# Patient Record
Sex: Female | Born: 1955
Health system: Southern US, Community
[De-identification: ages and names within clinical notes are randomized; demographics above are authoritative.]

## PROBLEM LIST (undated history)

## (undated) DIAGNOSIS — L709 Acne, unspecified: Secondary | ICD-10-CM

## (undated) DIAGNOSIS — E049 Nontoxic goiter, unspecified: Secondary | ICD-10-CM

## (undated) DIAGNOSIS — M1990 Unspecified osteoarthritis, unspecified site: Secondary | ICD-10-CM

## (undated) DIAGNOSIS — J449 Chronic obstructive pulmonary disease, unspecified: Secondary | ICD-10-CM

## (undated) DIAGNOSIS — N281 Cyst of kidney, acquired: Secondary | ICD-10-CM

## (undated) DIAGNOSIS — E079 Disorder of thyroid, unspecified: Secondary | ICD-10-CM

## (undated) DIAGNOSIS — M8580 Other specified disorders of bone density and structure, unspecified site: Secondary | ICD-10-CM

## (undated) DIAGNOSIS — F419 Anxiety disorder, unspecified: Secondary | ICD-10-CM

## (undated) DIAGNOSIS — J439 Emphysema, unspecified: Secondary | ICD-10-CM

## (undated) DIAGNOSIS — F32A Depression, unspecified: Secondary | ICD-10-CM

## (undated) DIAGNOSIS — F17200 Nicotine dependence, unspecified, uncomplicated: Secondary | ICD-10-CM

## (undated) DIAGNOSIS — T7840XA Allergy, unspecified, initial encounter: Secondary | ICD-10-CM

## (undated) DIAGNOSIS — C4490 Unspecified malignant neoplasm of skin, unspecified: Secondary | ICD-10-CM

## (undated) HISTORY — DX: Cyst of kidney, acquired: N28.1

## (undated) HISTORY — DX: Acne, unspecified: L70.9

## (undated) HISTORY — DX: Allergy, unspecified, initial encounter: T78.40XA

## (undated) HISTORY — PX: EXCISION MASS UPPER EXTREMETIES: SHX6704

## (undated) HISTORY — DX: Unspecified malignant neoplasm of skin, unspecified: C44.90

## (undated) HISTORY — PX: HERNIA REPAIR: SHX51

## (undated) HISTORY — DX: Nontoxic goiter, unspecified: E04.9

## (undated) HISTORY — DX: Depression, unspecified: F32.A

## (undated) HISTORY — DX: Other specified disorders of bone density and structure, unspecified site: M85.80

## (undated) HISTORY — DX: Nicotine dependence, unspecified, uncomplicated: F17.200

## (undated) HISTORY — DX: Chronic obstructive pulmonary disease, unspecified: J44.9

## (undated) HISTORY — DX: Disorder of thyroid, unspecified: E07.9

## (undated) HISTORY — DX: Anxiety disorder, unspecified: F41.9

## (undated) HISTORY — DX: Emphysema, unspecified: J43.9

## (undated) HISTORY — DX: Unspecified osteoarthritis, unspecified site: M19.90

---

## 1976-09-07 HISTORY — PX: BUNIONECTOMY: SHX129

## 1991-09-08 HISTORY — PX: TUBAL LIGATION: SHX77

## 1992-09-07 HISTORY — PX: HEMORRHOID SURGERY: SHX153

## 2000-09-30 ENCOUNTER — Encounter: Admission: RE | Admit: 2000-09-30 | Discharge: 2000-09-30 | Payer: Self-pay | Admitting: Internal Medicine

## 2000-09-30 ENCOUNTER — Encounter: Payer: Self-pay | Admitting: Internal Medicine

## 2001-10-17 ENCOUNTER — Encounter: Admission: RE | Admit: 2001-10-17 | Discharge: 2001-10-17 | Payer: Self-pay | Admitting: Cardiology

## 2001-10-17 ENCOUNTER — Encounter: Payer: Self-pay | Admitting: Cardiology

## 2005-01-30 ENCOUNTER — Encounter: Admission: RE | Admit: 2005-01-30 | Discharge: 2005-01-30 | Payer: Self-pay | Admitting: Cardiology

## 2011-02-07 ENCOUNTER — Encounter: Payer: Self-pay | Admitting: Emergency Medicine

## 2011-02-07 ENCOUNTER — Inpatient Hospital Stay (INDEPENDENT_AMBULATORY_CARE_PROVIDER_SITE_OTHER)
Admission: RE | Admit: 2011-02-07 | Discharge: 2011-02-07 | Disposition: A | Discharge status: Home / Self Care | Payer: BC Managed Care – PPO | Source: Ambulatory Visit | Attending: Emergency Medicine | Admitting: Emergency Medicine

## 2011-02-07 DIAGNOSIS — T7840XA Allergy, unspecified, initial encounter: Secondary | ICD-10-CM | POA: Insufficient documentation

## 2011-02-09 ENCOUNTER — Telehealth (INDEPENDENT_AMBULATORY_CARE_PROVIDER_SITE_OTHER): Payer: Self-pay | Admitting: Emergency Medicine

## 2011-05-07 ENCOUNTER — Encounter: Payer: Self-pay | Admitting: Internal Medicine

## 2011-05-07 ENCOUNTER — Ambulatory Visit (INDEPENDENT_AMBULATORY_CARE_PROVIDER_SITE_OTHER): Payer: BC Managed Care – PPO | Admitting: Internal Medicine

## 2011-05-07 ENCOUNTER — Ambulatory Visit (HOSPITAL_BASED_OUTPATIENT_CLINIC_OR_DEPARTMENT_OTHER)
Admission: RE | Admit: 2011-05-07 | Discharge: 2011-05-07 | Disposition: A | Discharge status: Home / Self Care | Payer: BC Managed Care – PPO | Source: Ambulatory Visit | Attending: Internal Medicine | Admitting: Internal Medicine

## 2011-05-07 DIAGNOSIS — Z87891 Personal history of nicotine dependence: Secondary | ICD-10-CM | POA: Insufficient documentation

## 2011-05-07 DIAGNOSIS — F32A Depression, unspecified: Secondary | ICD-10-CM

## 2011-05-07 DIAGNOSIS — F418 Other specified anxiety disorders: Secondary | ICD-10-CM | POA: Insufficient documentation

## 2011-05-07 DIAGNOSIS — L709 Acne, unspecified: Secondary | ICD-10-CM | POA: Insufficient documentation

## 2011-05-07 DIAGNOSIS — E042 Nontoxic multinodular goiter: Secondary | ICD-10-CM

## 2011-05-07 DIAGNOSIS — F419 Anxiety disorder, unspecified: Secondary | ICD-10-CM | POA: Insufficient documentation

## 2011-05-07 DIAGNOSIS — Z720 Tobacco use: Secondary | ICD-10-CM | POA: Insufficient documentation

## 2011-05-07 DIAGNOSIS — M8580 Other specified disorders of bone density and structure, unspecified site: Secondary | ICD-10-CM | POA: Insufficient documentation

## 2011-05-07 DIAGNOSIS — E039 Hypothyroidism, unspecified: Secondary | ICD-10-CM

## 2011-05-07 DIAGNOSIS — F329 Major depressive disorder, single episode, unspecified: Secondary | ICD-10-CM

## 2011-05-07 DIAGNOSIS — E049 Nontoxic goiter, unspecified: Secondary | ICD-10-CM

## 2011-05-07 LAB — CBC WITH DIFFERENTIAL/PLATELET
Basophils Absolute: 0 10*3/uL (ref 0.0–0.1)
Basophils Relative: 0 % (ref 0–1)
Eosinophils Absolute: 0.1 10*3/uL (ref 0.0–0.7)
Eosinophils Relative: 1 % (ref 0–5)
HCT: 41.4 % (ref 36.0–46.0)
Hemoglobin: 14.2 g/dL (ref 12.0–15.0)
Lymphocytes Relative: 32 % (ref 12–46)
Lymphs Abs: 2.6 10*3/uL (ref 0.7–4.0)
MCH: 30.5 pg (ref 26.0–34.0)
MCHC: 34.3 g/dL (ref 30.0–36.0)
MCV: 89 fL (ref 78.0–100.0)
Monocytes Absolute: 0.6 10*3/uL (ref 0.1–1.0)
Monocytes Relative: 7 % (ref 3–12)
Neutro Abs: 4.9 10*3/uL (ref 1.7–7.7)
Neutrophils Relative %: 59 % (ref 43–77)
Platelets: 201 10*3/uL (ref 150–400)
RBC: 4.65 MIL/uL (ref 3.87–5.11)
RDW: 13.2 % (ref 11.5–15.5)
WBC: 8.3 10*3/uL (ref 4.0–10.5)

## 2011-05-07 LAB — COMPREHENSIVE METABOLIC PANEL
ALT: 9 U/L (ref 0–35)
CO2: 24 mEq/L (ref 19–32)
Calcium: 9.7 mg/dL (ref 8.4–10.5)
Chloride: 105 mEq/L (ref 96–112)
Sodium: 143 mEq/L (ref 135–145)
Total Protein: 6.6 g/dL (ref 6.0–8.3)

## 2011-05-07 LAB — TSH: TSH: 0.376 u[IU]/mL (ref 0.350–4.500)

## 2011-05-07 NOTE — Patient Instructions (Signed)
To Xray for ultrasound today  Take medicine as prescribed  Call if any nausea or diarrhea or side effects or worsening symptoms  See me inoffice in 2-3 months

## 2011-05-07 NOTE — Progress Notes (Signed)
Subjective:    Patient ID: Taylor Atkins, female    DOB: 06-18-1956, 55 y.o.   MRN: [id number]  HPI  New pt here for first visit.   Former pt of [facility] was seen by a PA there.  Has also seen Dr. Vernell Barrier for acne and and endocrinologist Dr. Earlene Plater in [facility] for multinodular goiter.  Her last ultrasound was a couple of years ago to her memory.   She is very tearful during interview  She reports two issues.  In early August she removed a tick for her occipital scalp and had a few days of lymph node swelling in her neck that resolved.  No joint symptoms, no rash, no headache.  She did not travel anywhere except the beach this summer.  I advised testing for LYme disease but pt declines now.  Also has noticed depressed mood on a daily basis for the last several weeks.  Works as a Tree surgeon at [employer].  Work is very stressful as she is down two employees.  Also stress with elderly mother who is alone and has no money.  Mother does not live with her now.  She describes after work shehas no motivation to do anything,.  She is very tired, no sex drive, no suicidal or homicidal ideation.  Has been on Lexapro in the past but it made her feel funny.  She declines counseling at this point.  Mother has history of depression.  Pt never  Been hospitalized in the past  Uses nightly Temazepam for sleep for years.  She describes early menopause at age 44  No Known Allergies Past Medical History  Diagnosis Date  . Anxiety   . Osteopenia   . Thyroid disease   . Goiter   . Tobacco use disorder   . Acne    Past Surgical History  Procedure Date  . Cesarean section 1990, 1993  . Bunionectomy 1978  . Tubal ligation 1993  . Hemorrhoid surgery 1994   History   Social History  . Marital Status: Married    Spouse Name: N/A    Number of Children: N/A  . Years of Education: N/A   Occupational History  . Not on file.   Social History Main Topics  . Smoking status: Current  Everyday Smoker -- 1.0 packs/day for 40 years    Types: Cigarettes  . Smokeless tobacco: Never Used  . Alcohol Use: No  . Drug Use: No  . Sexually Active: Yes   Other Topics Concern  . Not on file   Social History Narrative  . No narrative on file   Family History  Problem Relation Age of Onset  . Hypertension Mother   . Heart disease Father     Afib, pacemaker  . Hypertension Father   . Diabetes Brother   . Hypertension Brother   . Diabetes Brother   . Hypertension Brother    Patient Active Problem List  Diagnoses  . Anxiety  . Osteopenia  . Thyroid disease  . Goiter  . Tobacco use disorder  . Acne   No current outpatient prescriptions on file prior to visit.         Review of Systems    See HPI Objective:   Physical Exam Physical Exam  Nursing note and vitals reviewed.  Constitutional: She is oriented to person, place, and time. She appears well-developed and well-nourished.  HENT:  Head: Normocephalic and atraumatic.  Neck :  Small L thyroid nodule palpated Cardiovascular:  Normal rate and regular rhythm. Exam reveals no gallop and no friction rub.  No murmur heard.  Pulmonary/Chest: Breath sounds normal. She has no wheezes. She has no rales.  Neurological: She is alert and oriented to person, place, and time.  Skin: Skin is warm and dry.  Psychiatric: Very tearful during interview. Her behavior is normal.        Assessment & Plan:  1)  Depression  :  She declines counseling now.  Will try Zoloft 25 mg for 7 days, then 50 mg.  Recheck 2-3 months.  Pt aware if any worsening depression to call for OV 2)  History of MNG  Will get ultrasound today 3)  Tobacco Use Not interested in cessation now 4)  Osteopenia 5)  Anxiety per pt history 6)  Recent tick exposure :  Pt declines testing for Lyme now but counseled if any joint swelling, redness or rashes or headaches to return for OV She voices understanding

## 2011-05-12 ENCOUNTER — Telehealth: Payer: Self-pay | Admitting: Internal Medicine

## 2011-05-12 ENCOUNTER — Encounter: Payer: Self-pay | Admitting: Emergency Medicine

## 2011-05-12 DIAGNOSIS — E041 Nontoxic single thyroid nodule: Secondary | ICD-10-CM

## 2011-05-12 MED ORDER — SERTRALINE HCL 25 MG PO TABS: ORAL_TABLET | ORAL | Status: DC

## 2011-05-12 NOTE — Telephone Encounter (Signed)
Spoke with pt and informed of thyroid ultrasound results.  Will set up FNA with radiology.   She voices understanding

## 2011-05-12 NOTE — Progress Notes (Signed)
Taylor Atkins is scheduled for thyroid biopsy 05/19/11 @ 400pm at [facility].  Pt aware per Kem Parkinson, rn

## 2011-05-12 NOTE — Progress Notes (Signed)
Addended by: Chip Boer on: 05/12/2011 08:49 AM   Modules accepted: Orders

## 2011-05-19 ENCOUNTER — Other Ambulatory Visit (HOSPITAL_COMMUNITY)
Admission: RE | Admit: 2011-05-19 | Discharge: 2011-05-19 | Disposition: A | Discharge status: Home / Self Care | Payer: BC Managed Care – PPO | Source: Ambulatory Visit | Attending: Diagnostic Radiology | Admitting: Diagnostic Radiology

## 2011-05-19 ENCOUNTER — Ambulatory Visit
Admission: RE | Admit: 2011-05-19 | Discharge: 2011-05-19 | Disposition: A | Discharge status: Home / Self Care | Payer: BC Managed Care – PPO | Source: Ambulatory Visit | Attending: Internal Medicine | Admitting: Internal Medicine

## 2011-05-19 ENCOUNTER — Other Ambulatory Visit: Payer: Self-pay | Admitting: Internal Medicine

## 2011-05-19 DIAGNOSIS — E041 Nontoxic single thyroid nodule: Secondary | ICD-10-CM

## 2011-05-19 DIAGNOSIS — E049 Nontoxic goiter, unspecified: Secondary | ICD-10-CM | POA: Insufficient documentation

## 2011-05-21 ENCOUNTER — Telehealth: Payer: Self-pay | Admitting: Internal Medicine

## 2011-05-21 NOTE — Telephone Encounter (Signed)
Spoke with pt and informed of thyroid BS results of non-neoplastic goiter.  She is counseled to make follow up appt with me. She voices understanding

## 2011-05-29 ENCOUNTER — Encounter: Payer: Self-pay | Admitting: Emergency Medicine

## 2011-06-21 ENCOUNTER — Encounter: Payer: Self-pay | Admitting: Internal Medicine

## 2011-07-13 ENCOUNTER — Encounter: Payer: Self-pay | Admitting: Internal Medicine

## 2011-07-13 ENCOUNTER — Ambulatory Visit (INDEPENDENT_AMBULATORY_CARE_PROVIDER_SITE_OTHER): Payer: BC Managed Care – PPO | Admitting: Internal Medicine

## 2011-07-13 DIAGNOSIS — F32A Depression, unspecified: Secondary | ICD-10-CM

## 2011-07-13 DIAGNOSIS — F329 Major depressive disorder, single episode, unspecified: Secondary | ICD-10-CM

## 2011-07-13 DIAGNOSIS — F17200 Nicotine dependence, unspecified, uncomplicated: Secondary | ICD-10-CM

## 2011-07-13 DIAGNOSIS — F3289 Other specified depressive episodes: Secondary | ICD-10-CM

## 2011-07-13 DIAGNOSIS — E079 Disorder of thyroid, unspecified: Secondary | ICD-10-CM

## 2011-07-13 NOTE — Progress Notes (Signed)
Subjective:    Patient ID: Taylor Atkins, female    DOB: 02-14-1956, 55 y.o.   MRN: [id number]  HPI [place] is here to follow up on several issues.  SEe path report of thyroid bx.  Neg for malignancy .   She reports that she is 75% improved with Zoloft 50 mg.  Less irritable,  Less depressed mood.  She does not wish to see a counselor at this point  She reports she had recent cough with wheezing . Was given a Zpack with albuterol by one of her MD's as [facility] cardiology.  Wheezing is gone and cough is less  No fever.  She is taking Mucinex for congestion.  She does continue to smoke despite multiple attempts at quitting  She has some hair falling out but this is not new to her and she does not wish any testing for it.  Notices when she combs or pulls on her hair  She has some increased sweating of palms and soles of feet.    No Known Allergies Past Medical History  Diagnosis Date  . Anxiety   . Osteopenia   . Thyroid disease   . Goiter   . Tobacco use disorder   . Acne    Past Surgical History  Procedure Date  . Cesarean section 1990, 1993  . Bunionectomy 1978  . Tubal ligation 1993  . Hemorrhoid surgery 1994   History   Social History  . Marital Status: Married    Spouse Name: N/A    Number of Children: N/A  . Years of Education: N/A   Occupational History  . Not on file.   Social History Main Topics  . Smoking status: Current Everyday Smoker -- 1.0 packs/day for 40 years    Types: Cigarettes  . Smokeless tobacco: Never Used  . Alcohol Use: No  . Drug Use: No  . Sexually Active: Yes   Other Topics Concern  . Not on file   Social History Narrative  . No narrative on file   Family History  Problem Relation Age of Onset  . Hypertension Mother   . Heart disease Father     Afib, pacemaker  . Hypertension Father   . Diabetes Brother   . Hypertension Brother   . Diabetes Brother   . Hypertension Brother    Patient Active Problem List  Diagnoses  .  Anxiety  . Osteopenia  . Thyroid disease  . Goiter  . Tobacco use disorder  . Acne   Current Outpatient Prescriptions on File Prior to Visit  Medication Sig Dispense Refill  . clindamycin (CLEOCIN T) 1 % external solution Apply 1 application topically 2 (two) times daily.        . sertraline (ZOLOFT) 25 MG tablet 1 tab at bedtime x 7 days, then 2 tabs at bedtime  60 tablet  2  . temazepam (RESTORIL) 15 MG capsule Take 15 mg by mouth at bedtime as needed. 1-2 caps po QHS            Review of Systems See HPI    Objective:   Physical Exam Physical Exam  Nursing note and vitals reviewed.  Constitutional: She is oriented to person, place, and time. She appears well-developed and well-nourished.  HENT:  Head: Normocephalic and atraumatic.  Cardiovascular: Normal rate and regular rhythm. Exam reveals no gallop and no friction rub.  No murmur heard.  Pulmonary/Chest: Breath sounds normal. She has no wheezes. She has no rales.   Nice clear  lung sounds today.   Neurological: She is alert and oriented to person, place, and time.  Skin: Skin is warm and dry.  Psychiatric: She has a normal mood and affect. Her behavior is normal.         Assessment & Plan:  1)  Depression:  Will continue Zoloft 50 mg daily  Reasses in 6 months to one year 2)  Recent URI:  Given smoking history I offered to do CXR today but she declines.  She will get flu vaccine at work 3)  Tobacco use  Will discuss CXR at CPE appt 4)  Hair loss  multipfactorial  Age ,  Post menopause  She is not concerned at this point 5)  Hyperhidrosis palms soles of  Feet:  ???  SSRI related.  Schedule cpe

## 2011-07-13 NOTE — Patient Instructions (Signed)
Continue Zoloft 50 mg daily  CPE in 2013

## 2011-08-04 ENCOUNTER — Other Ambulatory Visit: Payer: Self-pay | Admitting: Internal Medicine

## 2011-08-04 DIAGNOSIS — F419 Anxiety disorder, unspecified: Secondary | ICD-10-CM

## 2011-08-10 NOTE — Progress Notes (Signed)
Summary: FACIAL AND NECK ALLERGIC REACTION(RM5)   Vital Signs:  Patient Profile:   55 Years Old Female CC:      swelling of eyes/ tingling of lips Height:     63 inches Weight:      118.8 pounds O2 Sat:      97 % O2 treatment:    Room Air Temp:     98.4 degrees F oral Pulse rate:   79 / minute Resp:     20 per minute BP sitting:   121 / 73  (left arm) Cuff size:   regular  Vitals Entered By: Linton Flemings RN (February 07, 2011 11:34 AM)                  Updated Prior Medication List: TEMAZEPAM 30 MG CAPS (TEMAZEPAM) at night  Current Allergies: No known allergies History of Present Illness History from: patient Chief Complaint: swelling of eyes/ tingling of lips History of Present Illness: Over the past few days, she has been having L then R eye swelling, L neck swelling, lip tingling, and some other itching.  No SOB, CP, dyspnea.  Cannot recall any new soaps, shampoos, foods, makeup, cleaning products, etc.  She has had an allergic reaction in the past but that one including a skin rash.  No bug bites, etc.  REVIEW OF SYSTEMS Constitutional Symptoms      Denies fever, chills, night sweats, weight loss, weight gain, and fatigue.  Eyes       Complains of glasses.      Denies change in vision, eye pain, eye discharge, contact lenses, and eye surgery. Ear/Nose/Throat/Mouth       Denies hearing loss/aids, change in hearing, ear pain, ear discharge, dizziness, frequent runny nose, frequent nose bleeds, sinus problems, sore throat, hoarseness, and tooth pain or bleeding.  Respiratory       Denies dry cough, productive cough, wheezing, shortness of breath, asthma, bronchitis, and emphysema/COPD.  Cardiovascular       Denies murmurs, chest pain, and tires easily with exhertion.    Gastrointestinal       Denies stomach pain, nausea/vomiting, diarrhea, constipation, blood in bowel movements, and indigestion. Genitourniary       Denies painful urination, kidney stones, and loss  of urinary control. Neurological       Complains of numbness and tingling.      Denies paralysis, seizures, and fainting/blackouts. Musculoskeletal       Complains of redness and swelling.      Denies muscle pain, joint pain, joint stiffness, decreased range of motion, muscle weakness, and gout.  Skin       Denies bruising, unusual mles/lumps or sores, and hair/skin or nail changes.  Psych       Complains of anxiety/stress.      Denies mood changes, temper/anger issues, speech problems, depression, and sleep problems. Other Comments: SWELLING TO EYES STARTED 3 DAYS AGO AND NOW HAVING SWELLING TO LEFT SIDE OF NECK.  ALSO STATES ON THE WAY OVER HER NOTICED TINGLING TO LIPS.  STATES SHE WAS GETTING NERVOUS ON THE WAY HERE.   Past History:  Past Medical History: Unremarkable  Past Surgical History: c-section 1990/1993 bunionectomy 1985 Tubal ligation 1994  Family History: Family History Hypertension Family History Diabetes 1st degree relative AFIB- FATHER Physical Exam General appearance: well developed, well nourished, no acute distress Eyes: PERRLA no swelling noticed Ears: normal, no lesions or deformities Nasal: mucosa pink, nonedematous, no septal deviation, turbinates normal Oral/Pharynx: tongue normal,  posterior pharynx without erythema or exudate.  OP widely patent Neck: neck supple,  trachea midline, no masses Skin: no obvious rashes or lesions MSE: oriented to time, place, and person Assessment New Problems: ALLERGIC REACTION (ICD-995.3) FAMILY HISTORY DIABETES 1ST DEGREE RELATIVE (ICD-V18.0)   Patient Education: Patient and/or caregiver instructed in the following: rest, fluids.  Plan New Medications/Changes: PREDNISONE (PAK) 10 MG TABS (PREDNISONE) 6 day pack, use as directed  #1 x 0, 02/07/2011, Hoyt Koch MD  New Orders: New Patient Level II [99202] Pulse Oximetry (single measurment) [94760] Planning Comments:   I don't necessarilyl see any  physical finding c/w allergic reaction, however her symptoms I believe warrant treatment.  Will place on a pred pack taper + OTC antihistamine two times a day.  If worsening symptoms, SOB, etc, she should go to the ER.   The patient and/or caregiver has been counseled thoroughly with regard to medications prescribed including dosage, schedule, interactions, rationale for use, and possible side effects and they verbalize understanding.  Diagnoses and expected course of recovery discussed and will return if not improved as expected or if the condition worsens. Patient and/or caregiver verbalized understanding.  Prescriptions: PREDNISONE (PAK) 10 MG TABS (PREDNISONE) 6 day pack, use as directed  #1 x 0   Entered and Authorized by:   Hoyt Koch MD   Signed by:   Hoyt Koch MD on 02/07/2011   Method used:   Print then Give to Patient   RxID:   [id number]   Orders Added: 1)  New Patient Level II [99202] 2)  Pulse Oximetry (single measurment) [78469]

## 2011-08-10 NOTE — Telephone Encounter (Signed)
  Phone Note Outgoing Call Call back at [facility] Phone [phone] [facility]     Call placed by: Emilio Math,  February 09, 2011 2:06 PM Call placed to: Patient Summary of Call: Left msg, hope she's better call with any questions or concerns

## 2011-10-19 ENCOUNTER — Other Ambulatory Visit: Payer: Self-pay | Admitting: Emergency Medicine

## 2011-10-19 DIAGNOSIS — G4700 Insomnia, unspecified: Secondary | ICD-10-CM

## 2011-10-19 MED ORDER — TEMAZEPAM 15 MG PO CAPS: 15.0000 mg | ORAL_CAPSULE | Freq: Every evening | ORAL | Status: DC | PRN

## 2011-10-19 NOTE — Telephone Encounter (Signed)
Refill request received from [facility] for pt's Temazepam.  Last filled 08/20/11, #60.

## 2011-10-19 NOTE — Telephone Encounter (Signed)
Called refill to physician voicemail at [facility]

## 2011-11-12 ENCOUNTER — Ambulatory Visit (INDEPENDENT_AMBULATORY_CARE_PROVIDER_SITE_OTHER): Payer: BC Managed Care – PPO | Admitting: Internal Medicine

## 2011-11-12 ENCOUNTER — Encounter: Payer: Self-pay | Admitting: Internal Medicine

## 2011-11-12 VITALS — BP 120/60 | HR 68 | Temp 97.6°F | Ht 63.0 in | Wt 114.0 lb

## 2011-11-12 DIAGNOSIS — L709 Acne, unspecified: Secondary | ICD-10-CM

## 2011-11-12 DIAGNOSIS — F17200 Nicotine dependence, unspecified, uncomplicated: Secondary | ICD-10-CM

## 2011-11-12 DIAGNOSIS — F32A Depression, unspecified: Secondary | ICD-10-CM

## 2011-11-12 DIAGNOSIS — F329 Major depressive disorder, single episode, unspecified: Secondary | ICD-10-CM

## 2011-11-12 DIAGNOSIS — L708 Other acne: Secondary | ICD-10-CM

## 2011-11-12 DIAGNOSIS — G4700 Insomnia, unspecified: Secondary | ICD-10-CM

## 2011-11-12 MED ORDER — TEMAZEPAM 15 MG PO CAPS: 15.0000 mg | ORAL_CAPSULE | Freq: Every evening | ORAL | Status: DC | PRN

## 2011-11-12 MED ORDER — SERTRALINE HCL 50 MG PO TABS: 50.0000 mg | ORAL_TABLET | Freq: Every day | ORAL | Status: DC

## 2011-11-12 NOTE — Patient Instructions (Signed)
Make complete physical appointment with me  Call if dermatology appointment needed

## 2011-11-12 NOTE — Progress Notes (Signed)
Subjective:    Patient ID: Taylor Atkins, female    DOB: Nov 30, 1955, 56 y.o.   MRN: [id number]  HPI  Taylor Atkins is here for follow up.  Doing well Tolerating sertraline well .  Stress at work is improved but still there and still has stresses at home.  Would like to continue Zoloft.  Had been on Lexapro in the past but could not tolerate.  FH aunt with depression.  She feels her depression is mostly situational  She is concerned about continued acne breakouts despite using Cleocin T  She would like to try a new dermatologist  No Known Allergies Past Medical History  Diagnosis Date  . Anxiety   . Osteopenia   . Thyroid disease   . Goiter   . Tobacco use disorder   . Acne    Past Surgical History  Procedure Date  . Cesarean section 1990, 1993  . Bunionectomy 1978  . Tubal ligation 1993  . Hemorrhoid surgery 1994   History   Social History  . Marital Status: Married    Spouse Name: N/A    Number of Children: N/A  . Years of Education: N/A   Occupational History  . Not on file.   Social History Main Topics  . Smoking status: Current Everyday Smoker -- 1.0 packs/day for 40 years    Types: Cigarettes  . Smokeless tobacco: Never Used  . Alcohol Use: No  . Drug Use: No  . Sexually Active: Yes   Other Topics Concern  . Not on file   Social History Narrative  . No narrative on file   Family History  Problem Relation Age of Onset  . Hypertension Mother   . Heart disease Father     Afib, pacemaker  . Hypertension Father   . Diabetes Brother   . Hypertension Brother   . Diabetes Brother   . Hypertension Brother    Patient Active Problem List  Diagnoses  . Anxiety  . Osteopenia  . Thyroid disease  . Goiter  . Tobacco use disorder  . Acne  . Depression  . ALLERGIC REACTION   Current Outpatient Prescriptions on File Prior to Visit  Medication Sig Dispense Refill  . clindamycin (CLEOCIN T) 1 % external solution Apply 1 application topically 2 (two) times  daily.        . sertraline (ZOLOFT) 25 MG tablet TAKE 1 TABLET BY MOUTH AT BEDTIME FOR 7 DAYS. THEN TAKE 2 TABLETS ATBEDTIME  60 tablet  3  . temazepam (RESTORIL) 15 MG capsule Take 1 capsule (15 mg total) by mouth at bedtime as needed. 1-2 caps po QHS  60 capsule  0      Review of Systems See HPI    Objective:   Physical Exam Physical Exam  Nursing note and vitals reviewed.  Constitutional: She is oriented to person, place, and time. She appears well-developed and well-nourished.  HENT:  Head: Normocephalic and atraumatic.  Cardiovascular: Normal rate and regular rhythm. Exam reveals no gallop and no friction rub.  No murmur heard.  Pulmonary/Chest: Breath sounds normal. She has no wheezes. She has no rales.  Neurological: She is alert and oriented to person, place, and time.  Skin: Skin is warm and dry.  Psychiatric: She has a normal mood and affect. Her behavior is normal.       Assessment & Plan:  1)  Situational derpession  Will continue Zoloft for now 2)  Acne  I gave [place] to [facility] [place].  She wishes to call to make her own appt 3) Tobacco use  Not ready for cessation now despite my advice to do so  Will schedule CPE [place]um[place]

## 2011-12-27 ENCOUNTER — Other Ambulatory Visit: Payer: Self-pay | Admitting: Internal Medicine

## 2011-12-28 ENCOUNTER — Telehealth: Payer: Self-pay | Admitting: Internal Medicine

## 2011-12-28 MED ORDER — TEMAZEPAM 15 MG PO CAPS: ORAL_CAPSULE | ORAL | Status: DC

## 2011-12-28 NOTE — Telephone Encounter (Addendum)
Called [facility] to amend the original prescription from dispense 30 to 60 with 1 refill, may take 2 at hs prn.  rx called in to [facility] pharmacy

## 2011-12-28 NOTE — Telephone Encounter (Signed)
Temazepam (Cap) RESTORIL 15 MG TAKE 1-2 CAPSULE AT BEDTIME AS NEEDED FOR SLEEP pt needs medication  to go [employer] off of [address]. She states she needs it to say 2 capsule at bedtime; with the prescription written as is now she only have a 15 day supply. Pt states there is no refill on the medication either. Please call pt [phone].

## 2011-12-28 NOTE — Telephone Encounter (Signed)
Called patient to notify we have called in prescription change, she states she had been in over weekend to request refill and wanted 60 but they told her to call.

## 2011-12-28 NOTE — Telephone Encounter (Signed)
Dr. Constance Goltz, I called this in earlier before I left your clinic.

## 2012-02-29 ENCOUNTER — Other Ambulatory Visit: Payer: Self-pay | Admitting: Internal Medicine

## 2012-02-29 NOTE — Telephone Encounter (Signed)
Per Dr Constance Goltz, RX for Restoril 15mg  # 60 called into [facility]. NR.

## 2012-04-07 ENCOUNTER — Telehealth: Payer: Self-pay | Admitting: Internal Medicine

## 2012-04-07 ENCOUNTER — Other Ambulatory Visit: Payer: Self-pay | Admitting: Internal Medicine

## 2012-04-07 NOTE — Telephone Encounter (Signed)
Pt needs refill on Temazepam (Cap) RESTORIL 15 MG TAKE 1 TO 2 CAPSULES AT BEDTIME IF NEEDED FOR SLEEP    Please call if there are any concerns [phone]... Ad   Sorry if I sent this again... Could  Not remember if I sent it before... Ad

## 2012-04-07 NOTE — Telephone Encounter (Signed)
RX called to [facility] for Restoril per Dr Constance Goltz.

## 2012-04-07 NOTE — Telephone Encounter (Signed)
Pt needs refill of Temazepam (Cap) RESTORIL 15 MG TAKE 1 TO 2 CAPSULES AT BEDTIME IF NEEDED FOR SLEEP ... She states she does not have any... Please call if there are any questions; appointment in Nov [phone]

## 2012-05-18 ENCOUNTER — Other Ambulatory Visit: Payer: Self-pay | Admitting: Internal Medicine

## 2012-05-19 ENCOUNTER — Telehealth: Payer: Self-pay | Admitting: *Deleted

## 2012-05-25 NOTE — Telephone Encounter (Signed)
Called in RX 

## 2012-06-24 ENCOUNTER — Encounter: Payer: Self-pay | Admitting: *Deleted

## 2012-07-26 ENCOUNTER — Ambulatory Visit (HOSPITAL_BASED_OUTPATIENT_CLINIC_OR_DEPARTMENT_OTHER)
Admission: RE | Admit: 2012-07-26 | Discharge: 2012-07-26 | Disposition: A | Discharge status: Home / Self Care | Payer: 59 | Source: Ambulatory Visit | Attending: Internal Medicine | Admitting: Internal Medicine

## 2012-07-26 ENCOUNTER — Encounter: Payer: Self-pay | Admitting: Internal Medicine

## 2012-07-26 ENCOUNTER — Ambulatory Visit (INDEPENDENT_AMBULATORY_CARE_PROVIDER_SITE_OTHER): Payer: 59 | Admitting: Internal Medicine

## 2012-07-26 VITALS — BP 130/70 | HR 80 | Temp 98.3°F | Resp 18 | Ht 63.0 in | Wt 119.0 lb

## 2012-07-26 DIAGNOSIS — Z124 Encounter for screening for malignant neoplasm of cervix: Secondary | ICD-10-CM

## 2012-07-26 DIAGNOSIS — E079 Disorder of thyroid, unspecified: Secondary | ICD-10-CM

## 2012-07-26 DIAGNOSIS — J438 Other emphysema: Secondary | ICD-10-CM | POA: Insufficient documentation

## 2012-07-26 DIAGNOSIS — F411 Generalized anxiety disorder: Secondary | ICD-10-CM

## 2012-07-26 DIAGNOSIS — Z0000 Encounter for general adult medical examination without abnormal findings: Secondary | ICD-10-CM

## 2012-07-26 DIAGNOSIS — R05 Cough: Secondary | ICD-10-CM

## 2012-07-26 DIAGNOSIS — R053 Chronic cough: Secondary | ICD-10-CM

## 2012-07-26 DIAGNOSIS — J449 Chronic obstructive pulmonary disease, unspecified: Secondary | ICD-10-CM

## 2012-07-26 DIAGNOSIS — E049 Nontoxic goiter, unspecified: Secondary | ICD-10-CM

## 2012-07-26 DIAGNOSIS — J988 Other specified respiratory disorders: Secondary | ICD-10-CM

## 2012-07-26 DIAGNOSIS — Z1151 Encounter for screening for human papillomavirus (HPV): Secondary | ICD-10-CM

## 2012-07-26 DIAGNOSIS — I4949 Other premature depolarization: Secondary | ICD-10-CM

## 2012-07-26 DIAGNOSIS — I493 Ventricular premature depolarization: Secondary | ICD-10-CM

## 2012-07-26 DIAGNOSIS — Z87442 Personal history of urinary calculi: Secondary | ICD-10-CM | POA: Insufficient documentation

## 2012-07-26 DIAGNOSIS — Z139 Encounter for screening, unspecified: Secondary | ICD-10-CM

## 2012-07-26 DIAGNOSIS — F419 Anxiety disorder, unspecified: Secondary | ICD-10-CM

## 2012-07-26 DIAGNOSIS — R059 Cough, unspecified: Secondary | ICD-10-CM

## 2012-07-26 DIAGNOSIS — R0989 Other specified symptoms and signs involving the circulatory and respiratory systems: Secondary | ICD-10-CM

## 2012-07-26 DIAGNOSIS — F17200 Nicotine dependence, unspecified, uncomplicated: Secondary | ICD-10-CM | POA: Insufficient documentation

## 2012-07-26 DIAGNOSIS — R319 Hematuria, unspecified: Secondary | ICD-10-CM

## 2012-07-26 LAB — CBC WITH DIFFERENTIAL/PLATELET
Basophils Absolute: 0 10*3/uL (ref 0.0–0.1)
Basophils Relative: 0 % (ref 0–1)
Eosinophils Relative: 2 % (ref 0–5)
HCT: 38.8 % (ref 36.0–46.0)
Lymphocytes Relative: 39 % (ref 12–46)
MCH: 30.2 pg (ref 26.0–34.0)
MCHC: 34.5 g/dL (ref 30.0–36.0)
MCV: 87.4 fL (ref 78.0–100.0)
Monocytes Absolute: 0.5 10*3/uL (ref 0.1–1.0)
RDW: 13.8 % (ref 11.5–15.5)

## 2012-07-26 LAB — COMPREHENSIVE METABOLIC PANEL
Albumin: 4.7 g/dL (ref 3.5–5.2)
CO2: 30 mEq/L (ref 19–32)
Glucose, Bld: 90 mg/dL (ref 70–99)
Sodium: 143 mEq/L (ref 135–145)
Total Bilirubin: 0.4 mg/dL (ref 0.3–1.2)
Total Protein: 6.7 g/dL (ref 6.0–8.3)

## 2012-07-26 LAB — LIPID PANEL
Cholesterol: 171 mg/dL (ref 0–200)
HDL: 51 mg/dL (ref >39)
Triglycerides: 58 mg/dL (ref <150)

## 2012-07-26 LAB — POCT URINALYSIS DIPSTICK
Ketones, UA: NEGATIVE
Leukocytes, UA: NEGATIVE
Nitrite, UA: NEGATIVE
Protein, UA: NEGATIVE
Urobilinogen, UA: NEGATIVE

## 2012-07-26 MED ORDER — TEMAZEPAM 15 MG PO CAPS: 15.0000 mg | ORAL_CAPSULE | Freq: Every evening | ORAL | Status: DC | PRN

## 2012-07-26 NOTE — Progress Notes (Signed)
Subjective:    Patient ID: CLOIS COLA, female    DOB: [birth year], 56 y.o.   MRN: [id number]  HPI Sun is here for comprehensive eval.    Overall doing well.  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  I do not have Xfray imaging results to confirm  See today's U/A  Pt reports she has a history of PVC"S  Evaluated by Dr. Clarene Duke in the past  She reports shehad a nuclear stress test in the past  No SOB,  No chest pain, pressure or cardiac symptoms.  She has productive cough at times which she contributes to smoking.  I note that in 2003 she had hyperinflation on a CXR consistent with COPD.  She is not interested in smoking cessation now despite my advice  MNG:  See biopsy :  Negative for malignancy UTD with vaccines, mm and colonoscopy  No Known Allergies Past Medical History  Diagnosis Date  . Anxiety   . Osteopenia   . Thyroid disease   . Goiter   . Tobacco use disorder   . Acne    Past Surgical History  Procedure Date  . Cesarean section 1990, 1993  . Bunionectomy 1978  . Tubal ligation 1993  . Hemorrhoid surgery 1994   History   Social History  . Marital Status: Married    Spouse Name: N/A    Number of Children: N/A  . Years of Education: N/A   Occupational History  . Not on file.   Social History Main Topics  . Smoking status: Current Every Day Smoker -- 1.0 packs/day for 40 years    Types: Cigarettes  . Smokeless tobacco: Never Used  . Alcohol Use: No  . Drug Use: No  . Sexually Active: Yes   Other Topics Concern  . Not on file   Social History Narrative  . No narrative on file   Family History  Problem Relation Age of Onset  . Hypertension Mother   . Heart disease Father     Afib, pacemaker  . Hypertension Father   . Diabetes Brother   . Hypertension Brother   .  Diabetes Brother   . Hypertension Brother    Patient Active Problem List  Diagnosis  . Anxiety  . Osteopenia  . Thyroid disease  . Goiter  . Tobacco use disorder  . Acne  . Depression  . ALLERGIC REACTION   Current Outpatient Prescriptions on File Prior to Visit  Medication Sig Dispense Refill  . temazepam (RESTORIL) 15 MG capsule TAKE 1 TO 2 CAPSULES AT BEDTIME IF NEEDED FOR SLEEP  60 capsule  0      Review of Systems  Respiratory: Positive for cough. Negative for shortness of breath and wheezing.   Cardiovascular: Negative for chest pain and palpitations.  All other systems reviewed and are negative.       Objective:   Physical Exam Physical Exam  Vital signs and nursing note reviewed  Constitutional: She is oriented to person, place, and time. She appears well-developed and well-nourished. She is cooperative.  HENT:  Head: Normocephalic and atraumatic.  Right Ear: Tympanic membrane normal.  Left Ear: Tympanic membrane normal.  Nose: Nose normal.  Mouth/Throat: Oropharynx is clear and moist and mucous membranes are normal. No oropharyngeal exudate or  posterior oropharyngeal erythema.  Eyes: Conjunctivae and EOM are normal. Pupils are equal, round, and reactive to light.  Neck: Neck supple. No JVD present. Carotid bruit is not present. No mass and no thyromegaly present.  Cardiovascular: Regular rhythm, normal heart sounds, intact distal pulses and normal pulses.  Exam reveals no gallop and no friction rub.   No murmur heard. Pulses:      Dorsalis pedis pulses are 2+ on the right side, and 2+ on the left side.  Pulmonary/Chest: Breath sounds normal. She has no wheezes. She has no rhonchi. She has no rales. Right breast exhibits no mass, no nipple discharge and no skin change. Left breast exhibits no mass, no nipple discharge and no skin change.  Abdominal: Soft. Bowel sounds are normal. She exhibits no distension and no mass. There is no hepatosplenomegaly. There is  no tenderness. There is no CVA tenderness.  Genitourinary: Rectum normal, vagina normal and uterus normal. Rectal exam shows no mass. Guaiac negative stool. No labial fusion. There is no lesion on the right labia. There is no lesion on the left labia. Cervix exhibits no motion tenderness. Right adnexum displays no mass, no tenderness and no fullness. Left adnexum displays no mass, no tenderness and no fullness. No erythema around the vagina.  Musculoskeletal:       No active synovitis to any joint.    Lymphadenopathy:       Right cervical: No superficial cervical adenopathy present.      Left cervical: No superficial cervical adenopathy present.       Right axillary: No pectoral and no lateral adenopathy present.       Left axillary: No pectoral and no lateral adenopathy present.      Right: No inguinal adenopathy present.       Left: No inguinal adenopathy present.  Neurological: She is alert and oriented to person, place, and time. She has normal strength and normal reflexes. No cranial nerve deficit or sensory deficit. She displays a negative Romberg sign. Coordination and gait normal.  Skin: Skin is warm and dry. No abrasion, no bruising, no ecchymosis and no rash noted. No cyanosis. Nails show no clubbing.  Psychiatric: She has a normal mood and affect. Her speech is normal and behavior is normal.          Assessment & Plan:   Health Maintenance:  See scanned HM sheet.  [place]  Cannot recall name of GI md  MNG  Will schedule thyroid ultrasound  COugh,  COPD by xray long term smoker  Will schedule chest CT  History of PVC"S  Asymptomatic  Prior work up Dr. Clarene Duke EKG shows PVcs today  Hematuria:  Pt gives history of renal stone.  Counseled to come back in 4-5 weeks for repeat u/A  She voices understanding  Anxiety/depression  Off Zoloft now  Tobacco use:  Counseled about cessation.  Pt not willing now  History of renal calculi  Per pt report         Assessment & Plan:

## 2012-07-26 NOTE — Patient Instructions (Addendum)
Make follow up appt in 4-6 weeks for follow up of blood in urine  To have CT scan today  Stop by Xray to schedule thyroid ultrasound  Labs on My chart

## 2012-07-28 ENCOUNTER — Telehealth: Payer: Self-pay | Admitting: Internal Medicine

## 2012-07-28 ENCOUNTER — Other Ambulatory Visit: Payer: Self-pay | Admitting: Internal Medicine

## 2012-07-28 NOTE — Telephone Encounter (Signed)
Spoke with pt and informed of thyroid and ct imaging results  Again advised to return to recheck U/A for blood she voices understanding  She has appt 12/16

## 2012-07-30 LAB — URINE CULTURE: Colony Count: NO GROWTH

## 2012-08-02 ENCOUNTER — Encounter: Payer: Self-pay | Admitting: *Deleted

## 2012-08-02 ENCOUNTER — Telehealth: Payer: Self-pay | Admitting: *Deleted

## 2012-08-02 NOTE — Telephone Encounter (Signed)
Labs mailed to pt home address.

## 2012-08-22 ENCOUNTER — Ambulatory Visit (INDEPENDENT_AMBULATORY_CARE_PROVIDER_SITE_OTHER): Payer: 59 | Admitting: Internal Medicine

## 2012-08-22 ENCOUNTER — Encounter: Payer: Self-pay | Admitting: Internal Medicine

## 2012-08-22 ENCOUNTER — Ambulatory Visit (HOSPITAL_BASED_OUTPATIENT_CLINIC_OR_DEPARTMENT_OTHER)
Admission: RE | Admit: 2012-08-22 | Discharge: 2012-08-22 | Disposition: A | Discharge status: Home / Self Care | Payer: 59 | Source: Ambulatory Visit | Attending: Internal Medicine | Admitting: Internal Medicine

## 2012-08-22 VITALS — BP 100/65 | HR 81 | Temp 97.3°F | Resp 18 | Wt 121.0 lb

## 2012-08-22 DIAGNOSIS — J449 Chronic obstructive pulmonary disease, unspecified: Secondary | ICD-10-CM

## 2012-08-22 DIAGNOSIS — R319 Hematuria, unspecified: Secondary | ICD-10-CM

## 2012-08-22 DIAGNOSIS — J4489 Other specified chronic obstructive pulmonary disease: Secondary | ICD-10-CM

## 2012-08-22 DIAGNOSIS — N200 Calculus of kidney: Secondary | ICD-10-CM | POA: Insufficient documentation

## 2012-08-22 LAB — POCT URINALYSIS DIPSTICK
Protein, UA: NEGATIVE
Spec Grav, UA: 1.01
Urobilinogen, UA: NEGATIVE
pH, UA: 6.5

## 2012-08-22 MED ORDER — CLINDAMYCIN PHOSPHATE 1 % EX SOLN: 1.0000 "application " | Freq: Two times a day (BID) | CUTANEOUS | Status: DC

## 2012-08-22 MED ORDER — TEMAZEPAM 15 MG PO CAPS: 15.0000 mg | ORAL_CAPSULE | Freq: Every evening | ORAL | Status: DC | PRN

## 2012-08-22 NOTE — Progress Notes (Signed)
  Subjective:    Patient ID: Taylor Atkins, female    DOB: Sep 14, 1955, 56 y.o.   MRN: [id number]  HPI  Taylor Atkins is here for follow up of several issues.  She has hematuria and believes she may have a kidney stone in th past 3 years ago when she was in [place].   She has no CVA pain  See Chest CT  Moderate pulmonary emphysema   She denies SOB or wheezing  Thyroid Ultrasound stable nodules.    No Known Allergies Past Medical History  Diagnosis Date  . Anxiety   . Osteopenia   . Thyroid disease   . Goiter   . Tobacco use disorder   . Acne    Past Surgical History  Procedure Date  . Cesarean section 1990, 1993  . Bunionectomy 1978  . Tubal ligation 1993  . Hemorrhoid surgery 1994   History   Social History  . Marital Status: Married    Spouse Name: N/A    Number of Children: N/A  . Years of Education: N/A   Occupational History  . Not on file.   Social History Main Topics  . Smoking status: Current Every Day Smoker -- 1.0 packs/day for 40 years    Types: Cigarettes  . Smokeless tobacco: Never Used  . Alcohol Use: No  . Drug Use: No  . Sexually Active: Yes   Other Topics Concern  . Not on file   Social History Narrative  . No narrative on file   Family History  Problem Relation Age of Onset  . Hypertension Mother   . Heart disease Father     Afib, pacemaker  . Hypertension Father   . Diabetes Brother   . Hypertension Brother   . Diabetes Brother   . Hypertension Brother    Patient Active Problem List  Diagnosis  . Anxiety  . Osteopenia  . Thyroid disease  . Goiter  . Tobacco use disorder  . Acne  . Depression  . ALLERGIC REACTION  . Hematuria  . History of renal stone  . Hyperinflation of lungs  . Asymptomatic PVCs   Current Outpatient Prescriptions on File Prior to Visit  Medication Sig Dispense Refill  . temazepam (RESTORIL) 15 MG capsule Take 1 capsule (15 mg total) by mouth at bedtime as needed for sleep.  60 capsule  0       Review of Systems See HPI    Objective:   Physical Exam Physical Exam  Nursing note and vitals reviewed.  Constitutional: She is oriented to person, place, and time. She appears well-developed and well-nourished.  HENT:  Head: Normocephalic and atraumatic.  Cardiovascular: Normal rate and regular rhythm. Exam reveals no gallop and no friction rub.  No murmur heard.  Pulmonary/Chest: Breath sounds normal. She has no wheezes. She has no rales.  Neurological: She is alert and oriented to person, place, and time.  Skin: Skin is warm and dry.  Psychiatric: She has a normal mood and affect. Her behavior is normal.              Assessment & Plan:   Hematuria  With history of renal stone  willl get CT abd/pelvis without to day  She is in no pain  COPD  I gave pt copy of CT.  She has no SOB or wheezing  MNG  I gave pt copy of thyroid ultrasound  Will repeat in one year

## 2012-08-22 NOTE — Patient Instructions (Addendum)
To have CT today  Call office in am

## 2012-08-23 ENCOUNTER — Telehealth: Payer: Self-pay | Admitting: *Deleted

## 2012-08-23 DIAGNOSIS — N200 Calculus of kidney: Secondary | ICD-10-CM | POA: Insufficient documentation

## 2012-08-23 NOTE — Telephone Encounter (Signed)
Spoke with pt and informed of CT results.    Will refer to Urology

## 2012-08-23 NOTE — Telephone Encounter (Signed)
Called to inform pt of appt on 09/14/12 at 0800

## 2012-08-23 NOTE — Telephone Encounter (Signed)
Pt returned Dr Lonell Face call at 1232 08/23/12, Regarding lab results

## 2012-09-21 ENCOUNTER — Other Ambulatory Visit: Payer: Self-pay | Admitting: Internal Medicine

## 2012-09-21 ENCOUNTER — Telehealth: Payer: Self-pay | Admitting: Internal Medicine

## 2012-09-21 DIAGNOSIS — N390 Urinary tract infection, site not specified: Secondary | ICD-10-CM

## 2012-09-21 MED ORDER — CIPROFLOXACIN HCL 500 MG PO TABS: 500.0000 mg | ORAL_TABLET | Freq: Two times a day (BID) | ORAL | Status: DC

## 2012-09-21 NOTE — Telephone Encounter (Signed)
Pt called and reports she had recent Cystoscopy procedure and now having UTI symptoms.  Will send 7 day course of Cipro to pharmacy

## 2012-09-22 ENCOUNTER — Telehealth: Payer: Self-pay | Admitting: *Deleted

## 2012-09-22 LAB — URINALYSIS W MICROSCOPIC + REFLEX CULTURE
Bilirubin Urine: NEGATIVE
Casts: NONE SEEN
Glucose, UA: NEGATIVE mg/dL
Hgb urine dipstick: NEGATIVE
Protein, ur: NEGATIVE mg/dL

## 2012-09-22 NOTE — Telephone Encounter (Signed)
Left message for pt to return call.

## 2012-09-22 NOTE — Telephone Encounter (Signed)
Message copied by Mathews Robinsons on Thu Sep 22, 2012  1:42 PM ------      Message from: Raechel Chute D      Created: Thu Sep 22, 2012  8:05 AM       Karen Kitchens              Call pt and let her know that her urine shows red cells but minimal white blood cells.  Have her see me in 2-3 weeks for follow up.  Culture will take 2-3 days

## 2012-11-11 ENCOUNTER — Other Ambulatory Visit: Payer: Self-pay | Admitting: Internal Medicine

## 2012-11-14 NOTE — Telephone Encounter (Signed)
Refill request

## 2012-11-14 NOTE — Telephone Encounter (Signed)
Called to verify pharmacy and restoril called in to [facility] pharmacy

## 2013-02-23 ENCOUNTER — Other Ambulatory Visit: Payer: Self-pay | Admitting: *Deleted

## 2013-02-23 MED ORDER — TEMAZEPAM 15 MG PO CAPS: ORAL_CAPSULE | ORAL | Status: DC

## 2013-02-23 NOTE — Telephone Encounter (Signed)
called in temazepam to [facility]

## 2013-04-10 ENCOUNTER — Other Ambulatory Visit: Payer: Self-pay | Admitting: Internal Medicine

## 2013-04-10 DIAGNOSIS — Z760 Encounter for issue of repeat prescription: Secondary | ICD-10-CM

## 2013-04-12 ENCOUNTER — Telehealth: Payer: Self-pay | Admitting: *Deleted

## 2013-04-12 NOTE — Telephone Encounter (Signed)
Rcvd refill request from pharm for Restoril - called pharm to refill but pt was not due for refill just yet since #60 was sent in less than 2 months ago - I called pt to see how many pills she had left and she states she has 5 pills let and been taking 2 pills at night to sleep instead of one as directed - I spoke to Dr Lynnae Prude about this and she adv to call in script for #30 with one refill but to adv pt to only take 1 pill at night and not 2 pills as she has been - I called pharm and spoke to Bailey to adv the change in the script - Called pt back to adv script has been sent to pharm but she needs to be sure to only take 1 pill at night as needed for sleep - pt understood and had no other questions - she is to go to pharm to pick up script

## 2013-04-12 NOTE — Telephone Encounter (Signed)
I received a refill request from [facility] for the Restoril. I have refilled the script in the system but will call pharm to ensure they have rcvd this.

## 2013-07-13 ENCOUNTER — Other Ambulatory Visit: Payer: Self-pay

## 2013-07-20 ENCOUNTER — Other Ambulatory Visit: Payer: Self-pay | Admitting: *Deleted

## 2013-07-20 DIAGNOSIS — Z760 Encounter for issue of repeat prescription: Secondary | ICD-10-CM

## 2013-07-20 MED ORDER — TEMAZEPAM 15 MG PO CAPS: ORAL_CAPSULE | ORAL | Status: DC

## 2013-07-20 NOTE — Telephone Encounter (Signed)
Refill request

## 2013-07-21 NOTE — Telephone Encounter (Signed)
Called in [place] to [facility].

## 2013-09-20 ENCOUNTER — Encounter: Payer: Self-pay | Admitting: Internal Medicine

## 2013-09-20 ENCOUNTER — Ambulatory Visit (INDEPENDENT_AMBULATORY_CARE_PROVIDER_SITE_OTHER): Payer: 59 | Admitting: Internal Medicine

## 2013-09-20 VITALS — BP 114/68 | HR 74 | Temp 98.0°F | Resp 18 | Wt 121.0 lb

## 2013-09-20 DIAGNOSIS — R42 Dizziness and giddiness: Secondary | ICD-10-CM

## 2013-09-20 MED ORDER — MECLIZINE HCL 25 MG PO TABS: ORAL_TABLET | ORAL | Status: DC

## 2013-09-20 NOTE — Patient Instructions (Signed)
Will set up with vestibular rehab  Take antivert as needed  See me in 7-10 days

## 2013-09-20 NOTE — Progress Notes (Signed)
Subjective:    Patient ID: Taylor Atkins, female    DOB: May 19, 1956, 58 y.o.   MRN: [id number]  HPI Taylor Atkins is here for acute visit.   Began with sudden onset of vertigo this am when getting up.  symtoms recur when she bends her head back  NO Nausea or decreased hearing.  No visual or speech change,   No numbness or muscle weakness.    Denies pre-syncope  Feels  Like  "room is spinning"    SHe has history of PVC"S but she did not feel any irregular or rapid heartbeat.    No Known Allergies Past Medical History  Diagnosis Date  . Anxiety   . Osteopenia   . Thyroid disease   . Goiter   . Tobacco use disorder   . Acne    Past Surgical History  Procedure Laterality Date  . Cesarean section  1990, 1993  . Bunionectomy  1978  . Tubal ligation  1993  . Hemorrhoid surgery  1994   History   Social History  . Marital Status: Married    Spouse Name: N/A    Number of Children: N/A  . Years of Education: N/A   Occupational History  . Not on file.   Social History Main Topics  . Smoking status: Current Every Day Smoker -- 1.00 packs/day for 40 years    Types: Cigarettes  . Smokeless tobacco: Never Used  . Alcohol Use: No  . Drug Use: No  . Sexual Activity: Yes   Other Topics Concern  . Not on file   Social History Narrative  . No narrative on file   Family History  Problem Relation Age of Onset  . Hypertension Mother   . Heart disease Father     Afib, pacemaker  . Hypertension Father   . Diabetes Brother   . Hypertension Brother   . Diabetes Brother   . Hypertension Brother    Patient Active Problem List   Diagnosis Date Noted  . Renal calculus, left 08/23/2012  . COPD (chronic obstructive pulmonary disease) 08/22/2012  . Hematuria 07/26/2012  . History of renal stone 07/26/2012  . Hyperinflation of lungs 07/26/2012  . Asymptomatic PVCs 07/26/2012  . Depression 07/13/2011  . Anxiety   . Osteopenia   . Thyroid disease   . Goiter   . Tobacco use  disorder   . Acne   . ALLERGIC REACTION 02/07/2011   Current Outpatient Prescriptions on File Prior to Visit  Medication Sig Dispense Refill  . ciprofloxacin (CIPRO) 500 MG tablet Take 1 tablet (500 mg total) by mouth 2 (two) times daily.  14 tablet  0  . clindamycin (CLEOCIN T) 1 % external solution Apply 1 application topically 2 (two) times daily.  30 mL  5  . temazepam (RESTORIL) 15 MG capsule TAKE 1 CAPSULE BY MOUTH AT BEDTIME AS NEEDED FOR SLEEP  30 capsule  2   No current facility-administered medications on file prior to visit.       Review of Systems See HPI    Objective:   Physical Exam  Physical Exam  Nursing note and vitals reviewed.  Constitutional: She is oriented to person, place, and time. She appears well-developed and well-nourished.  HENT:  Head: Normocephalic and atraumatic.  Cardiovascular: Normal rate and regular rhythm. Exam reveals no gallop and no friction rub.  No murmur heard.  Pulmonary/Chest: Breath sounds normal. She has no wheezes. She has no rales.  Neurological: She is alert and oriented  to person, place, and time. CN II-Xii intact  Cerebellar  Intact FTN Motor 5/5 all extremities Reflexes 2+ symmetric Sensory intact to microfilament  Skin: Skin is warm and dry.  Psychiatric: She has a normal mood and affect. Her behavior is normal.              Assessment & Plan:  BPV  Will give RX antivert and refer to vestibular rehab.  ADvised if any vision, speech change,  Numbness, tingling or muscle weakness to go to ER  Dizziness  See above

## 2013-09-28 ENCOUNTER — Ambulatory Visit (INDEPENDENT_AMBULATORY_CARE_PROVIDER_SITE_OTHER): Payer: 59 | Admitting: Internal Medicine

## 2013-09-28 ENCOUNTER — Encounter: Payer: Self-pay | Admitting: Internal Medicine

## 2013-09-28 VITALS — BP 105/56 | HR 68 | Resp 14 | Ht 63.0 in | Wt 121.0 lb

## 2013-09-28 DIAGNOSIS — R42 Dizziness and giddiness: Secondary | ICD-10-CM

## 2013-09-28 NOTE — Progress Notes (Signed)
   Subjective:    Patient ID: Taylor Atkins, female    DOB: [birth year], 58 y.o.   MRN: [id number]  HPI Taylor Atkins is here for follow up.  Dizziness is greatly improved    No cough no URi symptoms  No Known Allergies Past Medical History  Diagnosis Date  . Anxiety   . Osteopenia   . Thyroid disease   . Goiter   . Tobacco use disorder   . Acne    Past Surgical History  Procedure Laterality Date  . Cesarean section  1990, 1993  . Bunionectomy  1978  . Tubal ligation  1993  . Hemorrhoid surgery  1994   History   Social History  . Marital Status: Married    Spouse Name: N/A    Number of Children: N/A  . Years of Education: N/A   Occupational History  . Not on file.   Social History Main Topics  . Smoking status: Current Every Day Smoker -- 1.00 packs/day for 40 years    Types: Cigarettes  . Smokeless tobacco: Never Used  . Alcohol Use: No  . Drug Use: No  . Sexual Activity: Yes   Other Topics Concern  . Not on file   Social History Narrative  . No narrative on file   Family History  Problem Relation Age of Onset  . Hypertension Mother   . Heart disease Father     Afib, pacemaker  . Hypertension Father   . Diabetes Brother   . Hypertension Brother   . Diabetes Brother   . Hypertension Brother    Patient Active Problem List   Diagnosis Date Noted  . Vertigo 09/20/2013  . Renal calculus, left 08/23/2012  . COPD (chronic obstructive pulmonary disease) 08/22/2012  . Hematuria 07/26/2012  . History of renal stone 07/26/2012  . Hyperinflation of lungs 07/26/2012  . Asymptomatic PVCs 07/26/2012  . Depression 07/13/2011  . Anxiety   . Osteopenia   . Thyroid disease   . Goiter   . Tobacco use disorder   . Acne   . ALLERGIC REACTION 02/07/2011   Current Outpatient Prescriptions on File Prior to Visit  Medication Sig Dispense Refill  . clindamycin (CLEOCIN T) 1 % external solution Apply 1 application topically 2 (two) times daily.  30 mL  5  .  meclizine (ANTIVERT) 25 MG tablet Take one tablet every 8 hours as needed for dizziness  30 tablet  0  . temazepam (RESTORIL) 15 MG capsule TAKE 1 CAPSULE BY MOUTH AT BEDTIME AS NEEDED FOR SLEEP  30 capsule  2   No current facility-administered medications on file prior to visit.      Review of Systems See HPI    Objective:   Physical Exam  Physical Exam  Nursing note and vitals reviewed.  Constitutional: She is oriented to person, place, and time. She appears well-developed and well-nourished.  HENT:  Head: Normocephalic and atraumatic.  Cardiovascular: Normal rate and regular rhythm. Exam reveals no gallop and no friction rub.  No murmur heard.  Pulmonary/Chest: Breath sounds normal. She has no wheezes. She has no rales.  Neurological: She is alert and oriented to person, place, and time.  Skin: Skin is warm and dry.  Psychiatric: She has a normal mood and affect. Her behavior is normal.        Assessment & Plan:  Vertigo :  improving

## 2013-10-25 ENCOUNTER — Other Ambulatory Visit: Payer: Self-pay | Admitting: *Deleted

## 2013-10-25 DIAGNOSIS — Z760 Encounter for issue of repeat prescription: Secondary | ICD-10-CM

## 2013-10-25 NOTE — Telephone Encounter (Signed)
Refill request will call in pending approval 

## 2013-10-26 MED ORDER — TEMAZEPAM 15 MG PO CAPS: ORAL_CAPSULE | ORAL | Status: DC

## 2013-10-26 NOTE — Telephone Encounter (Signed)
Pt picked up this RX  On 10/23/13 for 30 tabs no refills. States that she will have her next RX called in to [facility]. The RX requested on 10/26/13 was not called in. Pt will call when refill is needed

## 2013-10-26 NOTE — Telephone Encounter (Signed)
Ok to call in

## 2013-11-15 ENCOUNTER — Ambulatory Visit: Payer: 59 | Attending: Internal Medicine | Admitting: Physical Therapy

## 2013-11-15 DIAGNOSIS — H8110 Benign paroxysmal vertigo, unspecified ear: Secondary | ICD-10-CM | POA: Insufficient documentation

## 2013-11-15 DIAGNOSIS — IMO0001 Reserved for inherently not codable concepts without codable children: Secondary | ICD-10-CM | POA: Insufficient documentation

## 2013-11-20 ENCOUNTER — Other Ambulatory Visit: Payer: Self-pay | Admitting: Internal Medicine

## 2013-11-20 NOTE — Telephone Encounter (Signed)
Refill request

## 2013-11-22 ENCOUNTER — Encounter: Payer: 59 | Admitting: Physical Therapy

## 2013-11-22 ENCOUNTER — Other Ambulatory Visit: Payer: Self-pay | Admitting: *Deleted

## 2013-11-29 ENCOUNTER — Ambulatory Visit: Payer: 59 | Admitting: Physical Therapy

## 2013-12-06 ENCOUNTER — Ambulatory Visit: Payer: 59 | Admitting: Physical Therapy

## 2013-12-28 ENCOUNTER — Ambulatory Visit (HOSPITAL_BASED_OUTPATIENT_CLINIC_OR_DEPARTMENT_OTHER)
Admission: RE | Admit: 2013-12-28 | Discharge: 2013-12-28 | Disposition: A | Discharge status: Home / Self Care | Payer: 59 | Source: Ambulatory Visit | Attending: Internal Medicine | Admitting: Internal Medicine

## 2013-12-28 ENCOUNTER — Encounter: Payer: Self-pay | Admitting: Internal Medicine

## 2013-12-28 ENCOUNTER — Ambulatory Visit (INDEPENDENT_AMBULATORY_CARE_PROVIDER_SITE_OTHER): Payer: 59 | Admitting: Internal Medicine

## 2013-12-28 VITALS — BP 112/63 | HR 81 | Temp 98.2°F | Resp 16 | Wt 122.0 lb

## 2013-12-28 DIAGNOSIS — H6120 Impacted cerumen, unspecified ear: Secondary | ICD-10-CM

## 2013-12-28 DIAGNOSIS — L989 Disorder of the skin and subcutaneous tissue, unspecified: Secondary | ICD-10-CM

## 2013-12-28 DIAGNOSIS — J439 Emphysema, unspecified: Secondary | ICD-10-CM

## 2013-12-28 DIAGNOSIS — E042 Nontoxic multinodular goiter: Secondary | ICD-10-CM | POA: Insufficient documentation

## 2013-12-28 DIAGNOSIS — J449 Chronic obstructive pulmonary disease, unspecified: Secondary | ICD-10-CM

## 2013-12-28 DIAGNOSIS — E079 Disorder of thyroid, unspecified: Secondary | ICD-10-CM

## 2013-12-28 DIAGNOSIS — H9319 Tinnitus, unspecified ear: Secondary | ICD-10-CM

## 2013-12-28 DIAGNOSIS — Z760 Encounter for issue of repeat prescription: Secondary | ICD-10-CM

## 2013-12-28 DIAGNOSIS — J438 Other emphysema: Secondary | ICD-10-CM

## 2013-12-28 MED ORDER — TEMAZEPAM 15 MG PO CAPS: ORAL_CAPSULE | ORAL | Status: DC

## 2013-12-28 NOTE — Progress Notes (Signed)
Subjective:    Patient ID: Taylor Atkins, female    DOB: [birth year], 58 y.o.   MRN: [id number]  HPI  Acsa is here for follow up.  She states vertigo is gone but she has tinnitus esp in Right ear.    She also has non healing skin lesion left shoulder  .  She does not wish to see Dr. Jettie Pagan  She has MNG with neg biopsy  She is due for repeat U/S    Temazepam helping insomnia   No Known Allergies Past Medical History  Diagnosis Date  . Anxiety   . Osteopenia   . Thyroid disease   . Goiter   . Tobacco use disorder   . Acne    Past Surgical History  Procedure Laterality Date  . Cesarean section  1990, 1993  . Bunionectomy  1978  . Tubal ligation  1993  . Hemorrhoid surgery  1994   History   Social History  . Marital Status: Married    Spouse Name: N/A    Number of Children: N/A  . Years of Education: N/A   Occupational History  . Not on file.   Social History Main Topics  . Smoking status: Current Every Day Smoker -- 1.00 packs/day for 40 years    Types: Cigarettes  . Smokeless tobacco: Never Used  . Alcohol Use: No  . Drug Use: No  . Sexual Activity: Yes   Other Topics Concern  . Not on file   Social History Narrative  . No narrative on file   Family History  Problem Relation Age of Onset  . Hypertension Mother   . Heart disease Father     Afib, pacemaker  . Hypertension Father   . Diabetes Brother   . Hypertension Brother   . Diabetes Brother   . Hypertension Brother    Patient Active Problem List   Diagnosis Date Noted  . Multinodular goiter  see ultrasound  2014 12/28/2013  . Emphysema lung  CT finding 12/28/2013  . Vertigo 09/20/2013  . Renal calculus, left 08/23/2012  . COPD (chronic obstructive pulmonary disease) 08/22/2012  . Hematuria 07/26/2012  . History of renal stone 07/26/2012  . Hyperinflation of lungs 07/26/2012  . Asymptomatic PVCs 07/26/2012  . Depression 07/13/2011  . Anxiety   . Osteopenia   . Thyroid disease    . Goiter   . Tobacco use disorder   . Acne   . ALLERGIC REACTION 02/07/2011   Current Outpatient Prescriptions on File Prior to Visit  Medication Sig Dispense Refill  . meclizine (ANTIVERT) 25 MG tablet Take one tablet every 8 hours as needed for dizziness  30 tablet  0  . temazepam (RESTORIL) 15 MG capsule TAKE 1 CAPSULE BY MOUTH AT BEDTIME AS NEEDED FOR SLEEP  30 capsule  1   No current facility-administered medications on file prior to visit.      Review of Systems See HPI    Objective:   Physical Exam Physical Exam  Nursing note and vitals reviewed.  Constitutional: She is oriented to person, place, and time. She appears well-developed and well-nourished.  HENT:  Head: Normocephalic and atraumatic.  Tm"s  Righ cerumen plug  Left serous effusion Cardiovascular: Normal rate and regular rhythm. Exam reveals no gallop and no friction rub.  No murmur heard.  Pulmonary/Chest: Breath sounds normal. She has no wheezes. She has no rales.  Neurological: She is alert and oriented to person, place, and time.  Skin: Skin is warm  and dry. She has non healing lesion  Left shoulder Psychiatric: She has a normal mood and affect. Her behavior is normal.             Assessment & Plan:  Tinnitus  / cerumen plug  Will do water irrigation today  Refer ENT tinnitus  Skin lesion  Will refer to derm  MNG  Will scheudle  Thyroid u/s  Insomnia  Temazepam OK    Shedule CPE get labs today  Pt voices understanding of all above

## 2013-12-28 NOTE — Patient Instructions (Addendum)
Will refer to Dr. Constance Holster ENT  Tinnitus  Will refer to Dermatolgoy    To labs today    Stop by xray today

## 2013-12-29 LAB — COMPREHENSIVE METABOLIC PANEL
ALK PHOS: 65 U/L (ref 39–117)
ALT: 9 U/L (ref 0–35)
AST: 16 U/L (ref 0–37)
Albumin: 4.4 g/dL (ref 3.5–5.2)
BUN: 11 mg/dL (ref 6–23)
CO2: 28 mEq/L (ref 19–32)
CREATININE: 0.83 mg/dL (ref 0.50–1.10)
Calcium: 9.3 mg/dL (ref 8.4–10.5)
Chloride: 105 mEq/L (ref 96–112)
Glucose, Bld: 93 mg/dL (ref 70–99)
Potassium: 4.2 mEq/L (ref 3.5–5.3)
Sodium: 141 mEq/L (ref 135–145)
Total Bilirubin: 0.4 mg/dL (ref 0.2–1.2)
Total Protein: 6.2 g/dL (ref 6.0–8.3)

## 2013-12-29 LAB — CBC WITH DIFFERENTIAL/PLATELET
BASOS ABS: 0 10*3/uL (ref 0.0–0.1)
BASOS PCT: 0 % (ref 0–1)
EOS PCT: 2 % (ref 0–5)
Eosinophils Absolute: 0.1 10*3/uL (ref 0.0–0.7)
HEMATOCRIT: 38.3 % (ref 36.0–46.0)
Hemoglobin: 13.8 g/dL (ref 12.0–15.0)
Lymphocytes Relative: 34 % (ref 12–46)
Lymphs Abs: 2.3 10*3/uL (ref 0.7–4.0)
MCH: 30.3 pg (ref 26.0–34.0)
MCHC: 36 g/dL (ref 30.0–36.0)
MCV: 84 fL (ref 78.0–100.0)
MONO ABS: 0.5 10*3/uL (ref 0.1–1.0)
Monocytes Relative: 7 % (ref 3–12)
Neutro Abs: 3.9 10*3/uL (ref 1.7–7.7)
Neutrophils Relative %: 57 % (ref 43–77)
PLATELETS: 202 10*3/uL (ref 150–400)
RBC: 4.56 MIL/uL (ref 3.87–5.11)
RDW: 13.6 % (ref 11.5–15.5)
WBC: 6.9 10*3/uL (ref 4.0–10.5)

## 2013-12-29 LAB — LIPID PANEL
CHOL/HDL RATIO: 3.6 ratio
Cholesterol: 162 mg/dL (ref 0–200)
HDL: 45 mg/dL (ref >39)
LDL CALC: 104 mg/dL — AB (ref 0–99)
Triglycerides: 65 mg/dL (ref <150)
VLDL: 13 mg/dL (ref 0–40)

## 2013-12-29 LAB — TSH: TSH: 0.334 u[IU]/mL — AB (ref 0.350–4.500)

## 2013-12-30 LAB — VITAMIN D 25 HYDROXY (VIT D DEFICIENCY, FRACTURES): Vit D, 25-Hydroxy: 27 ng/mL — ABNORMAL LOW (ref 30–89)

## 2014-01-05 ENCOUNTER — Encounter: Payer: Self-pay | Admitting: Internal Medicine

## 2014-01-09 ENCOUNTER — Encounter: Payer: Self-pay | Admitting: Internal Medicine

## 2014-01-09 ENCOUNTER — Telehealth: Payer: Self-pay | Admitting: Internal Medicine

## 2014-01-09 DIAGNOSIS — R7989 Other specified abnormal findings of blood chemistry: Secondary | ICD-10-CM | POA: Insufficient documentation

## 2014-01-09 NOTE — Telephone Encounter (Signed)
Left message on pts cell phone to call office regarding thyroid blood test

## 2014-01-09 NOTE — Telephone Encounter (Signed)
Spoke with pt and informed of thyroid U/S and labs  See me in 3 months  If TSh further supressed will get nuclear imaging  She is to get free T3 an dfree T 4    Pt voices understanding

## 2014-01-11 ENCOUNTER — Encounter: Payer: Self-pay | Admitting: Internal Medicine

## 2014-01-11 LAB — T3, FREE: T3, Free: 2.8 pg/mL (ref 2.3–4.2)

## 2014-01-11 LAB — T4, FREE: Free T4: 1.14 ng/dL (ref 0.80–1.80)

## 2014-02-05 DIAGNOSIS — C4490 Unspecified malignant neoplasm of skin, unspecified: Secondary | ICD-10-CM

## 2014-02-05 HISTORY — DX: Unspecified malignant neoplasm of skin, unspecified: C44.90

## 2014-02-23 ENCOUNTER — Telehealth: Payer: Self-pay | Admitting: Internal Medicine

## 2014-02-23 DIAGNOSIS — Z760 Encounter for issue of repeat prescription: Secondary | ICD-10-CM

## 2014-02-23 MED ORDER — TEMAZEPAM 15 MG PO CAPS: ORAL_CAPSULE | ORAL | Status: DC

## 2014-02-23 NOTE — Telephone Encounter (Signed)
Per call with Dr. Coralyn Mark, refilled Restoril

## 2014-04-10 ENCOUNTER — Encounter: Payer: Self-pay | Admitting: Internal Medicine

## 2014-04-10 ENCOUNTER — Ambulatory Visit (INDEPENDENT_AMBULATORY_CARE_PROVIDER_SITE_OTHER): Payer: 59 | Admitting: Internal Medicine

## 2014-04-10 ENCOUNTER — Other Ambulatory Visit: Payer: Self-pay | Admitting: Internal Medicine

## 2014-04-10 VITALS — BP 115/65 | HR 84 | Temp 98.1°F | Resp 16 | Ht 63.0 in | Wt 125.0 lb

## 2014-04-10 DIAGNOSIS — I4949 Other premature depolarization: Secondary | ICD-10-CM

## 2014-04-10 DIAGNOSIS — E042 Nontoxic multinodular goiter: Secondary | ICD-10-CM

## 2014-04-10 DIAGNOSIS — E559 Vitamin D deficiency, unspecified: Secondary | ICD-10-CM

## 2014-04-10 DIAGNOSIS — R7989 Other specified abnormal findings of blood chemistry: Secondary | ICD-10-CM

## 2014-04-10 DIAGNOSIS — I493 Ventricular premature depolarization: Secondary | ICD-10-CM

## 2014-04-10 DIAGNOSIS — J439 Emphysema, unspecified: Secondary | ICD-10-CM

## 2014-04-10 DIAGNOSIS — R319 Hematuria, unspecified: Secondary | ICD-10-CM

## 2014-04-10 DIAGNOSIS — N200 Calculus of kidney: Secondary | ICD-10-CM

## 2014-04-10 DIAGNOSIS — F17200 Nicotine dependence, unspecified, uncomplicated: Secondary | ICD-10-CM

## 2014-04-10 DIAGNOSIS — J438 Other emphysema: Secondary | ICD-10-CM

## 2014-04-10 DIAGNOSIS — E049 Nontoxic goiter, unspecified: Secondary | ICD-10-CM

## 2014-04-10 DIAGNOSIS — Z0000 Encounter for general adult medical examination without abnormal findings: Secondary | ICD-10-CM

## 2014-04-10 LAB — POCT URINALYSIS DIPSTICK
BILIRUBIN UA: NEGATIVE
Glucose, UA: NEGATIVE
KETONES UA: NEGATIVE
Nitrite, UA: NEGATIVE
Protein, UA: NEGATIVE
SPEC GRAV UA: 1.01
Urobilinogen, UA: 0.2
pH, UA: 7.5

## 2014-04-10 NOTE — Patient Instructions (Addendum)
Give pt number to [facility] to schedule 3D mammogram   Will set up referral to [facility] for colonoscopy  Give pt number to Dr. Patrice Paradise opthalmology    Will order screening chest CT

## 2014-04-10 NOTE — Progress Notes (Signed)
Subjective:    Patient ID: Taylor Atkins, female    DOB: 1956-03-10, 58 y.o.   MRN: [id number]  HPI  Taylor Atkins is here for CPE  HM:  She is due for mm,  Last colonoscopy age 30 by GI MD in [place].  Pap neg with exception of  atrophic vaginitis  2013  Chronic hematuria  Extensive evaluation  Dr. McDiarmid    No cause found  MNG  See bx result of R sided nodule 2013  Non-neoplastic goiter.   She had repeat  U/S 12/2013 which showed no significant change in 1.3 cm nodule in R lobe when compared ot 2013.   Pt reports she was evaluated by an endocrinologist in [place] she reports she had a nuclear scan and at one point was on a low dose synthroid which she stopped herself    She was told at one point she had thyroiditis.     TSH low 12/2013  With normal free levels.   Pt denies palpitations, weight loss,    Tobacco use  Not interested in cessation.   She does have emphysematous changes on CT.  No SOB no cough  No Known Allergies Past Medical History  Diagnosis Date  . Anxiety   . Osteopenia   . Thyroid disease   . Goiter   . Tobacco use disorder   . Acne   . Skin cancer 6/15   Past Surgical History  Procedure Laterality Date  . Cesarean section  1990, 1993  . Bunionectomy  1978  . Tubal ligation  1993  . Hemorrhoid surgery  1994   History   Social History  . Marital Status: Married    Spouse Name: N/A    Number of Children: N/A  . Years of Education: N/A   Occupational History  . Not on file.   Social History Main Topics  . Smoking status: Current Every Day Smoker -- 1.00 packs/day for 40 years    Types: Cigarettes  . Smokeless tobacco: Never Used  . Alcohol Use: No  . Drug Use: No  . Sexual Activity: Yes   Other Topics Concern  . Not on file   Social History Narrative  . No narrative on file   Family History  Problem Relation Age of Onset  . Hypertension Mother   . Heart disease Father     Afib, pacemaker  . Hypertension Father   . Diabetes Brother     . Hypertension Brother   . Diabetes Brother   . Hypertension Brother    Patient Active Problem List   Diagnosis Date Noted  . Abnormal TSH 01/09/2014  . Multinodular goiter  see ultrasound  2014 12/28/2013  . Emphysema lung  CT finding 12/28/2013  . Vertigo 09/20/2013  . Renal calculus, left 08/23/2012  . COPD (chronic obstructive pulmonary disease) 08/22/2012  . Hematuria 07/26/2012  . History of renal stone 07/26/2012  . Hyperinflation of lungs 07/26/2012  . Asymptomatic PVCs 07/26/2012  . Depression 07/13/2011  . Anxiety   . Osteopenia   . Thyroid disease   . Goiter   . Tobacco use disorder   . Acne   . ALLERGIC REACTION 02/07/2011   Current Outpatient Prescriptions on File Prior to Visit  Medication Sig Dispense Refill  . meclizine (ANTIVERT) 25 MG tablet Take one tablet every 8 hours as needed for dizziness  30 tablet  0  . temazepam (RESTORIL) 15 MG capsule TAKE 1 CAPSULE BY MOUTH AT BEDTIME AS NEEDED FOR  SLEEP  30 capsule  1   No current facility-administered medications on file prior to visit.      Review of Systems  Respiratory: Negative for cough, chest tightness, shortness of breath, wheezing and stridor.   Cardiovascular: Negative for chest pain, palpitations and leg swelling.  Gastrointestinal: Negative for abdominal pain.       Objective:   Physical Exam Physical Exam  Nursing note and vitals reviewed.  Constitutional: She is oriented to person, place, and time. She appears well-developed and well-nourished.  HENT:  Head: Normocephalic and atraumatic.  Right Ear: Tympanic membrane and ear canal normal. No drainage. Tympanic membrane is not injected and not erythematous.  Left Ear: Tympanic membrane and ear canal normal. No drainage. Tympanic membrane is not injected and not erythematous.  Nose: Nose normal. Right sinus exhibits no maxillary sinus tenderness and no frontal sinus tenderness. Left sinus exhibits no maxillary sinus tenderness and no  frontal sinus tenderness.  Mouth/Throat: Oropharynx is clear and moist. No oral lesions. No oropharyngeal exudate.  Eyes: Conjunctivae and EOM are normal. Pupils are equal, round, and reactive to light.  Neck: Normal range of motion. Neck supple. No JVD present. Carotid bruit is not present. No mass and no thyromegaly present.  Cardiovascular: Normal rate, regular rhythm, S1 normal, S2 normal and intact distal pulses. Exam reveals no gallop and no friction rub.  No murmur heard.  Pulses:  Carotid pulses are 2+ on the right side, and 2+ on the left side.  Dorsalis pedis pulses are 2+ on the right side, and 2+ on the left side.  No carotid bruit. No LE edema  Pulmonary/Chest: Breath sounds normal. She has no wheezes. She has no rales. She exhibits no tenderness.   Breast  No discrete mass no nipple discharge no axillary adenopathy bilaterally Abdominal: Soft. Bowel sounds are normal. She exhibits no distension and no mass. There is no hepatosplenomegaly. There is no tenderness. There is no CVA tenderness.   Rectal no mass guaiac neg Musculoskeletal: Normal range of motion.  No active synovitis to joints.  Lymphadenopathy:  She has no cervical adenopathy.  She has no axillary adenopathy.  Right: No inguinal and no supraclavicular adenopathy present.  Left: No inguinal and no supraclavicular adenopathy present.  Neurological: She is alert and oriented to person, place, and time. She has normal strength and normal reflexes. She displays no tremor. No cranial nerve deficit or sensory deficit. Coordination and gait normal.  Skin: Skin is warm and dry. No rash noted. No cyanosis. Nails show no clubbing.  Psychiatric: She has a normal mood and affect. Her speech is normal and behavior is normal. Cognition and memory are normal.          Assessment & Plan:  HM:    RX given for Prevnar 13 and order given.    Counseled new lung cancer guidelines and will order  Screening Chest CT..  Pap next year   Advised to get 3D mm  Will refer to [facility] for screening colonoscopy.  MNG/ supressed TSH:   Will check  TSH and free levels today.    Pt asymptomatic  Chronic hematuria:    Extensive work-up with urology  Continue with urology  Tobacco use  Advised cessation  Screening Chest CT ordered  Asymptomatic PVC"s    MNG  Hematuria  Extensive work up Dr. McDiarmid with Contrasted CT and cystoscopy   I spent 45 mins with this pt

## 2014-04-11 ENCOUNTER — Other Ambulatory Visit: Payer: Self-pay | Admitting: *Deleted

## 2014-04-11 DIAGNOSIS — Z760 Encounter for issue of repeat prescription: Secondary | ICD-10-CM

## 2014-04-11 NOTE — Addendum Note (Signed)
Addended by: Sherrye Payor on: 04/11/2014 09:49 AM   Modules accepted: Orders

## 2014-04-11 NOTE — Telephone Encounter (Signed)
Sorry if this is a duplicate

## 2014-04-11 NOTE — Telephone Encounter (Signed)
  ','<  More Detail >>          Medication Renewal Request    Taylor Atkins     Sent: Tue April 10, 2014 6:11 PM    To: [facility] Clinical Pool    Phone [phone]        Taylor Atkins    MRN: [id number] DOB: November 25, 1955     Pt Work: [phone] Pt Home: [phone]    Entered: [id number]             Message     Original authorizing provider: Kelton Pillar, MD        Ileana Ladd Wolven would like a refill of the following medications:    meclizine (ANTIVERT) 25 MG tablet [SCHOENHOFF,DEBBIE, MD]        Preferred pharmacy: [place], [address]        Comment:    Sorry I forgot to ask for refills on the temazepam. Please send to [facility]. I do not need the meclizine but it would not let me continue unless I checked it since the temazepam does show up.  Thank you  [name] Procedures      Open Future Orders       Priority Expected Expires Ordered    CT Chest Wo Contrast Routine  06/11/2015 04/10/2014    US Soft Tissue Head/Neck Routine  02/28/2015 12/28/2013           Normal Orders       Priority Ordered    Ambulatory referral to Gastroenterology Routine 04/10/2014    CULTURE, URINE COMPREHENSIVE Routine 04/10/2014    TSH Routine 04/10/2014    T4, free Routine 04/10/2014    Vit D 25 hydroxy (rtn osteoporosis monitoring) Routine 04/10/2014    T3, free Routine 04/10/2014    Ambulatory referral to ENT Routine 12/28/2013    Ambulatory referral to Dermatology Routine 12/28/2013    Ambulatory referral to Physical Therapy Routine 09/20/2013    CULTURE, URINE COMPREHENSIVE Routine 09/21/2012    Ambulatory referral to Urology Routine 08/23/2012    Urine culture Routine 07/26/2012

## 2014-04-12 ENCOUNTER — Other Ambulatory Visit: Payer: Self-pay | Admitting: Internal Medicine

## 2014-04-12 DIAGNOSIS — Z760 Encounter for issue of repeat prescription: Secondary | ICD-10-CM

## 2014-04-12 LAB — CULTURE, URINE COMPREHENSIVE
Colony Count: NO GROWTH
ORGANISM ID, BACTERIA: NO GROWTH

## 2014-04-12 MED ORDER — TEMAZEPAM 15 MG PO CAPS: ORAL_CAPSULE | ORAL | Status: DC

## 2014-04-12 NOTE — Telephone Encounter (Signed)
Verbal called in to [facility]

## 2014-04-16 ENCOUNTER — Other Ambulatory Visit: Payer: Self-pay | Admitting: Internal Medicine

## 2014-04-16 LAB — T3, FREE: T3, Free: 3.3 pg/mL (ref 2.3–4.2)

## 2014-04-16 LAB — TSH: TSH: 0.386 u[IU]/mL (ref 0.350–4.500)

## 2014-04-16 LAB — T4, FREE: FREE T4: 1.23 ng/dL (ref 0.80–1.80)

## 2014-04-17 LAB — VITAMIN D 25 HYDROXY (VIT D DEFICIENCY, FRACTURES): Vit D, 25-Hydroxy: 40 ng/mL (ref 30–89)

## 2014-04-19 LAB — CULTURE, URINE COMPREHENSIVE

## 2014-04-23 ENCOUNTER — Encounter: Payer: Self-pay | Admitting: *Deleted

## 2014-05-15 ENCOUNTER — Encounter: Payer: Self-pay | Admitting: Internal Medicine

## 2014-06-11 ENCOUNTER — Telehealth: Payer: Self-pay

## 2014-06-11 NOTE — Telephone Encounter (Signed)
Taylor Atkins [phone]  [name] called and she has had chest congestion, fever on and off for 2 weeks, she is wanting to come in on Thursday to be seen. She is off and can come anytime. Schedule is full so I wanting to check first.

## 2014-06-11 NOTE — Telephone Encounter (Signed)
I spoke with Taylor Atkins and set her up for Thursday morning-eh

## 2014-06-14 ENCOUNTER — Ambulatory Visit (INDEPENDENT_AMBULATORY_CARE_PROVIDER_SITE_OTHER): Payer: 59 | Admitting: Internal Medicine

## 2014-06-14 ENCOUNTER — Encounter: Payer: Self-pay | Admitting: Internal Medicine

## 2014-06-14 VITALS — BP 119/67 | HR 75 | Temp 98.5°F | Resp 15 | Ht 63.0 in | Wt 123.0 lb

## 2014-06-14 DIAGNOSIS — R059 Cough, unspecified: Secondary | ICD-10-CM

## 2014-06-14 DIAGNOSIS — J440 Chronic obstructive pulmonary disease with acute lower respiratory infection: Secondary | ICD-10-CM

## 2014-06-14 DIAGNOSIS — Z720 Tobacco use: Secondary | ICD-10-CM

## 2014-06-14 DIAGNOSIS — J209 Acute bronchitis, unspecified: Secondary | ICD-10-CM

## 2014-06-14 DIAGNOSIS — J441 Chronic obstructive pulmonary disease with (acute) exacerbation: Secondary | ICD-10-CM

## 2014-06-14 DIAGNOSIS — R05 Cough: Secondary | ICD-10-CM

## 2014-06-14 DIAGNOSIS — F17200 Nicotine dependence, unspecified, uncomplicated: Secondary | ICD-10-CM

## 2014-06-14 LAB — PEAK FLOW METER

## 2014-06-14 MED ORDER — PREDNISONE 20 MG PO TABS
ORAL_TABLET | ORAL | Status: DC
Start: 2014-06-14 — End: 2015-05-03

## 2014-06-14 MED ORDER — AZITHROMYCIN 250 MG PO TABS: ORAL_TABLET | ORAL | Status: DC

## 2014-06-14 MED ORDER — HYDROCODONE-HOMATROPINE 5-1.5 MG/5ML PO SYRP: 5.0000 mL | ORAL_SOLUTION | Freq: Three times a day (TID) | ORAL | Status: DC | PRN

## 2014-06-14 MED ORDER — METHYLPREDNISOLONE ACETATE 80 MG/ML IJ SUSP
160.0000 mg | Freq: Once | INTRAMUSCULAR | Status: AC
  Administered 2014-06-14: 160 mg via INTRAMUSCULAR

## 2014-06-14 MED ORDER — ALBUTEROL SULFATE HFA 108 (90 BASE) MCG/ACT IN AERS: INHALATION_SPRAY | RESPIRATORY_TRACT | Status: DC

## 2014-06-14 NOTE — Patient Instructions (Signed)
See me if any worsening

## 2014-06-14 NOTE — Progress Notes (Signed)
Subjective:    Patient ID: Taylor Atkins, female    DOB: [birth year], 58 y.o.   MRN: [id number]  HPI Prior note HM: RX given for Prevnar 13 and order given. Counseled new lung cancer guidelines and will order Screening Chest CT.. Pap next year Advised to get 3D mm Will refer to [facility] for screening colonoscopy.  MNG/ supressed TSH: Will check TSH and free levels today. Pt asymptomatic  Chronic hematuria: Extensive work-up with urology Continue with urology  Tobacco use Advised cessation Screening Chest CT ordered  Asymptomatic PVC"s  MNG  Hematuria Extensive work up Dr. McDiarmid with Contrasted CT and cystoscopy  I spent 45 mins with this pt      Today's visit  Taylor Atkins is here with acute visit    Several days of chest congestion , productive cough and low grade fever at home      No chest pain    Pos wheezing,  No SOB     "I feel a little better today "       Still smoking  Declines screening chest CT      Pt brings report of normal colonoscopy done in 2006 Dr. Shana Atkins     No Known Allergies Past Medical History  Diagnosis Date  . Anxiety   . Osteopenia   . Thyroid disease   . Goiter   . Tobacco use disorder   . Acne   . Skin cancer 6/15   Past Surgical History  Procedure Laterality Date  . Cesarean section  1990, 1993  . Bunionectomy  1978  . Tubal ligation  1993  . Hemorrhoid surgery  1994   History   Social History  . Marital Status: Married    Spouse Name: N/A    Number of Children: N/A  . Years of Education: N/A   Occupational History  . Not on file.   Social History Main Topics  . Smoking status: Current Every Day Smoker -- 1.00 packs/day for 40 years    Types: Cigarettes  . Smokeless tobacco: Never Used  . Alcohol Use: No  . Drug Use: No  . Sexual Activity: Yes   Other Topics Concern  . Not on file   Social History Narrative  . No narrative on file   Family History  Problem Relation Age of Onset  . Hypertension Mother     . Heart disease Father     Afib, pacemaker  . Hypertension Father   . Diabetes Brother   . Hypertension Brother   . Diabetes Brother   . Hypertension Brother    Patient Active Problem List   Diagnosis Date Noted  . Abnormal TSH 01/09/2014  . Multinodular goiter  see ultrasound  2014 12/28/2013  . Emphysema lung  CT finding 12/28/2013  . Vertigo 09/20/2013  . Renal calculus, left 08/23/2012  . COPD (chronic obstructive pulmonary disease) 08/22/2012  . Hematuria 07/26/2012  . Hyperinflation of lungs 07/26/2012  . Asymptomatic PVCs 07/26/2012  . Depression 07/13/2011  . Anxiety   . Osteopenia   . Thyroid disease   . Goiter   . Tobacco use disorder   . Acne   . ALLERGIC REACTION 02/07/2011   Current Outpatient Prescriptions on File Prior to Visit  Medication Sig Dispense Refill  . meclizine (ANTIVERT) 25 MG tablet Take one tablet every 8 hours as needed for dizziness  30 tablet  0  . temazepam (RESTORIL) 15 MG capsule TAKE 1 CAPSULE BY MOUTH AT BEDTIME AS NEEDED  FOR SLEEP  30 capsule  1  . temazepam (RESTORIL) 15 MG capsule TAKE 1 CAPSULE BY MOUTH AT BEDTIME AS NEEDED FOR SLEEP  30 capsule  1   No current facility-administered medications on file prior to visit.       Review of Systems See HPI    Objective:   Physical Exam Physical Exam  Nursing note and vitals reviewed.  Constitutional: She is oriented to person, place, and time. She appears well-developed and well-nourished.  HENT:  Head: Normocephalic and atraumatic.  Cardiovascular: Normal rate and regular rhythm. Exam reveals no gallop and no friction rub.  No murmur heard.  Pulmonary/Chest: Breath sounds normal. She has no wheezes. She has no rales.   End expiratory wheezing both bases  Neurological: She is alert and oriented to person, place, and time.  Skin: Skin is warm and dry.  Psychiatric: She has a normal mood and affect. Her behavior is normal.              Assessment & Plan:  Bronchitis   Will give Z-pak  Peak flow 125  Cough    Hycodan RX  Copd exacerbation   HHN albuterol in office  RX Proair 2 puffs q8h prn,    depomedrol 160 mg in office with oral prednsione  60mg  tpaer  High risk lung CA    I again advised screeniing Chest ct but pt declines due to finances   Tobacco use advised smoking cessation   Pt had colonoscopy done 2006  No polyps    See me as needed

## 2014-06-15 ENCOUNTER — Encounter: Payer: Self-pay | Admitting: *Deleted

## 2014-06-25 ENCOUNTER — Other Ambulatory Visit: Payer: Self-pay | Admitting: *Deleted

## 2014-06-25 DIAGNOSIS — Z760 Encounter for issue of repeat prescription: Secondary | ICD-10-CM

## 2014-06-25 NOTE — Telephone Encounter (Signed)
Refill request

## 2014-06-26 MED ORDER — TEMAZEPAM 15 MG PO CAPS: ORAL_CAPSULE | ORAL | Status: DC

## 2014-06-26 NOTE — Telephone Encounter (Signed)
RX called into [facility] [place] [facility].-eh

## 2014-07-09 ENCOUNTER — Encounter: Payer: Self-pay | Admitting: Internal Medicine

## 2014-07-27 ENCOUNTER — Ambulatory Visit (INDEPENDENT_AMBULATORY_CARE_PROVIDER_SITE_OTHER): Payer: 59 | Admitting: *Deleted

## 2014-07-27 DIAGNOSIS — Z23 Encounter for immunization: Secondary | ICD-10-CM

## 2014-08-21 ENCOUNTER — Telehealth: Payer: Self-pay | Admitting: *Deleted

## 2014-08-21 DIAGNOSIS — Z760 Encounter for issue of repeat prescription: Secondary | ICD-10-CM

## 2014-08-21 MED ORDER — TEMAZEPAM 15 MG PO CAPS: ORAL_CAPSULE | ORAL | Status: DC

## 2014-08-21 NOTE — Telephone Encounter (Signed)
Refilled Restoril per Dr. Coralyn Mark

## 2014-10-18 ENCOUNTER — Other Ambulatory Visit: Payer: Self-pay | Admitting: Internal Medicine

## 2014-10-18 NOTE — Telephone Encounter (Signed)
Refill request

## 2014-12-19 ENCOUNTER — Other Ambulatory Visit: Payer: Self-pay | Admitting: *Deleted

## 2014-12-19 MED ORDER — TEMAZEPAM 15 MG PO CAPS: ORAL_CAPSULE | ORAL | Status: DC

## 2014-12-19 NOTE — Telephone Encounter (Signed)
Refill request

## 2014-12-21 ENCOUNTER — Other Ambulatory Visit: Payer: Self-pay | Admitting: Internal Medicine

## 2014-12-21 NOTE — Telephone Encounter (Signed)
RX called in .

## 2015-05-01 ENCOUNTER — Other Ambulatory Visit: Payer: Self-pay | Admitting: *Deleted

## 2015-05-01 MED ORDER — AZITHROMYCIN 250 MG PO TABS: ORAL_TABLET | ORAL | Status: DC

## 2015-05-03 ENCOUNTER — Telehealth: Payer: Self-pay | Admitting: Behavioral Health

## 2015-05-03 ENCOUNTER — Encounter: Payer: Self-pay | Admitting: Behavioral Health

## 2015-05-03 NOTE — Telephone Encounter (Signed)
Pre-Visit Call completed with patient and chart updated.   Pre-Visit Info documented in Specialty Comments under [employer].    

## 2015-05-06 ENCOUNTER — Ambulatory Visit (INDEPENDENT_AMBULATORY_CARE_PROVIDER_SITE_OTHER): Payer: 59 | Admitting: Family

## 2015-05-06 ENCOUNTER — Ambulatory Visit (HOSPITAL_BASED_OUTPATIENT_CLINIC_OR_DEPARTMENT_OTHER)
Admission: RE | Admit: 2015-05-06 | Discharge: 2015-05-06 | Disposition: A | Discharge status: Home / Self Care | Payer: 59 | Source: Ambulatory Visit | Attending: Family | Admitting: Family

## 2015-05-06 ENCOUNTER — Encounter: Payer: Self-pay | Admitting: Family

## 2015-05-06 VITALS — BP 110/70 | HR 78 | Temp 98.1°F | Resp 16 | Ht 63.0 in | Wt 124.2 lb

## 2015-05-06 DIAGNOSIS — Z0000 Encounter for general adult medical examination without abnormal findings: Secondary | ICD-10-CM

## 2015-05-06 DIAGNOSIS — J441 Chronic obstructive pulmonary disease with (acute) exacerbation: Secondary | ICD-10-CM

## 2015-05-06 DIAGNOSIS — M8580 Other specified disorders of bone density and structure, unspecified site: Secondary | ICD-10-CM

## 2015-05-06 DIAGNOSIS — E042 Nontoxic multinodular goiter: Secondary | ICD-10-CM

## 2015-05-06 DIAGNOSIS — G4700 Insomnia, unspecified: Secondary | ICD-10-CM

## 2015-05-06 DIAGNOSIS — Z720 Tobacco use: Secondary | ICD-10-CM

## 2015-05-06 DIAGNOSIS — J440 Chronic obstructive pulmonary disease with acute lower respiratory infection: Secondary | ICD-10-CM

## 2015-05-06 DIAGNOSIS — F17200 Nicotine dependence, unspecified, uncomplicated: Secondary | ICD-10-CM | POA: Diagnosis not present

## 2015-05-06 DIAGNOSIS — E559 Vitamin D deficiency, unspecified: Secondary | ICD-10-CM | POA: Diagnosis not present

## 2015-05-06 DIAGNOSIS — R05 Cough: Secondary | ICD-10-CM | POA: Insufficient documentation

## 2015-05-06 DIAGNOSIS — R319 Hematuria, unspecified: Secondary | ICD-10-CM

## 2015-05-06 DIAGNOSIS — J449 Chronic obstructive pulmonary disease, unspecified: Secondary | ICD-10-CM | POA: Insufficient documentation

## 2015-05-06 DIAGNOSIS — Z85828 Personal history of other malignant neoplasm of skin: Secondary | ICD-10-CM | POA: Insufficient documentation

## 2015-05-06 DIAGNOSIS — R42 Dizziness and giddiness: Secondary | ICD-10-CM

## 2015-05-06 LAB — VITAMIN D 25 HYDROXY (VIT D DEFICIENCY, FRACTURES): VITD: 36.34 ng/mL (ref 30.00–100.00)

## 2015-05-06 LAB — T3, FREE: T3, Free: 3.2 pg/mL (ref 2.3–4.2)

## 2015-05-06 LAB — TSH: TSH: 0.37 u[IU]/mL (ref 0.35–4.50)

## 2015-05-06 LAB — T4, FREE: Free T4: 0.97 ng/dL (ref 0.60–1.60)

## 2015-05-06 MED ORDER — ALBUTEROL SULFATE HFA 108 (90 BASE) MCG/ACT IN AERS: INHALATION_SPRAY | RESPIRATORY_TRACT | Status: DC

## 2015-05-06 MED ORDER — TEMAZEPAM 15 MG PO CAPS: ORAL_CAPSULE | ORAL | Status: DC

## 2015-05-06 MED ORDER — PREDNISONE 10 MG PO TABS: ORAL_TABLET | ORAL | Status: DC

## 2015-05-06 NOTE — Assessment & Plan Note (Signed)
Obtain follow up goiter to ensure stability. Obtain TFT's.

## 2015-05-06 NOTE — Progress Notes (Signed)
Subjective:    Patient ID: Taylor Atkins, female    DOB: February 28, 1956, 59 y.o.   MRN: [id number]  HPI  [name] is a 59 yr old female who presents today to establish care.  She is transferring care from Dr. Coralyn Mark.    Pmhx is significant for the following  1) Thyroid disease (Goiter)- multinodular goiter. -borderline hyperthryoid. Neg biopsy 2012.  2) COPD- ongoing smoker 1 PPD x 40 pack-years (still smoking).    3) Depression- no current issues. Was situational.   4) Hematuria- noted on Urine dip 8/15.  She reports that she saw urology and had cystoscopy.  Had a contrasted CT which was negative.   5) Vertigo- rare mild symptoms recently.   6) Insomnia- temazepam is helping her insomnia.  She has hx of thyroid biopsy in the past which was negative. Uses every night.   7) Skin Cancer-  (squamous cell carcinoma). Had lesion removed left anterior shoulder, sees Dr. Renda Rolls.   8) Osteopenia- hx of vit D deficiency.   Review of Systems  Constitutional: Negative for unexpected weight change.  HENT: Negative for rhinorrhea.   Respiratory: Negative for cough.        Reports that last week she was given z pak on video visit.  + Chest congestion/wheezing.  Completed zpak on Saturday 8/27.  Cough is a "little bit better.  No recent fever.   Cardiovascular: Negative for leg swelling.  Gastrointestinal:       Notes some abdominal bloating, constipation. Uses stool softener at night  Genitourinary: Negative for dysuria and frequency.  Musculoskeletal: Negative for joint swelling.       Mild hip pain  Skin: Negative for rash.  Neurological: Negative for headaches.  Hematological:       She reports a chronically swollen LN left upper anterior cervical region  Psychiatric/Behavioral:       Denies anxiety/depression    Past Medical History  Diagnosis Date  . Anxiety   . Osteopenia   . Thyroid disease   . Goiter   . Tobacco use disorder   . Acne   . Skin cancer 6/15      Social History   Social History  . Marital Status: Married    Spouse Name: N/A  . Number of Children: N/A  . Years of Education: N/A   Occupational History  . Not on file.   Social History Main Topics  . Smoking status: Current Every Day Smoker -- 1.00 packs/day for 40 years    Types: Cigarettes  . Smokeless tobacco: Never Used  . Alcohol Use: No  . Drug Use: No  . Sexual Activity: Yes   Other Topics Concern  . Not on file   Social History Narrative    Past Surgical History  Procedure Laterality Date  . Bunionectomy  1978  . Tubal ligation  1993  . Hemorrhoid surgery  1994  . Cesarean section  1990, 1993    Family History  Problem Relation Age of Onset  . Hypertension Mother   . Heart disease Father     Afib, pacemaker  . Hypertension Father   . Diabetes Brother   . Hypertension Brother   . Diabetes Brother   . Hypertension Brother     No Known Allergies  Current Outpatient Prescriptions on File Prior to Visit  Medication Sig Dispense Refill  . albuterol (PROVENTIL HFA;VENTOLIN HFA) 108 (90 BASE) MCG/ACT inhaler Inhale 2 inhalations into lungs q8h prn wheezing 1 Inhaler 1  .  meclizine (ANTIVERT) 25 MG tablet Take one tablet every 8 hours as needed for dizziness 30 tablet 0  . temazepam (RESTORIL) 15 MG capsule TAKE 1 CAPSULE BY MOUTH AT BEDTIME AS NEEDED FOR SLEEP 30 capsule 1   No current facility-administered medications on file prior to visit.    BP 110/70 mmHg  Pulse 78  Temp(Src) 98.1 F (36.7 C) (Oral)  Resp 16  Ht 5\' 3"  (1.6 m)  Wt 124 lb 3.2 oz (56.337 kg)  BMI 22.01 kg/m2  SpO2 97%       Objective:   Physical Exam  Constitutional: She is oriented to person, place, and time.  Appears older than stated age.   HENT:  Head: Normocephalic and atraumatic.  Right Ear: Tympanic membrane and ear canal normal.  Left Ear: Tympanic membrane and ear canal normal.  Mouth/Throat: No oropharyngeal exudate, posterior oropharyngeal edema or  posterior oropharyngeal erythema.  Neck: Neck supple. Thyromegaly present.  Cardiovascular: Normal rate and regular rhythm.   No murmur heard. Pulmonary/Chest: Effort normal. No respiratory distress. She has wheezes. She has no rales.  Bilateral wheezing noted  Musculoskeletal: She exhibits no edema.  Lymphadenopathy:    She has no cervical adenopathy.  Neurological: She is alert and oriented to person, place, and time.  Skin: Skin is warm. No erythema.  Psychiatric: She has a normal mood and affect. Her behavior is normal. Judgment and thought content normal.          Assessment & Plan:

## 2015-05-06 NOTE — Assessment & Plan Note (Signed)
Pt counseled on importance of tobacco cessation.  Not motivated to quit at this time.

## 2015-05-06 NOTE — Assessment & Plan Note (Signed)
A controlled substance contract is signed and pt is give refill of temazepam, obtain UDS.

## 2015-05-06 NOTE — Patient Instructions (Signed)
Start prednisone taper. Take albuterol inhaler every 6 hours for the next few days then as needed. Complete chest x ray today on the first floor- they can help you schedule other imaging studies as well. Complete lab work prior to leaving (UDS). Schedule complete physical this fall. Welcome to [facility]!

## 2015-05-06 NOTE — Assessment & Plan Note (Signed)
+   hx of nephrolithiasis.  Had work up with urology neg for malignancy.

## 2015-05-06 NOTE — Assessment & Plan Note (Signed)
Obtain follow up dexa scan.

## 2015-05-06 NOTE — Assessment & Plan Note (Signed)
Stable with rare use of meclizine.

## 2015-05-06 NOTE — Progress Notes (Signed)
Pre visit review using our clinic review tool, if applicable. No additional management support is needed unless otherwise documented below in the visit note. 

## 2015-05-06 NOTE — Assessment & Plan Note (Signed)
CXR negative of pneumonia.  Will rx with pred taper.

## 2015-05-08 ENCOUNTER — Telehealth: Payer: Self-pay | Admitting: Family

## 2015-05-08 NOTE — Telephone Encounter (Signed)
Relation to pt: self  Call back number: [phone]   Reason for call:  Patient returning your call regarding lab results

## 2015-05-09 ENCOUNTER — Encounter: Payer: Self-pay | Admitting: Family

## 2015-05-10 NOTE — Telephone Encounter (Signed)
Spoke with pt. She confirmed that she received results via mychart. Doesn't remember calling my about test results, has no other concerns at this time.

## 2015-05-16 ENCOUNTER — Encounter: Payer: Self-pay | Admitting: Medical

## 2015-05-16 ENCOUNTER — Encounter: Payer: Self-pay | Admitting: Family

## 2015-05-16 ENCOUNTER — Ambulatory Visit (INDEPENDENT_AMBULATORY_CARE_PROVIDER_SITE_OTHER): Payer: 59 | Admitting: Medical

## 2015-05-16 VITALS — BP 106/68 | HR 81 | Temp 98.2°F | Resp 16 | Ht 63.0 in | Wt 126.0 lb

## 2015-05-16 DIAGNOSIS — R062 Wheezing: Secondary | ICD-10-CM

## 2015-05-16 DIAGNOSIS — J208 Acute bronchitis due to other specified organisms: Secondary | ICD-10-CM | POA: Diagnosis not present

## 2015-05-16 MED ORDER — DOXYCYCLINE HYCLATE 100 MG PO TABS: 100.0000 mg | ORAL_TABLET | Freq: Two times a day (BID) | ORAL | Status: DC

## 2015-05-16 MED ORDER — BENZONATATE 200 MG PO CAPS: 200.0000 mg | ORAL_CAPSULE | Freq: Three times a day (TID) | ORAL | Status: DC | PRN

## 2015-05-16 MED ORDER — FLUTICASONE PROPIONATE 50 MCG/ACT NA SUSP: 2.0000 | Freq: Every day | NASAL | Status: DC

## 2015-05-16 MED ORDER — BECLOMETHASONE DIPROPIONATE 40 MCG/ACT IN AERS: 2.0000 | INHALATION_SPRAY | Freq: Two times a day (BID) | RESPIRATORY_TRACT | Status: DC

## 2015-05-16 NOTE — Patient Instructions (Addendum)
Will rx doxycycline antibiotic for persisting bronchitis type symptoms. Benzonatate for cough. Rx qvar for the wheezing since your are tapering off prednisone. Albuterol continue as needed  Follow up in 7- 10 days or as needed.

## 2015-05-16 NOTE — Progress Notes (Signed)
Subjective:    Patient ID: Taylor Atkins, female    DOB: [birth year], 59 y.o.   MRN: [id number]  HPI  Pt in with some nasal  and chest congestion. Pt states her symptoms have been going on for 2wks. Pt states she finished z-pack 2 days before she saw [facility] NP.Melissa gave her some prednisone. Pt at tail end of the prednisone.   Pt coughing as well. Pt has some rt rib area or maybe muscle pain from coughing. Pt is wheezing occasional and using albuterol about twice a day since last visit. Last cxr was clear.   Pt has history of smoking and about half a pack a day.  Pt has some occasional sweats but no fever. No chills.     Review of Systems  Constitutional: Positive for diaphoresis. Negative for fever and chills.  HENT: Positive for congestion.   Respiratory: Positive for cough and wheezing.   Cardiovascular: Negative for chest pain and palpitations.  Musculoskeletal: Negative for back pain.  Neurological: Negative for dizziness, syncope, weakness and numbness.  Psychiatric/Behavioral: Negative for behavioral problems, confusion and agitation.    Past Medical History  Diagnosis Date  . Anxiety   . Osteopenia   . Thyroid disease   . Goiter   . Tobacco use disorder   . Acne   . Skin cancer 6/15    Social History   Social History  . Marital Status: Married    Spouse Name: N/A  . Number of Children: N/A  . Years of Education: N/A   Occupational History  . Not on file.   Social History Main Topics  . Smoking status: Current Every Day Smoker -- 1.00 packs/day for 40 years    Types: Cigarettes  . Smokeless tobacco: Never Used  . Alcohol Use: No  . Drug Use: No  . Sexual Activity: Yes   Other Topics Concern  . 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 Bunionectomy  1978  . Tubal ligation  1993  . Hemorrhoid surgery  1994  . Cesarean section  1990, 1993    Family History  Problem Relation Age of Onset  . Hypertension Mother     Afib, living  . Heart disease Father     Afib, pacemaker, PVD  . Hypertension Father   . Diabetes Brother   . Hypertension Brother   . Diabetes Brother   . Hypertension Brother     No Known Allergies  Current Outpatient Prescriptions on File Prior to Visit  Medication Sig Dispense Refill  . albuterol (PROVENTIL HFA;VENTOLIN HFA) 108 (90 BASE) MCG/ACT inhaler Inhale 2 inhalations into lungs q8h prn wheezing 1 Inhaler 2  . Docusate Calcium (STOOL SOFTENER PO) Take 1 tablet by mouth at bedtime.    . meclizine (ANTIVERT) 25 MG tablet Take one tablet every 8 hours as needed for dizziness 30 tablet 0  . predniSONE (DELTASONE) 10 MG tablet 4 tabs by mouth once daily x 3 days, then 3 tabs once daily x 3 days, then 2 tabs once daily x 3 days, then 1 tab daily x 3 days. 30 tablet 0  . temazepam (RESTORIL) 15 MG capsule TAKE 1 CAPSULE BY  MOUTH AT BEDTIME AS NEEDED FOR SLEEP 30 capsule 0   No current facility-administered medications on file prior to visit.    BP 106/68 mmHg  Pulse 81  Temp(Src) 98.2 F (36.8 C) (Oral)  Resp 16  Ht 5\' 3"  (1.6 m)  Wt 126 lb (57.153 kg)  BMI 22.33 kg/m2  SpO2 98%       Objective:   Physical Exam  General  Mental Status - Alert. General Appearance - Well groomed. Not in acute distress.  Skin Rashes- No Rashes.  HEENT Head- Normal. Ear Auditory Canal - Left- Normal. Right - Normal.Tympanic Membrane- Left- Normal. Right- Normal. Eye Sclera/Conjunctiva- Left- Normal. Right- Normal. Nose & Sinuses Nasal Mucosa- Left-  boggy + Congested. Right-    boggy + Congested. Mouth & Throat Lips: Upper Lip- Normal: no dryness, cracking, pallor, cyanosis, or vesicular eruption. Lower Lip-Normal: no dryness, cracking, pallor, cyanosis or  vesicular eruption. Buccal Mucosa- Bilateral- No Aphthous ulcers. Oropharynx- No Discharge or Erythema. Tonsils: Characteristics- Bilateral- No Erythema or Congestion. Size/Enlargement- Bilateral- No enlargement. Discharge- bilateral-None.  Neck Neck- Supple. No Masses.   Chest and Lung Exam Auscultation: Breath Sounds:- even and unlabored, but bilateral upper lobe rhonchi.  Cardiovascular Auscultation:Rythm- Regular, rate and rhythm. Murmurs & Other Heart Sounds:Ausculatation of the heart reveal- No Murmurs.  Lymphatic Head & Neck General Head & Neck Lymphatics: Bilateral: Description- No Localized lymphadenopathy.       Assessment & Plan:  Will rx doxycycline antibiotic for persisting bronchitis type symptoms. Benzonatate for cough. Rx qvar for the wheezing since your are tapering off prednisone. Albuterol continue as needed  Follow up in 7- 10 days or as needed.

## 2015-05-16 NOTE — Telephone Encounter (Signed)
Appointment scheduled with Mackie Pai, PA-C today (05/16/15) at 4:45 pm.  She was advised to hold off on zpak until seen today.  She stated understanding and agreed.

## 2015-05-16 NOTE — Telephone Encounter (Signed)
Caryl Pina, could you please contact pt and arrange follow up in the office with an available provider?  Ideally today, but tomorrow OK if no openings today.  I would advise her to hold off on starting zpak until she is seen in the office (see pt mychart message). Thanks.

## 2015-05-16 NOTE — Progress Notes (Signed)
Pre visit review using our clinic review tool, if applicable. No additional management support is needed unless otherwise documented below in the visit note. 

## 2015-05-24 ENCOUNTER — Encounter: Payer: Self-pay | Admitting: Family

## 2015-05-24 ENCOUNTER — Ambulatory Visit (HOSPITAL_BASED_OUTPATIENT_CLINIC_OR_DEPARTMENT_OTHER)
Admission: RE | Admit: 2015-05-24 | Discharge: 2015-05-24 | Disposition: A | Discharge status: Home / Self Care | Payer: 59 | Source: Ambulatory Visit | Attending: Family | Admitting: Family

## 2015-05-24 ENCOUNTER — Ambulatory Visit (INDEPENDENT_AMBULATORY_CARE_PROVIDER_SITE_OTHER): Payer: 59 | Admitting: Family

## 2015-05-24 VITALS — BP 102/70 | HR 78 | Temp 98.3°F | Resp 16 | Ht 63.0 in | Wt 122.4 lb

## 2015-05-24 DIAGNOSIS — R05 Cough: Secondary | ICD-10-CM

## 2015-05-24 DIAGNOSIS — R0781 Pleurodynia: Secondary | ICD-10-CM

## 2015-05-24 DIAGNOSIS — R059 Cough, unspecified: Secondary | ICD-10-CM

## 2015-05-24 NOTE — Progress Notes (Signed)
Subjective:    Patient ID: Taylor Atkins, female    DOB: Sep 21, 1955, 59 y.o.   MRN: [id number]  HPI  Ms. Shutt is a 59 yr old female who presents today with chief complaint of cough. She saw Percell Miller on 05/16/15.   Cough started 3+ weeks ago. Initially treated with Zpak, completedon 8/27. Saw me on 8/29, She was given pred taper on 05/06/15.  Saw Evern Core PA-C on 05/16/15 with persistent cough. Was given rx for tessalon perles and doxycycline.  Today she reports + pain right rib cage- worse with lifting purse, cough, deep breath.  She reports ongoing productive cough. She continued nasal drainage. She reports thick white nasal drainage.  Started qvar.  She reports occasional wheezing. Has had some chills and hot flashes, but denies known fever. Not sleeping well due to cough. Energy is poor. She continues to smoke.   Review of Systems See HPI  Past Medical History  Diagnosis Date  . Anxiety   . Osteopenia   . Thyroid disease   . Goiter   . Tobacco use disorder   . Acne   . Skin cancer 6/15    Social History   Social History  . Marital Status: Married    Spouse Name: N/A  . Number of Children: N/A  . Years of Education: N/A   Occupational History  . Not on file.   Social History Main Topics  . Smoking status: Current Every Day Smoker -- 1.00 packs/day for 40 years    Types: Cigarettes  . Smokeless tobacco: Never Used  . Alcohol Use: No  . Drug Use: No  . Sexual Activity: Yes   Other Topics Concern  . 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 Bunionectomy  1978  . Tubal ligation  1993  . Hemorrhoid surgery  1994  . Cesarean section  1990, 1993    Family History  Problem Relation Age of Onset  . Hypertension  Mother     Afib, living  . Heart disease Father     Afib, pacemaker, PVD  . Hypertension Father   . Diabetes Brother   . Hypertension Brother   . Diabetes Brother   . Hypertension Brother     No Known Allergies  Current Outpatient Prescriptions on File Prior to Visit  Medication Sig Dispense Refill  . albuterol (PROVENTIL HFA;VENTOLIN HFA) 108 (90 BASE) MCG/ACT inhaler Inhale 2 inhalations into lungs q8h prn wheezing 1 Inhaler 2  . beclomethasone (QVAR) 40 MCG/ACT inhaler Inhale 2 puffs into the lungs 2 (two) times daily. 1 Inhaler 12  . benzonatate (TESSALON) 200 MG capsule Take 1 capsule (200 mg total) by mouth 3 (three) times daily as needed for cough. 21 capsule 0  . Docusate Calcium (STOOL SOFTENER PO) Take 1 tablet by mouth at bedtime.    Marland Kitchen doxycycline (VIBRA-TABS) 100 MG tablet Take 1 tablet (100 mg total) by mouth 2 (two) times daily. 20 tablet 0  . fluticasone (FLONASE) 50 MCG/ACT nasal spray Place 2 sprays into both nostrils daily. 16 g 1  . meclizine (ANTIVERT) 25 MG tablet Take one tablet every 8 hours as needed for dizziness 30 tablet 0  .  predniSONE (DELTASONE) 10 MG tablet 4 tabs by mouth once daily x 3 days, then 3 tabs once daily x 3 days, then 2 tabs once daily x 3 days, then 1 tab daily x 3 days. 30 tablet 0  . temazepam (RESTORIL) 15 MG capsule TAKE 1 CAPSULE BY MOUTH AT BEDTIME AS NEEDED FOR SLEEP 30 capsule 0   No current facility-administered medications on file prior to visit.    BP 102/70 mmHg  Pulse 78  Temp(Src) 98.3 F (36.8 C) (Oral)  Resp 16  Ht 5\' 3"  (1.6 m)  Wt 122 lb 6.4 oz (55.52 kg)  BMI 21.69 kg/m2  SpO2 97%       Objective:   Physical Exam  Constitutional: She appears well-developed and well-nourished.  Eyes: No scleral icterus.  Cardiovascular: Normal rate, regular rhythm and normal heart sounds.   No murmur heard. Pulmonary/Chest: Effort normal. No respiratory distress. She has no wheezes.  Diminished breath sounds throughout    Lymphadenopathy:    She has no cervical adenopathy.  Psychiatric: She has a normal mood and affect. Her behavior is normal. Judgment and thought content normal.          Assessment & Plan:  Cough- suspect related to allergic rhinitis. Add claritin, continue flonase, continue qvar, rec trial of omeprazole 20mg  otc PPI as well- in case gerd is contributing factor.  Obtain cxr to rule out pna. If symptoms worsen or do not improve consider pulmonary consult.     Pleuritic chest pain- advised short course of aleve 220mg  twice daily. ? Pleurisy. Discussed importance of quitting smoking.

## 2015-05-24 NOTE — Progress Notes (Signed)
Pre visit review using our clinic review tool, if applicable. No additional management support is needed unless otherwise documented below in the visit note. 

## 2015-05-24 NOTE — Patient Instructions (Addendum)
Complete chest x ray on the first floor. Add claritin 10mg  once daily. Try adding prilosec 20mg  once daily for the next 2 weeks (for possible reflux).  Continue flonase 2 sprays each nostril once daily. Continue tessalon as needed for cough. For chest pain- try aleve 220mg  twice daily as needed. Keep working on quitting smoking. Call if symptoms worsen or do not improve.

## 2015-05-28 ENCOUNTER — Other Ambulatory Visit: Payer: Self-pay | Admitting: Family

## 2015-05-28 ENCOUNTER — Ambulatory Visit (HOSPITAL_BASED_OUTPATIENT_CLINIC_OR_DEPARTMENT_OTHER)
Admission: RE | Admit: 2015-05-28 | Discharge: 2015-05-28 | Disposition: A | Discharge status: Home / Self Care | Payer: 59 | Source: Ambulatory Visit | Attending: Family | Admitting: Family

## 2015-05-28 ENCOUNTER — Ambulatory Visit (HOSPITAL_BASED_OUTPATIENT_CLINIC_OR_DEPARTMENT_OTHER): Payer: 59

## 2015-05-28 DIAGNOSIS — M810 Age-related osteoporosis without current pathological fracture: Secondary | ICD-10-CM | POA: Diagnosis not present

## 2015-05-28 DIAGNOSIS — Z1231 Encounter for screening mammogram for malignant neoplasm of breast: Secondary | ICD-10-CM | POA: Insufficient documentation

## 2015-05-28 DIAGNOSIS — E042 Nontoxic multinodular goiter: Secondary | ICD-10-CM | POA: Insufficient documentation

## 2015-05-28 DIAGNOSIS — M8580 Other specified disorders of bone density and structure, unspecified site: Secondary | ICD-10-CM

## 2015-05-28 DIAGNOSIS — Z0000 Encounter for general adult medical examination without abnormal findings: Secondary | ICD-10-CM

## 2015-05-28 MED ORDER — CALCIUM CARBONATE-VITAMIN D 600-400 MG-UNIT PO CHEW: 1.0000 | CHEWABLE_TABLET | Freq: Two times a day (BID) | ORAL | Status: AC

## 2015-05-28 MED ORDER — ALENDRONATE SODIUM 70 MG PO TABS: 70.0000 mg | ORAL_TABLET | ORAL | Status: DC

## 2015-05-28 NOTE — Telephone Encounter (Signed)
Bone density test shows bone thinning.  For bone health, I recommend that she stop smoking, ensure regular weight bearing exercise such as walking, start weekly fosamax and caltrate 600mg  +D twice daily. Ultrasound show multiple thyroid nodules. The larger right thyroid nodule which was biopsied back in 2012 appears unchanged.

## 2015-05-28 NOTE — Telephone Encounter (Signed)
Notified pt and she voices understanding. Rxs sent. 

## 2015-06-17 ENCOUNTER — Ambulatory Visit (INDEPENDENT_AMBULATORY_CARE_PROVIDER_SITE_OTHER): Payer: 59 | Admitting: Family

## 2015-06-17 ENCOUNTER — Encounter: Payer: Self-pay | Admitting: Family

## 2015-06-17 ENCOUNTER — Other Ambulatory Visit (HOSPITAL_COMMUNITY)
Admission: RE | Admit: 2015-06-17 | Discharge: 2015-06-17 | Disposition: A | Discharge status: Home / Self Care | Payer: 59 | Source: Ambulatory Visit | Attending: Family | Admitting: Family

## 2015-06-17 VITALS — BP 112/60 | HR 76 | Temp 98.2°F | Resp 16 | Ht 63.0 in | Wt 125.2 lb

## 2015-06-17 DIAGNOSIS — Z01419 Encounter for gynecological examination (general) (routine) without abnormal findings: Secondary | ICD-10-CM | POA: Diagnosis not present

## 2015-06-17 DIAGNOSIS — Z0000 Encounter for general adult medical examination without abnormal findings: Secondary | ICD-10-CM | POA: Diagnosis not present

## 2015-06-17 DIAGNOSIS — Z1151 Encounter for screening for human papillomavirus (HPV): Secondary | ICD-10-CM | POA: Diagnosis not present

## 2015-06-17 LAB — BASIC METABOLIC PANEL
BUN: 9 mg/dL (ref 6–23)
CALCIUM: 9.5 mg/dL (ref 8.4–10.5)
CHLORIDE: 105 meq/L (ref 96–112)
CO2: 30 mEq/L (ref 19–32)
CREATININE: 0.81 mg/dL (ref 0.40–1.20)
GFR: 76.25 mL/min (ref >60.00)
Glucose, Bld: 93 mg/dL (ref 70–99)
Potassium: 4.3 mEq/L (ref 3.5–5.1)
Sodium: 141 mEq/L (ref 135–145)

## 2015-06-17 LAB — LIPID PANEL
CHOL/HDL RATIO: 3
CHOLESTEROL: 171 mg/dL (ref 0–200)
HDL: 52.4 mg/dL (ref >39.00)
LDL Cholesterol: 108 mg/dL — ABNORMAL HIGH (ref 0–99)
NonHDL: 118.44
TRIGLYCERIDES: 52 mg/dL (ref 0.0–149.0)
VLDL: 10.4 mg/dL (ref 0.0–40.0)

## 2015-06-17 LAB — CBC WITH DIFFERENTIAL/PLATELET
BASOS ABS: 0 10*3/uL (ref 0.0–0.1)
BASOS PCT: 0.5 % (ref 0.0–3.0)
EOS ABS: 0.1 10*3/uL (ref 0.0–0.7)
Eosinophils Relative: 1.9 % (ref 0.0–5.0)
HCT: 42.4 % (ref 36.0–46.0)
HEMOGLOBIN: 14.2 g/dL (ref 12.0–15.0)
LYMPHS PCT: 34.6 % (ref 12.0–46.0)
Lymphs Abs: 2.1 10*3/uL (ref 0.7–4.0)
MCHC: 33.5 g/dL (ref 30.0–36.0)
MCV: 89.5 fl (ref 78.0–100.0)
MONO ABS: 0.5 10*3/uL (ref 0.1–1.0)
Monocytes Relative: 7.4 % (ref 3.0–12.0)
NEUTROS ABS: 3.5 10*3/uL (ref 1.4–7.7)
Neutrophils Relative %: 55.6 % (ref 43.0–77.0)
PLATELETS: 232 10*3/uL (ref 150.0–400.0)
RBC: 4.73 Mil/uL (ref 3.87–5.11)
RDW: 13.7 % (ref 11.5–15.5)
WBC: 6.2 10*3/uL (ref 4.0–10.5)

## 2015-06-17 LAB — URINALYSIS, ROUTINE W REFLEX MICROSCOPIC
BILIRUBIN URINE: NEGATIVE
KETONES UR: NEGATIVE
LEUKOCYTES UA: NEGATIVE
NITRITE: NEGATIVE
Specific Gravity, Urine: <=1.005 — AB (ref 1.000–1.030)
Total Protein, Urine: NEGATIVE
UROBILINOGEN UA: 0.2 (ref 0.0–1.0)
Urine Glucose: NEGATIVE
pH: 6.5 (ref 5.0–8.0)

## 2015-06-17 LAB — HEPATIC FUNCTION PANEL
ALK PHOS: 73 U/L (ref 39–117)
ALT: 8 U/L (ref 0–35)
AST: 15 U/L (ref 0–37)
Albumin: 4.3 g/dL (ref 3.5–5.2)
BILIRUBIN DIRECT: 0 mg/dL (ref 0.0–0.3)
BILIRUBIN TOTAL: 0.3 mg/dL (ref 0.2–1.2)
Total Protein: 6.7 g/dL (ref 6.0–8.3)

## 2015-06-17 MED ORDER — TEMAZEPAM 15 MG PO CAPS: ORAL_CAPSULE | ORAL | Status: DC

## 2015-06-17 NOTE — Progress Notes (Signed)
Subjective:    Patient ID: Taylor Atkins, female    DOB: 24-Oct-1955, 59 y.o.   MRN: [id number]  HPI  Patient presents today for complete physical.  Immunizations:  Tetanus up to date, due for flu (will get with employer), due for shingles.   Diet: diet is not healthy- does not eat dinner [place]: [place] Colonoscopy: colo 2006- due Dexa: 9/16 Pap Smear: 2013- due Orange- up to date  Review of Systems  Constitutional: Negative for unexpected weight change.  HENT: Negative for hearing loss and rhinorrhea.   Eyes: Negative for visual disturbance.  Respiratory:       Mild cough- improved.   Cardiovascular: Negative for leg swelling.  Gastrointestinal: Negative for diarrhea.       Uses stool softner  Genitourinary: Negative for dysuria and frequency.  Musculoskeletal: Negative for myalgias and arthralgias.  Skin: Negative for rash.  Neurological: Negative for headaches.  Hematological: Negative for adenopathy.  Psychiatric/Behavioral:       Denies depression/anxiety   Past Medical History  Diagnosis Date  . Anxiety   . Osteopenia   . Thyroid disease   . Goiter   . Tobacco use disorder   . Acne   . Skin cancer 6/15    Social History   Social History  . Marital Status: Married    Spouse Name: N/A  . Number of Children: N/A  . Years of Education: N/A   Occupational History  . Not on file.   Social History Main Topics  . Smoking status: Current Every Day Smoker -- 1.00 packs/day for 40 years    Types: Cigarettes  . Smokeless tobacco: Never Used  . Alcohol Use: No  . Drug Use: No  . Sexual Activity: Yes   Other Topics Concern  . 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 Bunionectomy   1978  . Tubal ligation  1993  . Hemorrhoid surgery  1994  . Cesarean section  1990, 1993    Family History  Problem Relation Age of Onset  . Hypertension Mother     Afib, living  . Heart disease Father     Afib, pacemaker, PVD  . Hypertension Father   . Diabetes Brother   . Hypertension Brother   . Diabetes Brother   . Hypertension Brother     No Known Allergies  Current Outpatient Prescriptions on File Prior to Visit  Medication Sig Dispense Refill  . albuterol (PROVENTIL HFA;VENTOLIN HFA) 108 (90 BASE) MCG/ACT inhaler Inhale 2 inhalations into lungs q8h prn wheezing 1 Inhaler 2  . alendronate (FOSAMAX) 70 MG tablet Take 1 tablet (70 mg total) by mouth every 7 (seven) days. Take with a full glass of water on an empty stomach. 4 tablet 11  . beclomethasone (QVAR) 40 MCG/ACT inhaler Inhale 2 puffs into the lungs 2 (two) times daily. 1 Inhaler 12  . benzonatate (TESSALON) 200 MG capsule Take 1 capsule (200 mg total) by mouth 3 (three) times daily as needed for cough. 21 capsule 0  . Calcium Carbonate-Vitamin D 600-400 MG-UNIT per chew tablet Chew 1 tablet by mouth 2 (two) times daily.    . Docusate Calcium (STOOL  SOFTENER PO) Take 1 tablet by mouth at bedtime.    . fluticasone (FLONASE) 50 MCG/ACT nasal spray Place 2 sprays into both nostrils daily. 16 g 1  . meclizine (ANTIVERT) 25 MG tablet Take one tablet every 8 hours as needed for dizziness 30 tablet 0  . temazepam (RESTORIL) 15 MG capsule TAKE 1 CAPSULE BY MOUTH AT BEDTIME AS NEEDED FOR SLEEP 30 capsule 0   No current facility-administered medications on file prior to visit.    BP 112/60 mmHg  Pulse 76  Temp(Src) 98.2 F (36.8 C) (Oral)  Resp 16  Ht 5\' 3"  (1.6 m)  Wt 125 lb 3.2 oz (56.79 kg)  BMI 22.18 kg/m2  SpO2 98%        Objective:   Physical Exam  Physical Exam  Constitutional: She is oriented to person, place, and time. She appears well-developed and well-nourished. No distress.  HENT:  Head:  Normocephalic and atraumatic.  Right Ear: Tympanic membrane and ear canal normal.  Left Ear: Tympanic membrane and ear canal normal.  Mouth/Throat: Oropharynx is clear and moist.  Eyes: Pupils are equal, round, and reactive to light. No scleral icterus.  Neck: Normal range of motion. No thyromegaly present.  Cardiovascular: Normal rate and regular rhythm.   No murmur heard. Pulmonary/Chest: Effort normal and breath sounds normal. No respiratory distress. He has no wheezes. She has no rales. She exhibits no tenderness.  Abdominal: Soft. Bowel sounds are normal. He exhibits no distension and no mass. There is no tenderness. There is no rebound and no guarding.  Musculoskeletal: She exhibits no edema.  Lymphadenopathy:    She has no cervical adenopathy.  Neurological: She is alert and oriented to person, place, and time. She has normal patellar reflexes. She exhibits normal muscle tone. Coordination normal.  Skin: Skin is warm and dry.  Psychiatric: She has a normal mood and affect. Her behavior is normal. Judgment and thought content normal.  Breasts: Examined lying Right: Without masses, retractions, discharge or axillary adenopathy.  Left: Without masses, retractions, discharge or axillary adenopathy.  Inguinal/mons: Normal without inguinal adenopathy  External genitalia: Normal  BUS/Urethra/Skene's glands: Normal  Bladder: Normal  Vagina: Normal  Cervix: Normal  Uterus: normal in size, shape and contour. Midline and mobile  Adnexa/parametria:  Rt: Without masses or tenderness.  Lt: Without masses or tenderness.  Anus and perineum: Normal           Assessment & Plan:  Preventative: discussed healthy diet, smoking cessation (she is not motivated to quit).   She will check with insurance re:  Coverage for zostavax. Refer for colonoscopy. Pap performed today.   EKG tracing is personally reviewed.  EKG notes NSR.  No acute changes.       Assessment & Plan:

## 2015-06-17 NOTE — Patient Instructions (Addendum)
Please complete lab work prior to leaving. Try to add more fresh fruits/veggies to your diet. Continue regular walking. Check with your insurance re: coverage for shingles vaccine (zostavax) then book a nurse visit at your convenience.

## 2015-06-17 NOTE — Addendum Note (Signed)
Addended by: Harl Bowie on: 06/17/2015 08:47 AM   Modules accepted: Orders

## 2015-06-17 NOTE — Addendum Note (Signed)
Addended by: Kelle Darting A on: 06/17/2015 11:34 AM   Modules accepted: Orders

## 2015-06-18 LAB — CYTOLOGY - PAP

## 2015-06-19 ENCOUNTER — Telehealth: Payer: Self-pay | Admitting: Family

## 2015-06-19 DIAGNOSIS — R319 Hematuria, unspecified: Secondary | ICD-10-CM

## 2015-06-19 NOTE — Telephone Encounter (Signed)
Could you please let pt know that her labs look good, except urine again shows blood.  I know she had urologic eval.  I would like to request copy of her urology records please.

## 2015-06-20 NOTE — Telephone Encounter (Signed)
Left a message for call back.  

## 2015-06-21 NOTE — Telephone Encounter (Signed)
[facility], [phone] and requested records. Pt last seen by them on 09/15/12 and that note is scanned in to pt's record. Please advise?

## 2015-06-22 NOTE — Telephone Encounter (Signed)
Note reviewed.

## 2015-06-26 ENCOUNTER — Encounter: Payer: 59 | Admitting: Family

## 2015-07-21 ENCOUNTER — Encounter: Payer: Self-pay | Admitting: Family

## 2015-07-22 MED ORDER — TEMAZEPAM 15 MG PO CAPS: ORAL_CAPSULE | ORAL | Status: DC

## 2015-07-22 NOTE — Telephone Encounter (Signed)
Last temazepam Rx 06/17/15, #30. Pt signed [place] on 05/06/15 but it appears we have never collected a UDS. Will ask pt to provide UDS when she picks up Rx. Rx printed and forwarded to PCP for signature.

## 2015-07-22 NOTE — Telephone Encounter (Signed)
Rx placed at front desk for pick up and pt has been made aware to provide UDS when she picks up Rx.

## 2015-08-07 ENCOUNTER — Ambulatory Visit (AMBULATORY_SURGERY_CENTER): Payer: Self-pay

## 2015-08-07 VITALS — Ht 63.0 in | Wt 122.2 lb

## 2015-08-07 DIAGNOSIS — Z1211 Encounter for screening for malignant neoplasm of colon: Secondary | ICD-10-CM

## 2015-08-07 NOTE — Progress Notes (Signed)
No allergies to eggs or soy No diet/weight loss meds No past problems with anesthesia No home oxygen  Has email and internet; refused emmi

## 2015-08-19 ENCOUNTER — Encounter: Payer: Self-pay | Admitting: Family

## 2015-08-19 MED ORDER — TEMAZEPAM 15 MG PO CAPS: ORAL_CAPSULE | ORAL | Status: DC

## 2015-08-19 NOTE — Telephone Encounter (Signed)
Last temazepam Rx printed 07/22/15. Pt up to date with [facility] and UDS. Rx printed an forwarded to PCP for signature.

## 2015-08-21 ENCOUNTER — Encounter: Payer: 59 | Admitting: Gastroenterology

## 2015-08-22 ENCOUNTER — Ambulatory Visit (INDEPENDENT_AMBULATORY_CARE_PROVIDER_SITE_OTHER): Payer: 59

## 2015-08-22 DIAGNOSIS — Z23 Encounter for immunization: Secondary | ICD-10-CM

## 2015-08-22 MED ORDER — ZOSTER VACCINE LIVE 19400 UNT/0.65ML ~~LOC~~ SOLR: 0.6500 mL | Freq: Once | SUBCUTANEOUS | Status: DC

## 2015-09-19 ENCOUNTER — Encounter: Payer: Self-pay | Admitting: Family

## 2015-09-19 MED ORDER — TEMAZEPAM 15 MG PO CAPS: ORAL_CAPSULE | ORAL | Status: DC

## 2015-09-19 MED FILL — TEMAZEPAM 15 MG CAPSULE: 15 | 30 days supply | Qty: 30 | Fill #0

## 2015-09-19 NOTE — Telephone Encounter (Signed)
Melissa, please advise temazepam refill.   Last OV: 06/17/2015 Last filled: 08/19/2015 #30, 0 RF UDS: 07/21/2015

## 2015-09-27 ENCOUNTER — Ambulatory Visit (AMBULATORY_SURGERY_CENTER): Payer: 59 | Admitting: Gastroenterology

## 2015-09-27 ENCOUNTER — Encounter: Payer: Self-pay | Admitting: Gastroenterology

## 2015-09-27 VITALS — BP 114/73 | HR 64 | Temp 97.6°F | Resp 15 | Ht 63.0 in | Wt 122.0 lb

## 2015-09-27 DIAGNOSIS — Z1211 Encounter for screening for malignant neoplasm of colon: Secondary | ICD-10-CM

## 2015-09-27 DIAGNOSIS — D123 Benign neoplasm of transverse colon: Secondary | ICD-10-CM

## 2015-09-27 DIAGNOSIS — D125 Benign neoplasm of sigmoid colon: Secondary | ICD-10-CM | POA: Diagnosis not present

## 2015-09-27 DIAGNOSIS — D124 Benign neoplasm of descending colon: Secondary | ICD-10-CM

## 2015-09-27 DIAGNOSIS — K635 Polyp of colon: Secondary | ICD-10-CM | POA: Diagnosis not present

## 2015-09-27 DIAGNOSIS — J449 Chronic obstructive pulmonary disease, unspecified: Secondary | ICD-10-CM | POA: Diagnosis not present

## 2015-09-27 HISTORY — PX: COLONOSCOPY: SHX174

## 2015-09-27 MED ORDER — SODIUM CHLORIDE 0.9 % IV SOLN: 500.0000 mL | INTRAVENOUS | Status: DC

## 2015-09-27 NOTE — Patient Instructions (Addendum)
YOU HAD AN ENDOSCOPIC PROCEDURE TODAY AT [facility]:   Refer to the procedure report that was given to you for any specific questions about what was found during the examination.  If the procedure report does not answer your questions, please call your gastroenterologist to clarify.  If you requested that your care partner not be given the details of your procedure findings, then the procedure report has been included in a sealed envelope for you to review at your convenience later.  YOU SHOULD EXPECT: Some feelings of bloating in the abdomen. Passage of more gas than usual.  Walking can help get rid of the air that was put into your GI tract during the procedure and reduce the bloating. If you had a lower endoscopy (such as a colonoscopy or flexible sigmoidoscopy) you may notice spotting of blood in your stool or on the toilet paper. If you underwent a bowel prep for your procedure, you may not have a normal bowel movement for a few days.  Please Note:  You might notice some irritation and congestion in your nose or some drainage.  This is from the oxygen used during your procedure.  There is no need for concern and it should clear up in a day or so.  SYMPTOMS TO REPORT IMMEDIATELY:   Following lower endoscopy (colonoscopy or flexible sigmoidoscopy):  Excessive amounts of blood in the stool  Significant tenderness or worsening of abdominal pains  Swelling of the abdomen that is new, acute  Fever of 100F or higher    For urgent or emergent issues, a gastroenterologist can be reached at any hour by calling [phone].   DIET: Your first meal following the procedure should be a small meal and then it is ok to progress to your normal diet. Heavy or fried foods are harder to digest and may make you feel nauseous or bloated.  Likewise, meals heavy in dairy and vegetables can increase bloating.  Drink plenty of fluids but you should avoid alcoholic beverages for 24  hours.  ACTIVITY:  You should plan to take it easy for the rest of today and you should NOT DRIVE or use heavy machinery until tomorrow (because of the sedation medicines used during the test).    FOLLOW UP: Our staff will call the number listed on your records the next business day following your procedure to check on you and address any questions or concerns that you may have regarding the information given to you following your procedure. If we do not reach you, we will leave a message.  However, if you are feeling well and you are not experiencing any problems, there is no need to return our call.  We will assume that you have returned to your regular daily activities without incident.  If any biopsies were taken you will be contacted by phone or by letter within the next 1-3 weeks.  Please call us at [phone] if you have not heard about the biopsies in 3 weeks.    SIGNATURES/CONFIDENTIALITY: You and/or your care partner have signed paperwork which will be entered into your electronic medical record.  These signatures attest to the fact that that the information above on your After Visit Summary has been reviewed and is understood.  Full responsibility of the confidentiality of this discharge information lies with you and/or your care-partner.   Information on polyps given to you today  No aspirin or anti inflammatory products for 2 weeks

## 2015-09-27 NOTE — Op Note (Signed)
[place]  [address] [place], [id number]   COLONOSCOPY PROCEDURE REPORT  PATIENT: Taylor, Atkins  MR#: [id number] BIRTHDATE: 09-18-1955 , 59  yrs. old GENDER: female ENDOSCOPIST: Harl Bowie, MD REFERRED BY:O'Sullivan, Melissa NP PROCEDURE DATE:  09/27/2015 PROCEDURE:   Colonoscopy, screening, Colonoscopy with cold biopsy polypectomy, and Colonoscopy with snare polypectomy First Screening Colonoscopy - Avg.  risk and is 50 yrs.  old or older - No.  Prior Negative Screening - Now for repeat screening. 10 or more years since last screening  History of Adenoma - Now for follow-up colonoscopy & has been > or = to 3 yrs.  N/A  Polyps removed today? Yes ASA CLASS:   Class II INDICATIONS:Screening for colonic neoplasia and Colorectal Neoplasm Risk Assessment for this procedure is average risk. MEDICATIONS: Propofol 250 mg IV  DESCRIPTION OF PROCEDURE:   After the risks benefits and alternatives of the procedure were thoroughly explained, informed consent was obtained.  The digital rectal exam revealed no abnormalities of the rectum.   The LB [id number]  endoscope was introduced through the anus and advanced to the cecum, which was identified by both the appendix and ileocecal valve. No adverse events experienced.   The quality of the prep was good.  The instrument was then slowly withdrawn as the colon was fully examined. Estimated blood loss is zero unless otherwise noted in this procedure report.   COLON FINDINGS: Two flat polyps ranging between 5-24mm in size were found in the sigmoid colon and at the hepatic flexure.  A polypectomy was performed with a cold snare and and hot snare respectively. 2 sessile polyps in descending colon 3-4 mm in size removed by cold forceps  The resection was complete, the polyp tissue was completely retrieved and sent to histology.  Retroflexed views revealed internal hemorrhoids. The time to cecum =  3.9 Withdrawal time = 13.2   The scope was withdrawn and the procedure completed. COMPLICATIONS: There were no immediate complications.  ENDOSCOPIC IMPRESSION: Two flat polyps ranging between 5-62mm in size were found in the sigmoid colon and at the hepatic flexure; polypectomy was performed with a cold snare  RECOMMENDATIONS: 1.  If the polyp(s) removed today are proven to be adenomatous (pre-cancerous) polyps, you will need a repeat colonoscopy in 5 years.  Otherwise you should continue to follow colorectal cancer screening guidelines for "routine risk" patients with colonoscopy in 10 years.  You will receive a letter within 1-2 weeks with the results of your biopsy as well as final recommendations.  Please call my office if you have not received a letter after 3 weeks. 2.  Avoid all NSAIDS for the next 2 weeks. 3.  Await pathology results  eSigned:  Harl Bowie, MD 09/27/2015 8:40 AM

## 2015-09-27 NOTE — Progress Notes (Signed)
To recovery, report to [place], RN, VSS 

## 2015-09-27 NOTE — Progress Notes (Signed)
Called to room to assist during endoscopic procedure.  Patient ID and intended procedure confirmed with present staff. Received instructions for my participation in the procedure from the performing physician.  

## 2015-09-30 ENCOUNTER — Telehealth: Payer: Self-pay | Admitting: Emergency Medicine

## 2015-09-30 NOTE — Telephone Encounter (Signed)
  Follow up Call-  Call back number 09/27/2015  Post procedure Call Back phone  # [phone]  Permission to leave phone message Yes     Patient questions:  Do you have a fever, pain , or abdominal swelling? No. Pain Score  0 *  Have you tolerated food without any problems? Yes.    Have you been able to return to your normal activities? Yes.    Do you have any questions about your discharge instructions: Diet   No. Medications  No. Follow up visit  No.  Do you have questions or concerns about your Care? No.  Actions: * If pain score is 4 or above: No action needed, pain <4.

## 2015-10-06 IMAGING — US US SOFT TISSUE HEAD/NECK
1 series · 14 of 25 positions shown · non-contrast
Comparison: 12/28/2013

CLINICAL DATA: Multinodular goiter.

EXAM:
THYROID ULTRASOUND
TECHNIQUE: Ultrasound examination of the thyroid gland and adjacent soft
tissues was performed.

[Series 1: us soft tissue head/neck · 0.05mm/px · 14 of 61 slices shown]
[im 1/61]
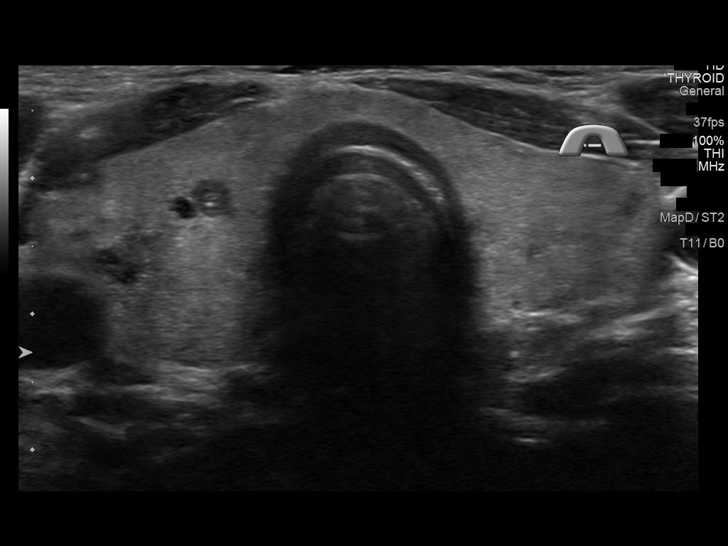
[im 6/61]
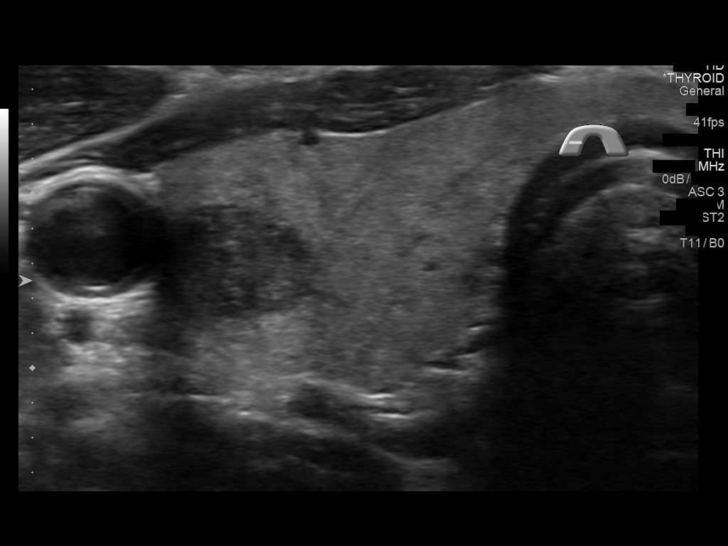
[im 11/61]
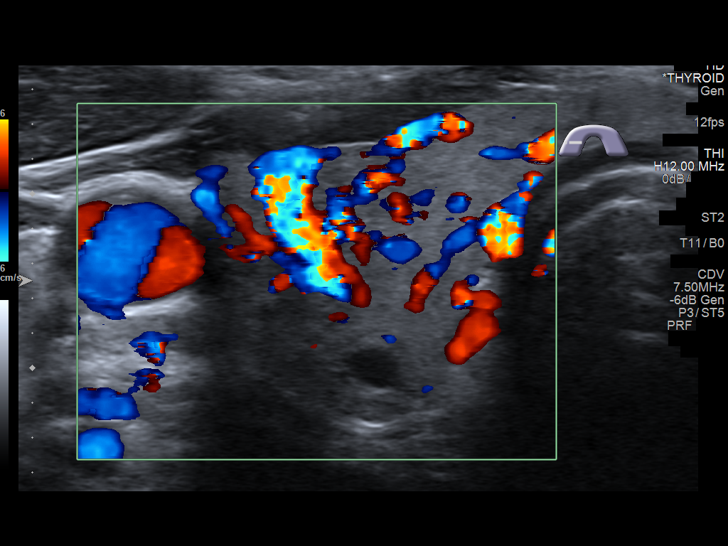
[im 16/61]
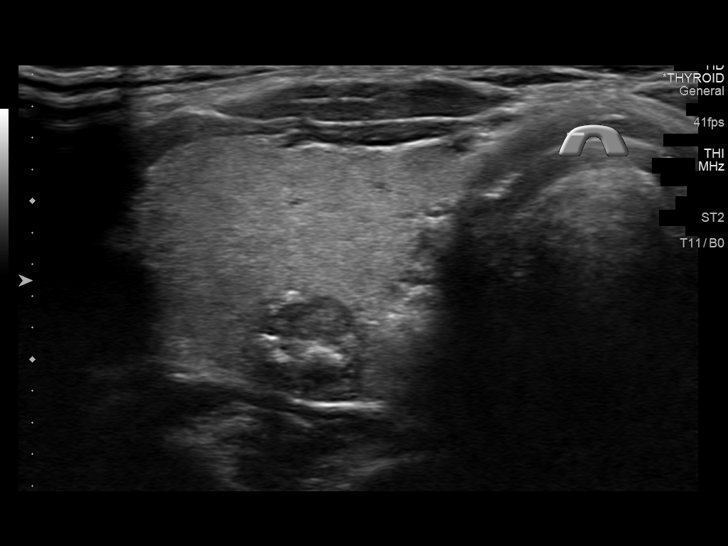
[im 21/61]
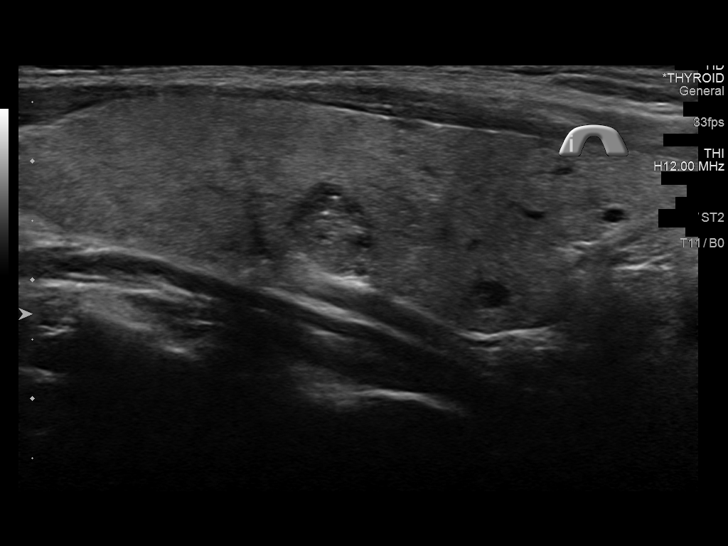
[im 23/61]
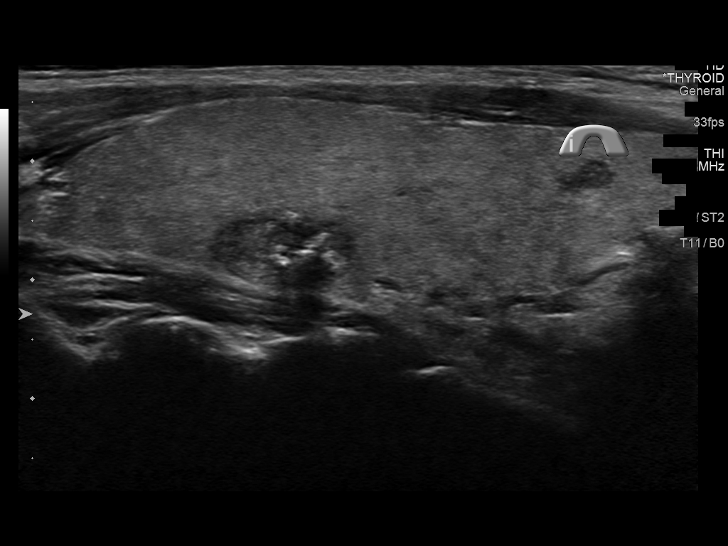
[im 28/61]
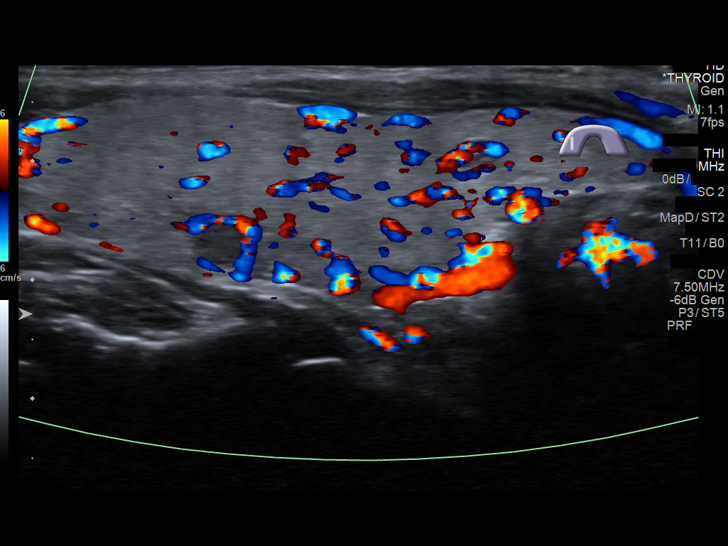
[im 33/61]
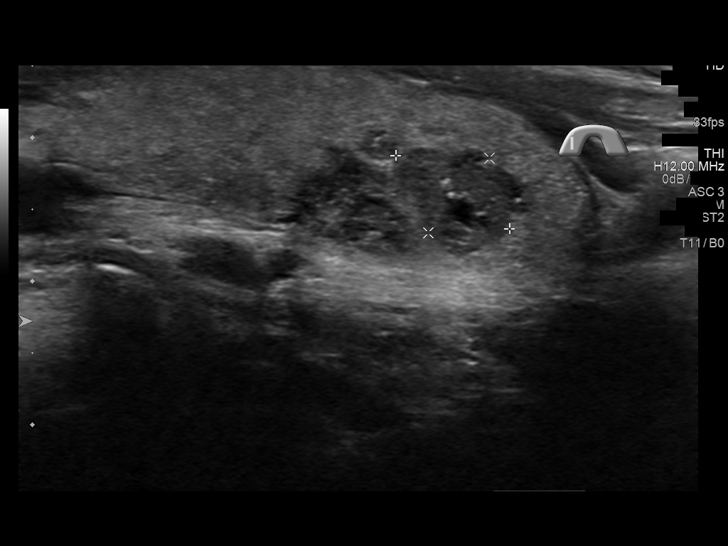
[im 38/61]
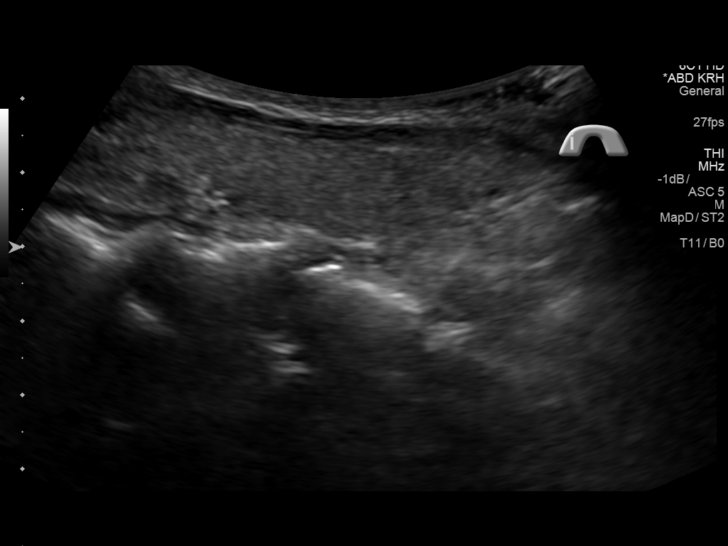
[im 41/61]
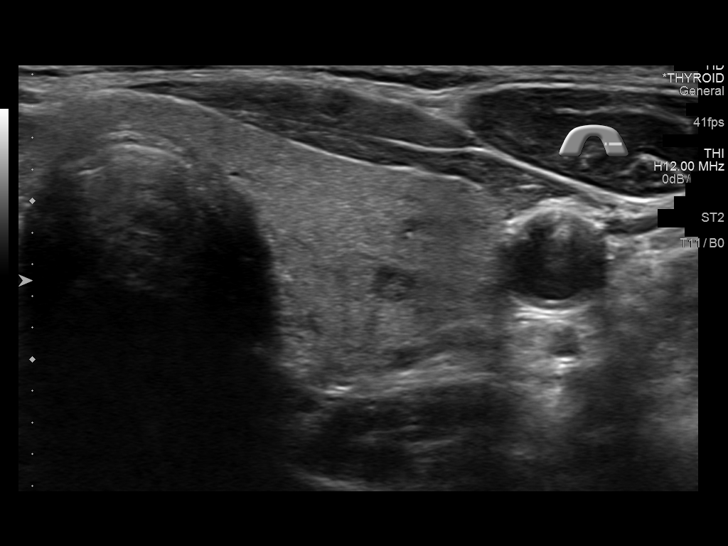
[im 46/61]
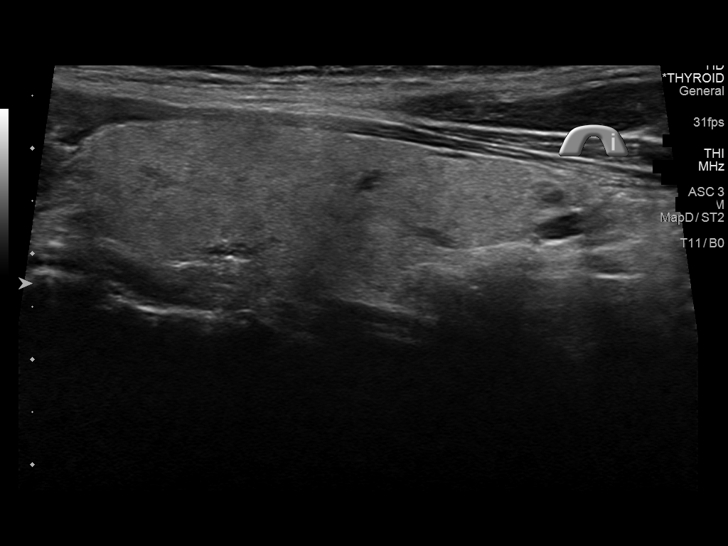
[im 51/61]
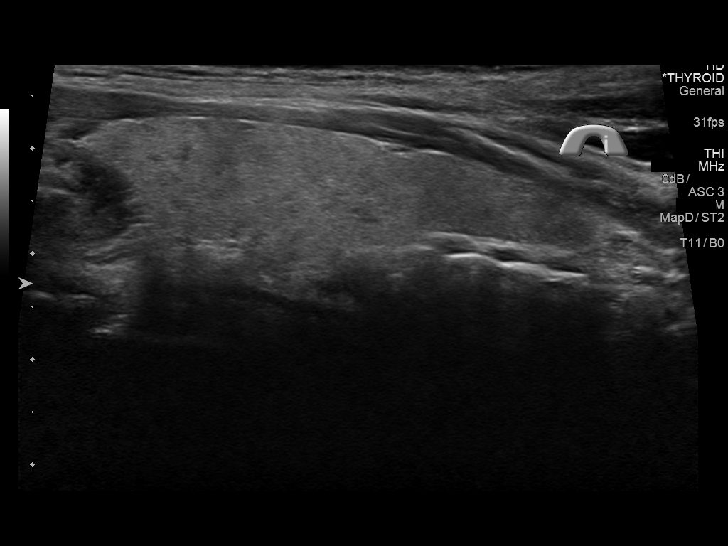
[im 56/61]
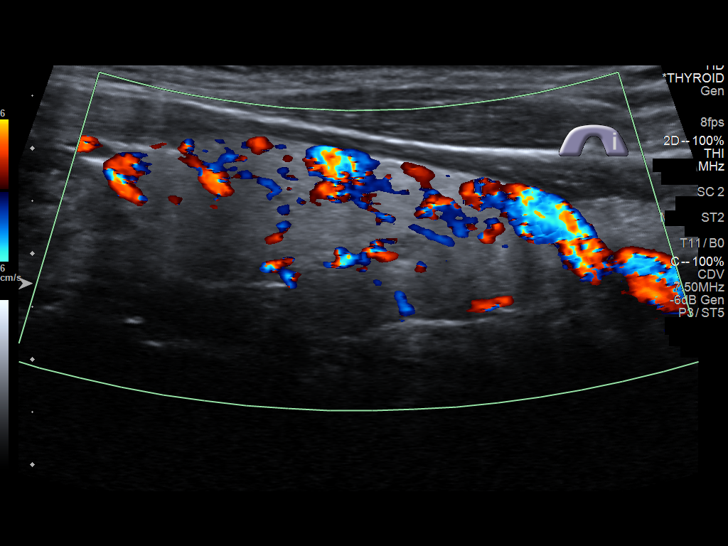
[im 61/61]
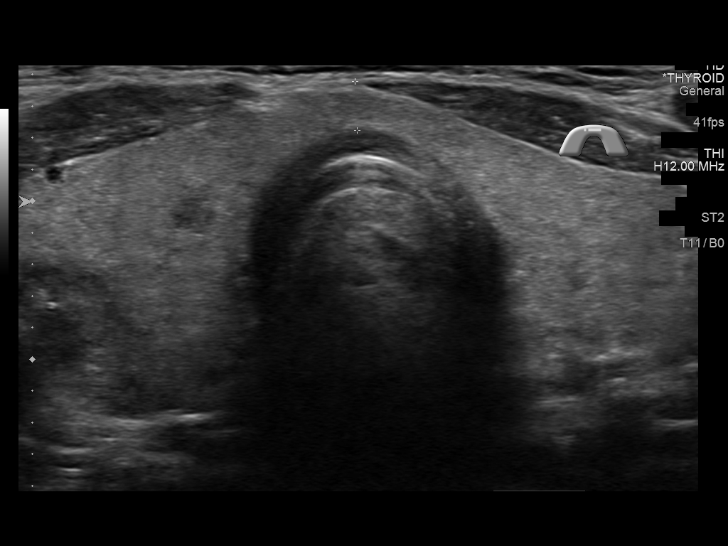

[14 of 25 positions shown; findings below may reference images not displayed]

FINDINGS: Right thyroid lobe

Measurements: 5.7 x 1.6 x 2.1 cm. Multiple nodules in the right
thyroid lobe. Nodule in the mid/lower right thyroid lobe measures
0.9 x 0.7 x 0.9 cm. Nodule in the mid lateral thyroid lobe is
slightly heterogeneous and measures up to 0.9 cm. There is a
heterogeneous nodule in the posterior right thyroid lobe with
probable dystrophic calcifications. This nodule measures 1.2 x 0.7 x
0.8 cm and previously measured 1.3 x 0.6 x 0.7 cm.

Left thyroid lobe

Measurements: 5.7 x 1.8 x 1.6 cm. One or two small nodules in the
left thyroid lobe.

Isthmus

Thickness: 0.3 cm.  No nodules visualized.

Lymphadenopathy

None visualized.
IMPRESSION: Thyroid nodules, predominantly in the right thyroid lobe. The
dominant right thyroid nodule is stable.

## 2015-10-07 ENCOUNTER — Telehealth: Payer: Self-pay | Admitting: Family

## 2015-10-07 NOTE — Telephone Encounter (Signed)
Left message for patient to call about Flu Shot

## 2015-10-09 ENCOUNTER — Encounter: Payer: Self-pay | Admitting: Gastroenterology

## 2015-10-11 MED FILL — CLINDAMYCIN PH 1% SOLUTION: 1 | 30 days supply | Qty: 60 | Fill #0

## 2015-10-18 ENCOUNTER — Encounter: Payer: Self-pay | Admitting: Family

## 2015-10-21 MED ORDER — TEMAZEPAM 15 MG PO CAPS: ORAL_CAPSULE | ORAL | Status: DC

## 2015-10-21 MED FILL — TEMAZEPAM 15 MG CAPSULE: 15 | 30 days supply | Qty: 30 | Fill #0

## 2015-10-21 NOTE — Telephone Encounter (Signed)
Last temazepam Rx 09/19/15.  Next UDS due 01/2016.  Rx printed and forwarded to PCP for signature.

## 2015-11-19 ENCOUNTER — Encounter: Payer: Self-pay | Admitting: Family

## 2015-11-20 MED ORDER — TEMAZEPAM 15 MG PO CAPS: ORAL_CAPSULE | ORAL | Status: DC

## 2015-11-20 NOTE — Telephone Encounter (Signed)
Rx printed as requested. Placed in provider's red folder for signature.

## 2015-11-20 NOTE — Telephone Encounter (Signed)
OK to send #30 tabs please.

## 2015-11-20 NOTE — Telephone Encounter (Signed)
Temazepam   Last seen 06/17/15 and filled 10/21/15 #30 UDS 07/21/15 moderate risk  Please advise     KP

## 2015-11-20 NOTE — Telephone Encounter (Signed)
Immunization record updated per [employer].

## 2015-11-21 MED ORDER — TEMAZEPAM 15 MG PO CAPS: ORAL_CAPSULE | ORAL | Status: DC

## 2015-11-21 MED FILL — TEMAZEPAM 15 MG CAPSULE: 15 | 30 days supply | Qty: 30 | Fill #0

## 2015-11-21 NOTE — Telephone Encounter (Signed)
Medication phoned to pharmacy as requested.  

## 2015-11-21 NOTE — Addendum Note (Signed)
Addended by: Dorrene German on: 11/21/2015 08:27 AM   Modules accepted: Orders

## 2015-12-23 ENCOUNTER — Encounter: Payer: Self-pay | Admitting: Family

## 2015-12-24 MED ORDER — TEMAZEPAM 15 MG PO CAPS: ORAL_CAPSULE | ORAL | Status: DC

## 2015-12-24 NOTE — Telephone Encounter (Signed)
Rx faxed to pharmacy  

## 2015-12-24 NOTE — Telephone Encounter (Signed)
Med:  Temazepam Last OV:  06/17/15 Next OV:  None scheduled Last UDS: 07/21/15 Next UDS:  Due 01/18/16 Last RF:  11/21/15, #30  Rx printed and forwarded to PCP for signature.  Please advise when pt should follow up in the office?

## 2015-12-25 MED FILL — TEMAZEPAM 15 MG CAPSULE: 15 | 30 days supply | Qty: 30 | Fill #0

## 2015-12-25 NOTE — Telephone Encounter (Signed)
Changed pharmacy in the system. Pt will prefer a 90 day supply when future refills will be given.

## 2016-01-19 ENCOUNTER — Telehealth: Payer: Self-pay | Admitting: Family

## 2016-01-20 MED ORDER — TEMAZEPAM 15 MG PO CAPS: ORAL_CAPSULE | ORAL | Status: DC

## 2016-01-20 NOTE — Telephone Encounter (Signed)
Due for follow up please

## 2016-01-20 NOTE — Telephone Encounter (Signed)
Pt last seen 06/17/15 for CPE and has no future appts scheduled. When would you like to see pt back in the office?   Last UDS: 07/2015 and due now for UDS  Rx printed and forwarded to PCP for signature.

## 2016-01-20 NOTE — Telephone Encounter (Signed)
Rx faxed to pharmacy, please advise?

## 2016-01-22 MED FILL — TEMAZEPAM 15 MG CAPSULE: 15 | 30 days supply | Qty: 30 | Fill #0

## 2016-02-12 ENCOUNTER — Encounter: Payer: Self-pay | Admitting: Family

## 2016-02-12 ENCOUNTER — Ambulatory Visit (INDEPENDENT_AMBULATORY_CARE_PROVIDER_SITE_OTHER): Payer: 59 | Admitting: Family

## 2016-02-12 VITALS — BP 98/64 | HR 78 | Temp 98.2°F | Resp 18 | Ht 63.0 in | Wt 113.0 lb

## 2016-02-12 DIAGNOSIS — J440 Chronic obstructive pulmonary disease with acute lower respiratory infection: Secondary | ICD-10-CM | POA: Diagnosis not present

## 2016-02-12 DIAGNOSIS — L989 Disorder of the skin and subcutaneous tissue, unspecified: Secondary | ICD-10-CM

## 2016-02-12 DIAGNOSIS — F419 Anxiety disorder, unspecified: Secondary | ICD-10-CM

## 2016-02-12 DIAGNOSIS — F17200 Nicotine dependence, unspecified, uncomplicated: Secondary | ICD-10-CM

## 2016-02-12 DIAGNOSIS — G4700 Insomnia, unspecified: Secondary | ICD-10-CM | POA: Diagnosis not present

## 2016-02-12 MED ORDER — FLUTICASONE-SALMETEROL 100-50 MCG/DOSE IN AEPB: 1.0000 | INHALATION_SPRAY | Freq: Two times a day (BID) | RESPIRATORY_TRACT | Status: DC

## 2016-02-12 MED ORDER — VARENICLINE TARTRATE 0.5 MG X 11 & 1 MG X 42 PO MISC: ORAL | Status: DC

## 2016-02-12 MED FILL — ADVAIR 100/50 DISKUS: 100-50 | 30 days supply | Qty: 60 | Fill #0

## 2016-02-12 NOTE — Progress Notes (Signed)
Subjective:    Patient ID: Taylor Atkins, female    DOB: 21-Apr-1956, 60 y.o.   MRN: [id number]  HPI  Taylor Atkins is a 60 yr old female who presents today for follow up.  1) anxiety- denies current anxiety symptoms.   2) Insomnia- using restoril QHS.reports that she sleeps well as long as she gets off the temazepam.    3) COPD- no longer using qvar.  Using albuterol once a day.   4) Tobacco abuse- she smokes < 1ppd (10-15 cig to day).  Quit while pregnant only.  Review of Systems    see HPI  Past Medical History  Diagnosis Date  . Anxiety   . Osteopenia   . Thyroid disease   . Goiter   . Tobacco use disorder   . Acne   . Skin cancer 6/15     Social History   Social History  . Marital Status: Married    Spouse Name: N/A  . Number of Children: N/A  . Years of Education: N/A   Occupational History  . Not on file.   Social History Main Topics  . Smoking status: Current Every Day Smoker -- 1.00 packs/day for 40 years    Types: Cigarettes  . Smokeless tobacco: Never Used  . Alcohol Use: 0.0 oz/week    0 Standard drinks or equivalent per week     Comment: seldom  . Drug Use: No  . Sexual Activity: Yes   Other Topics Concern  . 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 Bunionectomy  1978  . Tubal ligation  1993  . Hemorrhoid surgery  1994  . Cesarean section  1990, 1993    Family History  Problem Relation Age of Onset  . Hypertension Mother     Afib, living  . Heart disease Father     Afib, pacemaker, PVD  . Hypertension Father   . Diabetes Brother   . Hypertension Brother   . Diabetes Brother   . Hypertension Brother   . Colon cancer Neg Hx     Allergies  Allergen Reactions  . Fosamax [Alendronate  Sodium] Other (See Comments)    Extreme nausea    Current Outpatient Prescriptions on File Prior to Visit  Medication Sig Dispense Refill  . albuterol (PROVENTIL HFA;VENTOLIN HFA) 108 (90 BASE) MCG/ACT inhaler Inhale 2 inhalations into lungs q8h prn wheezing 1 Inhaler 2  . Calcium Carbonate-Vitamin D 600-400 MG-UNIT per chew tablet Chew 1 tablet by mouth 2 (two) times daily.    Mariane Baumgarten Calcium (STOOL SOFTENER PO) Take 1 tablet by mouth at bedtime. Reported on 09/27/2015    . meclizine (ANTIVERT) 25 MG tablet Take one tablet every 8 hours as needed for dizziness 30 tablet 0  . temazepam (RESTORIL) 15 MG capsule TAKE 1 CAPSULE BY MOUTH AT BEDTIME AS NEEDED FOR SLEEP 30 capsule 0   No current facility-administered medications on file prior to visit.    BP 98/64 mmHg  Pulse 78  Temp(Src) 98.2 F (36.8 C) (Oral)  Resp 18  Ht 5\' 3"  (1.6 m)  Wt 113 lb (51.256 kg)  BMI 20.02 kg/m2    Objective:   Physical  Exam  Constitutional: She appears well-developed and well-nourished.  HENT:  Head: Normocephalic and atraumatic.  Cardiovascular: Normal rate, regular rhythm and normal heart sounds.   No murmur heard. Pulmonary/Chest: Effort normal and breath sounds normal. No respiratory distress. She has no wheezes.  Musculoskeletal: She exhibits no edema.  Psychiatric: She has a normal mood and affect. Her behavior is normal. Judgment and thought content normal.  skin: raised skin lesion left shin         Assessment & Plan:  Skin lesion- pt will schedule biopsy appointment.

## 2016-02-12 NOTE — Assessment & Plan Note (Signed)
Pt was counseled on smoking cessation today for >5 minutes.  She was counseled on various methods to help quit smoking including the patch, zyban and chantix.  She wishes to try chantix. Counseled on the side effects of Chantix including nausea, dizziness, bad dreams and rarely suicide ideation.  She knows to call if she has any mood changes after beginning chantix. She is aware and wishes to proceed.

## 2016-02-12 NOTE — Assessment & Plan Note (Signed)
Stable.  Monitor.  

## 2016-02-12 NOTE — Patient Instructions (Addendum)
Start advair for your breathing.  You may continue to use albuterol as needed.   Begin chantix. You can smoke x 1 week after starting. Call if you have any concerns after beginning medication.  Let me know how you are doing in about 3 weeks and I will send your additional refills.   Please schedule a mole removal appointment at the front desk.

## 2016-02-12 NOTE — Progress Notes (Signed)
Pre visit review using our clinic review tool, if applicable. No additional management support is needed unless otherwise documented below in the visit note. 

## 2016-02-12 NOTE — Assessment & Plan Note (Signed)
Stable on restoril. 

## 2016-02-12 NOTE — Assessment & Plan Note (Signed)
Daily symptoms, trial of advair.

## 2016-02-14 ENCOUNTER — Telehealth: Payer: Self-pay | Admitting: *Deleted

## 2016-02-14 NOTE — Telephone Encounter (Signed)
PA approved for coverage until 02/13/17. File ID: [id number]. Approval letter sent for scanning. JG//CMA

## 2016-02-14 NOTE — Telephone Encounter (Signed)
PA initiated on [email or web address], awaiting determination. JG//CMA 

## 2016-02-17 ENCOUNTER — Encounter: Payer: Self-pay | Admitting: Family

## 2016-02-17 MED ORDER — TEMAZEPAM 15 MG PO CAPS: ORAL_CAPSULE | ORAL | Status: DC

## 2016-02-17 MED FILL — TEMAZEPAM 15 MG CAPSULE: 15 | 30 days supply | Qty: 30 | Fill #0

## 2016-02-17 MED FILL — CHANTIX STARTING MONTH BOX: 0.5 MG X 11 | 28 days supply | Qty: 53 | Fill #0

## 2016-02-17 NOTE — Telephone Encounter (Signed)
Last temazepam Rx: 01/20/16, #30 Last OV; 02/12/16 Next OV: 05/15/16 UDS: 07/31/15, next due 01/18/16  Will obtain UDS at next OV 05/2016. Rx printed and forwarded to covering Provider for signature.

## 2016-02-17 NOTE — Telephone Encounter (Signed)
Rx faxed to pharmacy  

## 2016-02-18 MED FILL — DOXYCYCLINE HYC 20 MG TAB: 20 | 30 days supply | Qty: 30 | Fill #0

## 2016-02-24 ENCOUNTER — Ambulatory Visit: Payer: 59 | Admitting: Family

## 2016-03-11 ENCOUNTER — Encounter: Payer: Self-pay | Admitting: Family

## 2016-03-11 MED ORDER — VARENICLINE TARTRATE 1 MG PO TABS: 1.0000 mg | ORAL_TABLET | Freq: Two times a day (BID) | ORAL | Status: DC

## 2016-03-11 MED FILL — CHANTIX 1 MG TABLET: 1 | 28 days supply | Qty: 56 | Fill #0

## 2016-03-18 ENCOUNTER — Encounter: Payer: Self-pay | Admitting: Family

## 2016-03-18 NOTE — Telephone Encounter (Signed)
Requesting Temazepam 15mg -Take 1 capsule by mouth at bedtime as needed for sleep. Last refill:02/17/16;#30,0 Last OV:02/12/16 Please advise.//AB/CMA

## 2016-03-19 MED ORDER — TEMAZEPAM 15 MG PO CAPS: ORAL_CAPSULE | ORAL | Status: DC

## 2016-03-19 NOTE — Telephone Encounter (Signed)
See rx. 

## 2016-03-23 ENCOUNTER — Encounter: Payer: Self-pay | Admitting: Family

## 2016-03-23 ENCOUNTER — Other Ambulatory Visit: Payer: Self-pay | Admitting: Family

## 2016-03-23 MED FILL — TEMAZEPAM 15 MG CAPSULE: 15 | 30 days supply | Qty: 30 | Fill #0

## 2016-03-23 NOTE — Telephone Encounter (Signed)
Temazepam fax failed on Friday. Refaxed to [facility]. Message sent to pt.

## 2016-03-23 NOTE — Telephone Encounter (Signed)
Response was sent to pt in 03/23/16 Rx request email.

## 2016-04-13 ENCOUNTER — Other Ambulatory Visit: Payer: Self-pay | Admitting: Family

## 2016-04-14 MED ORDER — TEMAZEPAM 15 MG PO CAPS: ORAL_CAPSULE | ORAL | 0 refills | Status: DC

## 2016-04-14 NOTE — Telephone Encounter (Signed)
Last temazepam Rx: 03/19/16 UDS:  07/2015 Next OV: 05/15/16  Rx printed and forwarded to PCP for signature.

## 2016-04-14 NOTE — Telephone Encounter (Signed)
Rx faxed to pharmacy  

## 2016-04-15 MED FILL — CHANTIX 1 MG TABLET: 1 | 28 days supply | Qty: 56 | Fill #1

## 2016-04-23 MED FILL — TEMAZEPAM 15 MG CAPSULE: 15 | 30 days supply | Qty: 30 | Fill #0

## 2016-05-15 ENCOUNTER — Encounter: Payer: Self-pay | Admitting: Family

## 2016-05-15 ENCOUNTER — Ambulatory Visit (INDEPENDENT_AMBULATORY_CARE_PROVIDER_SITE_OTHER): Payer: 59 | Admitting: Family

## 2016-05-15 DIAGNOSIS — F419 Anxiety disorder, unspecified: Secondary | ICD-10-CM | POA: Diagnosis not present

## 2016-05-15 DIAGNOSIS — F17200 Nicotine dependence, unspecified, uncomplicated: Secondary | ICD-10-CM

## 2016-05-15 MED ORDER — ESCITALOPRAM OXALATE 10 MG PO TABS: ORAL_TABLET | ORAL | 0 refills | Status: DC

## 2016-05-15 MED FILL — ESCITALOPRAM 10 MG TABLET: 10 | 30 days supply | Qty: 30 | Fill #0

## 2016-05-15 NOTE — Progress Notes (Signed)
Pre visit review using our clinic review tool, if applicable. No additional management support is needed unless otherwise documented below in the visit note. 

## 2016-05-15 NOTE — Patient Instructions (Signed)
Stop chantix. Start lexapro 10mg , 1/2 tab once daily for 1 week, then increase to a full tab once daily on week two.

## 2016-05-15 NOTE — Progress Notes (Signed)
Subjective:    Patient ID: Taylor Atkins, female    DOB: June 29, 1956, 60 y.o.   MRN: [id number]  HPI  [name] is a 60 yr old female who presents today for follow up.  Last visit she was started on chantix due to ongoing tobacco abuse.  Reports that she has quit smoking.  Notes that her nerves are worse.  She is using nicotine free vapes.  She reports that she has been on lexapro in the past which helped.  Declines flu shot.    Review of Systems    see HPI  Past Medical History:  Diagnosis Date  . Acne   . Anxiety   . Goiter   . Osteopenia   . Skin cancer 6/15  . Thyroid disease   . Tobacco use disorder      Social History   Social History  . Marital status: Married    Spouse name: N/A  . Number of children: N/A  . Years of education: N/A   Occupational History  . Not on file.   Social History Main Topics  . Smoking status: Current Every Day Smoker    Packs/day: 1.00    Years: 40.00    Types: Cigarettes  . Smokeless tobacco: Never Used  . Alcohol use 0.0 oz/week     Comment: seldom  . Drug use: No  . Sexual activity: Yes   Other Topics Concern  . 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 [place]  . [place]  . [place]  . TUBAL LIGATION  1993    Family History  Problem Relation Age of Onset  . Hypertension Mother     Afib, living  . Heart disease Father     Afib, pacemaker, PVD  . Hypertension Father   . Diabetes Brother   . Hypertension Brother   . Diabetes Brother   . Hypertension Brother   . Colon cancer Neg Hx     Allergies  Allergen Reactions  . Fosamax [Alendronate Sodium] Other (See Comments)    Extreme nausea    Current Outpatient  Prescriptions on File Prior to Visit  Medication Sig Dispense Refill  . albuterol (PROVENTIL HFA;VENTOLIN HFA) 108 (90 BASE) MCG/ACT inhaler Inhale 2 inhalations into lungs q8h prn wheezing 1 Inhaler 2  . Calcium Carbonate-Vitamin D 600-400 MG-UNIT per chew tablet Chew 1 tablet by mouth 2 (two) times daily.    Mariane Baumgarten Calcium (STOOL SOFTENER PO) Take 1 tablet by mouth at bedtime. Reported on 09/27/2015    . Fluticasone-Salmeterol (ADVAIR) 100-50 MCG/DOSE AEPB Inhale 1 puff into the lungs 2 (two) times daily. 1 each 3  . meclizine (ANTIVERT) 25 MG tablet Take one tablet every 8 hours as needed for dizziness 30 tablet 0  . temazepam (RESTORIL) 15 MG capsule TAKE 1 CAPSULE BY MOUTH AT BEDTIME AS NEEDED FOR SLEEP 30 capsule 0  . varenicline (CHANTIX CONTINUING MONTH PAK) 1 MG tablet Take 1 tablet (1 mg total) by mouth 2 (two) times daily. 60 tablet 1   No current facility-administered medications on file prior to visit.     BP 129/69 (BP Location:  Right Arm, Patient Position: Sitting, Cuff Size: Normal)   Pulse 65   Temp 97.9 F (36.6 C) (Oral)   Resp 16   Ht 5\' 3"  (1.6 m)   Wt 119 lb 3.2 oz (54.1 kg)   SpO2 100%   BMI 21.12 kg/m    Objective:   Physical Exam  Constitutional: She is oriented to person, place, and time. She appears well-developed and well-nourished.  Neurological: She is alert and oriented to person, place, and time.  Psychiatric: She has a normal mood and affect. Her behavior is normal. Judgment and thought content normal.          Assessment & Plan:  15 min spent with pt today. >50% of this time was spent counseling patient on her anxiety and treatment.

## 2016-05-17 NOTE — Assessment & Plan Note (Signed)
Commended patient on quitting. She is just about to finish her 12th week of chantix then will discontinue chantix.

## 2016-05-17 NOTE — Assessment & Plan Note (Signed)
Deteriorated. Trial of lexapro.  Advised pt to begin 1/2 tab once daily for 1 week, then increase to a full tab once daily on week two.

## 2016-05-25 ENCOUNTER — Other Ambulatory Visit: Payer: Self-pay | Admitting: Family

## 2016-05-25 MED ORDER — TEMAZEPAM 15 MG PO CAPS: ORAL_CAPSULE | ORAL | 0 refills | Status: DC

## 2016-05-25 NOTE — Telephone Encounter (Signed)
Last temazepam rf: 04/14/16 Next OV: 06/10/16 UDS: 05/15/16  Rx printed and forwarded to PCP for signature.

## 2016-05-26 MED FILL — TEMAZEPAM 15 MG CAPSULE: 15 | 30 days supply | Qty: 30 | Fill #0

## 2016-05-26 NOTE — Telephone Encounter (Signed)
Rx faxed to pharmacy  

## 2016-06-10 ENCOUNTER — Ambulatory Visit (INDEPENDENT_AMBULATORY_CARE_PROVIDER_SITE_OTHER): Payer: 59 | Admitting: Family

## 2016-06-10 ENCOUNTER — Encounter: Payer: Self-pay | Admitting: Family

## 2016-06-10 DIAGNOSIS — F419 Anxiety disorder, unspecified: Secondary | ICD-10-CM | POA: Diagnosis not present

## 2016-06-10 DIAGNOSIS — R413 Other amnesia: Secondary | ICD-10-CM

## 2016-06-10 DIAGNOSIS — Z87891 Personal history of nicotine dependence: Secondary | ICD-10-CM | POA: Diagnosis not present

## 2016-06-10 MED ORDER — ESCITALOPRAM OXALATE 20 MG PO TABS: 20.0000 mg | ORAL_TABLET | Freq: Every day | ORAL | 1 refills | Status: DC

## 2016-06-10 MED ORDER — TEMAZEPAM 15 MG PO CAPS: ORAL_CAPSULE | ORAL | 0 refills | Status: DC

## 2016-06-10 MED FILL — ESCITALOPRAM 20 MG TABLET: 20 | 30 days supply | Qty: 30 | Fill #0

## 2016-06-10 NOTE — Progress Notes (Signed)
Pre visit review using our clinic review tool, if applicable. No additional management support is needed unless otherwise documented below in the visit note. 

## 2016-06-10 NOTE — Assessment & Plan Note (Signed)
Will plan formal MMSE next visit.

## 2016-06-10 NOTE — Assessment & Plan Note (Signed)
Remains smoke free. Commended patient on quitting smoking.

## 2016-06-10 NOTE — Progress Notes (Signed)
Subjective:    Patient ID: Taylor Atkins, female    DOB: [birth year], 60 y.o.   MRN: [id number]  HPI  Taylor Atkins is a 60 yr old female who presents today for follow up of her anxiety.  Last visit she noted that her anxiety had worsened since she quit smoking.  She was started on lexapro.  Irritable in stressful situations. Not sleeping well.  She reports that she has trouble staying asleep the last 2 weeks.  She is continuing her temazepam QHS.    Tobacco abuse- she is still smoke free. Reports cravings worsened slightly the wee that she came off of chantix.    She does report some issues with memory.    Review of Systems    see HPI  Past Medical History:  Diagnosis Date  . Acne   . Anxiety   . Goiter   . Osteopenia   . Skin cancer 6/15  . Thyroid disease   . Tobacco use disorder      Social History   Social History  . Marital status: Married    Spouse name: N/A  . Number of children: N/A  . Years of education: N/A   Occupational History  . Not on file.   Social History Main Topics  . Smoking status: Former Smoker    Packs/day: 1.00    Years: 40.00    Types: Cigarettes    Quit date: 01/13/2016  . Smokeless tobacco: Never Used  . Alcohol use 0.0 oz/week     Comment: seldom  . Drug use: No  . Sexual activity: Yes   Other Topics Concern  . 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 [place]  . [place]  . [place]  . TUBAL LIGATION  1993    Family History  Problem Relation Age of Onset  . Hypertension Mother     Afib, living  . Heart disease Father     Afib, pacemaker, PVD  . Hypertension Father   . Diabetes Brother   . Hypertension Brother   . Diabetes  Brother   . Hypertension Brother   . Colon cancer Neg Hx     Allergies  Allergen Reactions  . Fosamax [Alendronate Sodium] Other (See Comments)    Extreme nausea    Current Outpatient Prescriptions on File Prior to Visit  Medication Sig Dispense Refill  . albuterol (PROVENTIL HFA;VENTOLIN HFA) 108 (90 BASE) MCG/ACT inhaler Inhale 2 inhalations into lungs q8h prn wheezing 1 Inhaler 2  . Calcium Carbonate-Vitamin D 600-400 MG-UNIT per chew tablet Chew 1 tablet by mouth 2 (two) times daily.    Mariane Baumgarten Calcium (STOOL SOFTENER PO) Take 1 tablet by mouth at bedtime. Reported on 09/27/2015    . escitalopram (LEXAPRO) 10 MG tablet 1/2 tablet by mouth daily for 1 week, then increase to a full tab once daily on week two. 30 tablet 0  . Fluticasone-Salmeterol (ADVAIR) 100-50 MCG/DOSE AEPB Inhale 1 puff into the lungs 2 (two) times daily. 1 each 3  . meclizine (ANTIVERT) 25 MG tablet Take one tablet every 8 hours as needed for dizziness 30 tablet 0  .  temazepam (RESTORIL) 15 MG capsule TAKE 1 CAPSULE BY MOUTH AT BEDTIME AS NEEDED FOR SLEEP 30 capsule 0   No current facility-administered medications on file prior to visit.     BP (!) 109/59 (BP Location: Right Arm, Cuff Size: Normal)   Pulse 66   Temp 98 F (36.7 C) (Oral)   Resp 16   Ht 5\' 3"  (1.6 m)   Wt 121 lb 3.2 oz (55 kg)   SpO2 98% Comment: room air  BMI 21.47 kg/m    Objective:   Physical Exam  Constitutional: She is oriented to person, place, and time. She appears well-developed and well-nourished.  Neurological: She is alert and oriented to person, place, and time.  Psychiatric: She has a normal mood and affect. Her behavior is normal. Judgment and thought content normal.          Assessment & Plan:

## 2016-06-10 NOTE — Assessment & Plan Note (Signed)
Uncontrolled. Will increase lexapro from 10mg  to 20mg . I am hopeful that she will sleep better on the higher dose as well.

## 2016-06-19 ENCOUNTER — Encounter: Payer: Self-pay | Admitting: Family

## 2016-06-23 MED FILL — TEMAZEPAM 15 MG CAPSULE: 15 | 30 days supply | Qty: 30 | Fill #0

## 2016-07-13 MED FILL — ESCITALOPRAM 20 MG TABLET: 20 | 30 days supply | Qty: 30 | Fill #1

## 2016-07-22 ENCOUNTER — Encounter: Payer: Self-pay | Admitting: Family

## 2016-07-22 ENCOUNTER — Ambulatory Visit (INDEPENDENT_AMBULATORY_CARE_PROVIDER_SITE_OTHER): Payer: 59 | Admitting: Family

## 2016-07-22 DIAGNOSIS — Z87891 Personal history of nicotine dependence: Secondary | ICD-10-CM

## 2016-07-22 DIAGNOSIS — J440 Chronic obstructive pulmonary disease with acute lower respiratory infection: Secondary | ICD-10-CM

## 2016-07-22 DIAGNOSIS — F419 Anxiety disorder, unspecified: Secondary | ICD-10-CM | POA: Diagnosis not present

## 2016-07-22 DIAGNOSIS — G4700 Insomnia, unspecified: Secondary | ICD-10-CM | POA: Diagnosis not present

## 2016-07-22 MED ORDER — ESCITALOPRAM OXALATE 20 MG PO TABS: 20.0000 mg | ORAL_TABLET | Freq: Every day | ORAL | 1 refills | Status: DC

## 2016-07-22 MED ORDER — TEMAZEPAM 15 MG PO CAPS: ORAL_CAPSULE | ORAL | 0 refills | Status: DC

## 2016-07-22 MED FILL — TEMAZEPAM 15 MG CAPSULE: 15 | 30 days supply | Qty: 30 | Fill #0

## 2016-07-22 NOTE — Progress Notes (Signed)
Pre visit review using our clinic review tool, if applicable. No additional management support is needed unless otherwise documented below in the visit note. 

## 2016-07-22 NOTE — Progress Notes (Signed)
Subjective:    Patient ID: Taylor Atkins, female    DOB: 01-Jan-1956, 60 y.o.   MRN: [id number]  HPI  [name] is a 60 yr old female who presents today for follow up.  Anxiety- last visit we increased her lexapro from 10mg  to 20mg . Reports feeling happier, calmer. Denies panic attacks.    Insomnia- She reports that some nights she has trouble staying asleep.    smoking cessation- remains smoke free since May 2017.    COPD-Has not needed any ofher inhalers since she quit smoking.  Review of Systems    see HPI  Past Medical History:  Diagnosis Date  . Acne   . Anxiety   . Goiter   . Osteopenia   . Skin cancer 6/15  . Thyroid disease   . Tobacco use disorder      Social History   Social History  . Marital status: Married    Spouse name: N/A  . Number of children: N/A  . Years of education: N/A   Occupational History  . Not on file.   Social History Main Topics  . Smoking status: Former Smoker    Packs/day: 1.00    Years: 40.00    Types: Cigarettes    Quit date: 01/13/2016  . Smokeless tobacco: Never Used  . Alcohol use 0.0 oz/week     Comment: seldom  . Drug use: No  . Sexual activity: Yes   Other Topics Concern  . 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 [place]  . [place]  . [place]  . TUBAL LIGATION  1993    Family History  Problem Relation Age of Onset  . Hypertension Mother     Afib, living  . Heart disease Father     Afib, pacemaker, PVD  . Hypertension Father   . Diabetes Brother   . Hypertension Brother   . Diabetes Brother   . Hypertension Brother   . Colon cancer Neg Hx     Allergies  Allergen Reactions  . Fosamax [Alendronate  Sodium] Other (See Comments)    Extreme nausea    Current Outpatient Prescriptions on File Prior to Visit  Medication Sig Dispense Refill  . albuterol (PROVENTIL HFA;VENTOLIN HFA) 108 (90 BASE) MCG/ACT inhaler Inhale 2 inhalations into lungs q8h prn wheezing 1 Inhaler 2  . Calcium Carbonate-Vitamin D 600-400 MG-UNIT per chew tablet Chew 1 tablet by mouth 2 (two) times daily.    Marland Kitchen escitalopram (LEXAPRO) 20 MG tablet Take 1 tablet (20 mg total) by mouth at bedtime. 30 tablet 1  . Fluticasone-Salmeterol (ADVAIR) 100-50 MCG/DOSE AEPB Inhale 1 puff into the lungs 2 (two) times daily. 1 each 3  . meclizine (ANTIVERT) 25 MG tablet Take one tablet every 8 hours as needed for dizziness 30 tablet 0  . temazepam (RESTORIL) 15 MG capsule TAKE 1 CAPSULE BY MOUTH AT BEDTIME AS NEEDED FOR SLEEP 30 capsule 0   No current facility-administered medications on file prior to visit.     BP (!) 118/56 (BP Location: Right Arm, Patient Position: Sitting, Cuff Size: Normal)   Pulse 62   Temp 98.1  F (36.7 C) (Oral)   Resp 16   Ht 5\' 3"  (1.6 m)   Wt 125 lb 3.2 oz (56.8 kg)   SpO2 99% Comment: RA  BMI 22.18 kg/m    Objective:   Physical Exam  Constitutional: She is oriented to person, place, and time. She appears well-developed and well-nourished.  HENT:  Head: Normocephalic and atraumatic.  Cardiovascular: Normal rate, regular rhythm and normal heart sounds.   No murmur heard. Pulmonary/Chest: Effort normal and breath sounds normal. No respiratory distress. She has no wheezes.  Neurological: She is alert and oriented to person, place, and time.  Psychiatric: She has a normal mood and affect. Her behavior is normal. Judgment and thought content normal.          Assessment & Plan:

## 2016-07-22 NOTE — Assessment & Plan Note (Signed)
Commended pt on remaining smoke free.

## 2016-07-22 NOTE — Assessment & Plan Note (Signed)
Improved on lexapro 20mg   Continue current dose.

## 2016-07-22 NOTE — Patient Instructions (Signed)
You can try melatonin in place of temazepam as needed for sleep. Continue current dose of lexapro.

## 2016-07-22 NOTE — Assessment & Plan Note (Signed)
Wants to come off of temazepam if she can.  She wishes to try melatonin instead and will let me know how she does on this.

## 2016-07-22 NOTE — Assessment & Plan Note (Signed)
Stable without need for inhalers.  Monitor.

## 2016-08-23 ENCOUNTER — Other Ambulatory Visit: Payer: Self-pay | Admitting: Family

## 2016-08-24 MED ORDER — TEMAZEPAM 15 MG PO CAPS: ORAL_CAPSULE | ORAL | 0 refills | Status: DC

## 2016-08-24 MED FILL — TEMAZEPAM 15 MG CAPSULE: 15 | 30 days supply | Qty: 30 | Fill #0

## 2016-08-24 NOTE — Telephone Encounter (Signed)
Patient is calling to follow up on this refill as she took her last dose last night and she would like to get the refill today. Please advise

## 2016-08-24 NOTE — Telephone Encounter (Signed)
Last RF: 07/22/16, #30 UDS: 05/15/16 Last OV: 07/22/16 Next OV: due for cpe at her convenience  Rx printed and forwarded to PCP for signature.

## 2016-08-24 NOTE — Telephone Encounter (Signed)
Rx faxed to pharmacy and message sent to pt.

## 2016-09-21 ENCOUNTER — Other Ambulatory Visit: Payer: Self-pay | Admitting: Family

## 2016-09-21 NOTE — Telephone Encounter (Signed)
Last OV: 07/2016 Next OV:  Advised CPE at earliest convenience but has not scheduled yet. Last refill:  08/24/17, #30 UDS: 05/2016 and due now.  Pt will need to pick up Rx and provide UDS at that time. Rx printed and forwarded to PCP for signature.

## 2016-09-22 NOTE — Telephone Encounter (Signed)
Rx placed at front desk with note to obtain UDS at pick up and mychart message sent to pt.

## 2016-09-28 MED FILL — TEMAZEPAM 15 MG CAPSULE: 15 | 30 days supply | Qty: 30 | Fill #0

## 2016-10-24 ENCOUNTER — Other Ambulatory Visit: Payer: Self-pay | Admitting: Family

## 2016-10-27 MED ORDER — TEMAZEPAM 15 MG PO CAPS: 15.0000 mg | ORAL_CAPSULE | Freq: Every evening | ORAL | 0 refills | Status: DC | PRN

## 2016-10-27 MED FILL — TEMAZEPAM 15 MG CAPSULE: 15 | 30 days supply | Qty: 30 | Fill #0

## 2016-10-27 NOTE — Telephone Encounter (Signed)
Last temazepam RX: 09/21/16, #30 Last OV: 07/22/16 Next OV: due for cpe, not scheduled UDS: moderate, 09/28/16 and due 12/27/16.  Rx printed and forwarded to PCP for signature.

## 2016-10-27 NOTE — Telephone Encounter (Signed)
Rx faxed to pharmacy  

## 2016-11-02 ENCOUNTER — Other Ambulatory Visit: Payer: Self-pay | Admitting: Family

## 2016-11-25 ENCOUNTER — Other Ambulatory Visit: Payer: Self-pay | Admitting: Family

## 2016-11-27 MED ORDER — TEMAZEPAM 15 MG PO CAPS: 15.0000 mg | ORAL_CAPSULE | Freq: Every evening | ORAL | 0 refills | Status: DC | PRN

## 2016-11-27 MED FILL — TEMAZEPAM 15 MG CAPSULE: 15 | 30 days supply | Qty: 30 | Fill #0

## 2016-11-27 NOTE — Telephone Encounter (Signed)
Pt walked in to the office stating she sent mychart request on 11/25/16 and is out of medication. Would like to get rx now.  Last temazepam RX: 10/27/16, #30 Last OV: 07/22/16 Next OV: none scheduled UDS:  Moderate 09/28/16 and due 12/27/16  Rx printed and forwarded to PCP for signature.

## 2016-12-21 ENCOUNTER — Other Ambulatory Visit: Payer: Self-pay | Admitting: Family

## 2016-12-22 MED ORDER — TEMAZEPAM 15 MG PO CAPS: 15.0000 mg | ORAL_CAPSULE | Freq: Every evening | ORAL | 0 refills | Status: DC | PRN

## 2016-12-22 NOTE — Telephone Encounter (Signed)
Last RX: 11/27/16, #30 Last OV: 07/2016 Next OV: due anytime for cpe UDS: 09/28/16, moderate  Rx printed with note NOT to fill before 11/25/16 and forwarded to PCP for signature.

## 2016-12-22 NOTE — Telephone Encounter (Signed)
Rx faxed to pharmacy  

## 2016-12-26 ENCOUNTER — Other Ambulatory Visit: Payer: Self-pay | Admitting: Family

## 2016-12-26 IMAGING — DX DG CHEST 2V
2 series · 2 of 2 positions shown · non-contrast
Comparison: Chest CT scan dated July 26, 2012

CLINICAL DATA: One-week history of cough congestion and wheezing,
current smoker, history of COPD, multinodular goiter.

EXAM:
CHEST  2 VIEW

[chest pa]
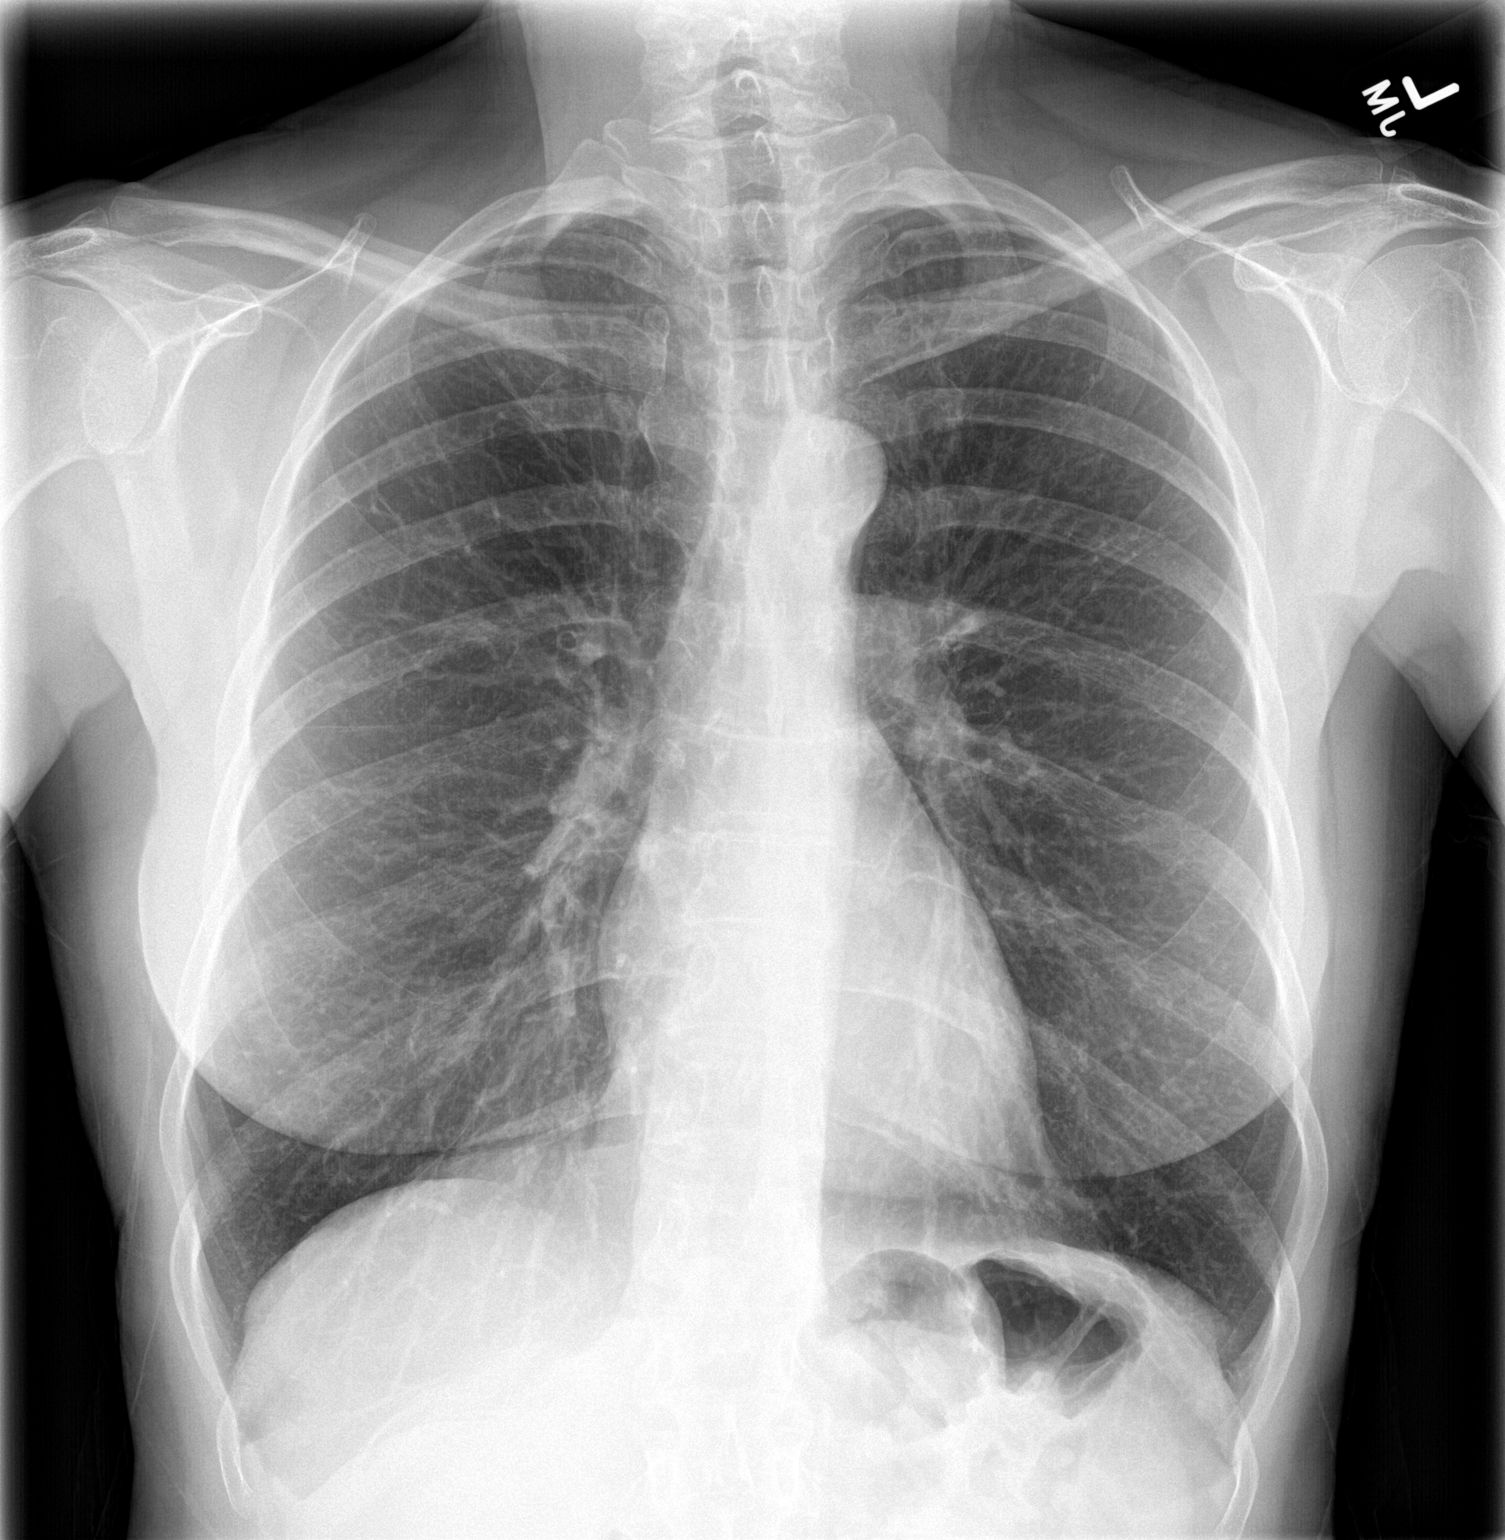

[chest lat]
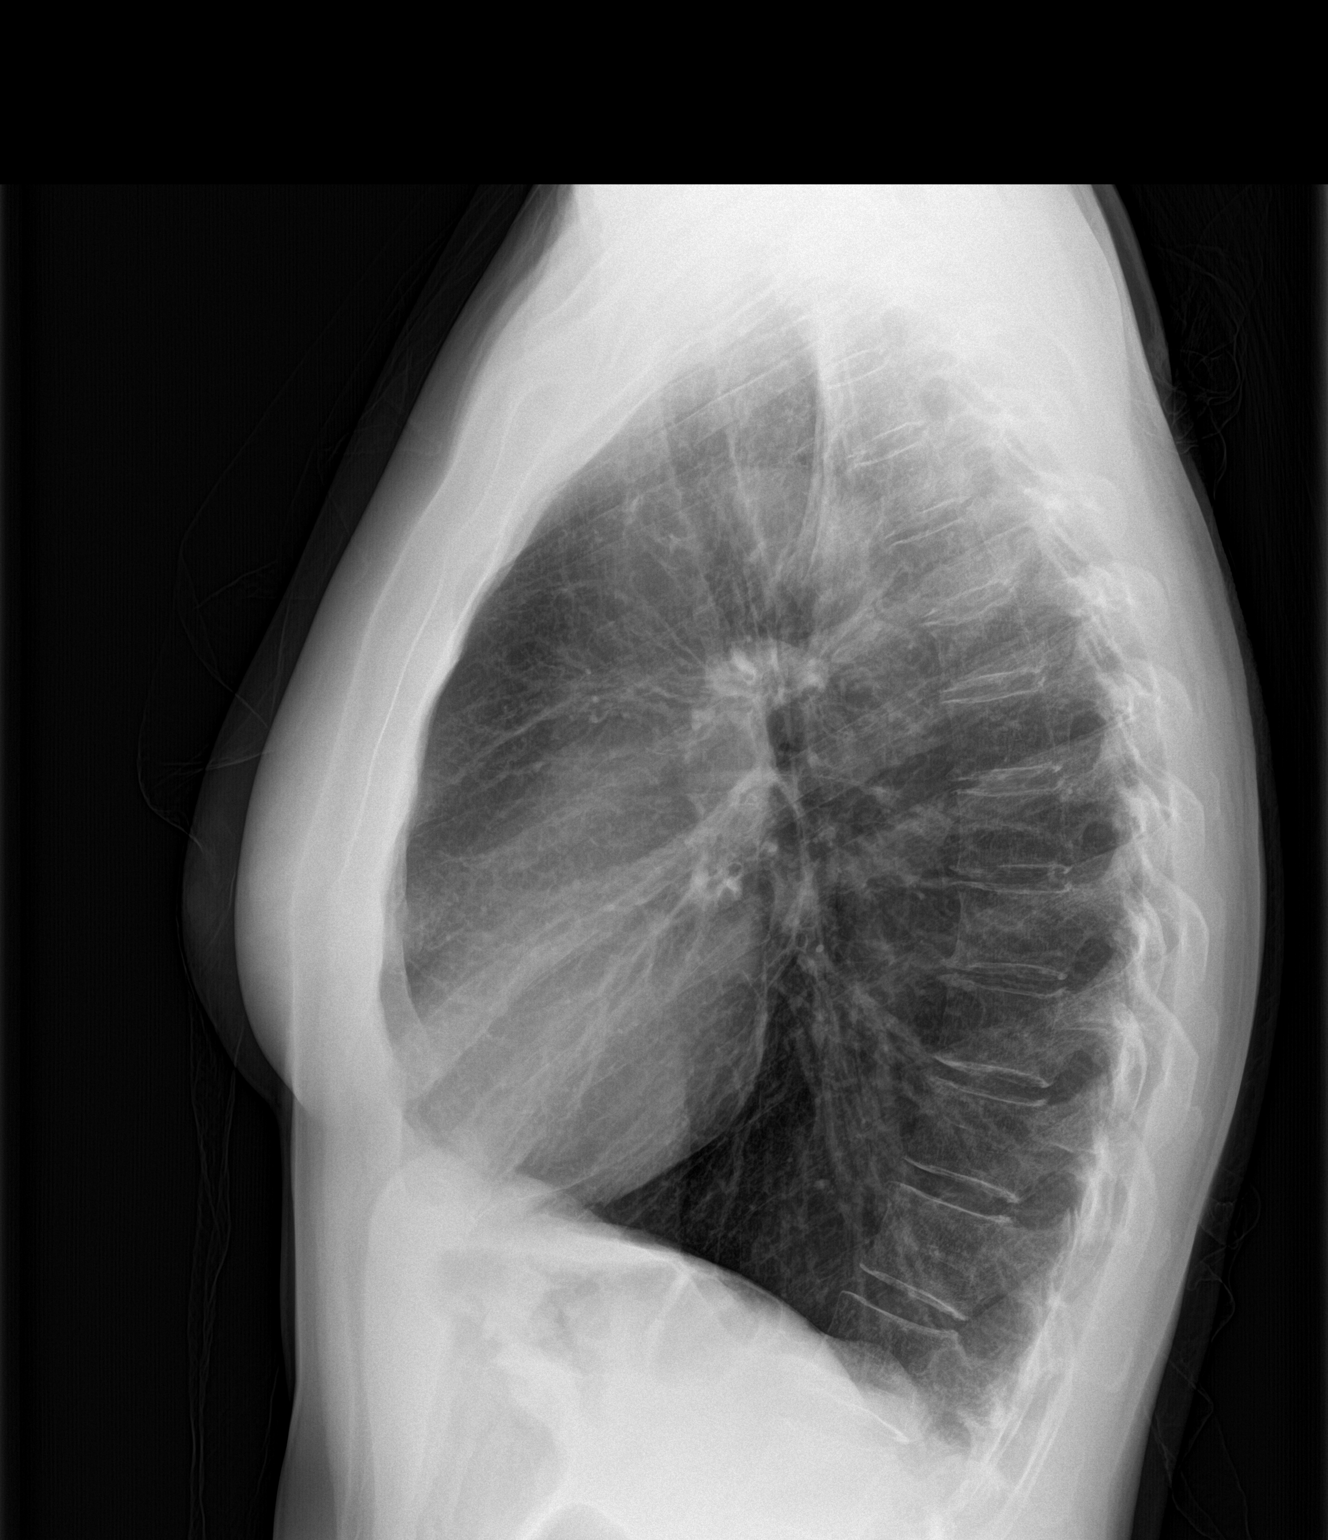

[2 of 2 positions shown; findings below may reference images not displayed]

FINDINGS: The lungs remain mildly hyperinflated with hemidiaphragm flattening.
There is no focal infiltrate. There is no pleural effusion. No
pulmonary parenchymal nodules are observed. The heart is normal in
size. The pulmonary vascularity is not engorged. The bony thorax
exhibits no acute abnormality.
IMPRESSION: COPD.  There is no active cardiopulmonary disease.

## 2016-12-28 MED FILL — TEMAZEPAM 15 MG CAPSULE: 15 | 30 days supply | Qty: 30 | Fill #0

## 2016-12-28 NOTE — Telephone Encounter (Signed)
eScribe request from [facility] for refill on Escitalopram 20 mg tab Last filled - 07/22/16, #90x1 Last AEX - 07/22/16 Next AEX - [Instructions: Return for annual physical at your convenience], no future appointment scheduled Please Advise on refills/SLS 04/23

## 2017-01-28 ENCOUNTER — Encounter: Payer: Self-pay | Admitting: Family

## 2017-01-28 ENCOUNTER — Other Ambulatory Visit: Payer: Self-pay | Admitting: Family

## 2017-01-29 MED ORDER — TEMAZEPAM 15 MG PO CAPS: 15.0000 mg | ORAL_CAPSULE | Freq: Every evening | ORAL | 0 refills | Status: DC | PRN

## 2017-01-29 NOTE — Telephone Encounter (Signed)
See 01/28/17 refill request.

## 2017-01-29 NOTE — Telephone Encounter (Signed)
Message sent to pt and Rx placed at front desk for pick up.

## 2017-01-29 NOTE — Telephone Encounter (Signed)
Last temazepam RX: 12/22/2016 Last OV: 07/2016 Next OV: none scheduled UDS:  09/28/16 and was due 12/27/16. Pt will need to pick up rx and provide uds at that time. Will send message to pt to also schedule cpe soon.  Rx printed and forwarded to PCP for signature.

## 2017-02-04 ENCOUNTER — Encounter: Payer: Self-pay | Admitting: Family

## 2017-02-04 MED FILL — TEMAZEPAM 15 MG CAPSULE: 15 | 30 days supply | Qty: 30 | Fill #0

## 2017-02-16 ENCOUNTER — Ambulatory Visit (INDEPENDENT_AMBULATORY_CARE_PROVIDER_SITE_OTHER): Payer: 59 | Admitting: Family

## 2017-02-16 ENCOUNTER — Encounter: Payer: Self-pay | Admitting: Family

## 2017-02-16 ENCOUNTER — Other Ambulatory Visit: Payer: Self-pay | Admitting: Family

## 2017-02-16 ENCOUNTER — Ambulatory Visit (HOSPITAL_BASED_OUTPATIENT_CLINIC_OR_DEPARTMENT_OTHER)
Admission: RE | Admit: 2017-02-16 | Discharge: 2017-02-16 | Disposition: A | Discharge status: Home / Self Care | Payer: 59 | Source: Ambulatory Visit | Attending: Family | Admitting: Family

## 2017-02-16 VITALS — BP 109/59 | HR 60 | Temp 98.5°F | Resp 16 | Ht 63.0 in | Wt 131.4 lb

## 2017-02-16 DIAGNOSIS — G4700 Insomnia, unspecified: Secondary | ICD-10-CM | POA: Diagnosis not present

## 2017-02-16 DIAGNOSIS — Z0000 Encounter for general adult medical examination without abnormal findings: Secondary | ICD-10-CM | POA: Diagnosis not present

## 2017-02-16 DIAGNOSIS — E2839 Other primary ovarian failure: Secondary | ICD-10-CM

## 2017-02-16 DIAGNOSIS — R928 Other abnormal and inconclusive findings on diagnostic imaging of breast: Secondary | ICD-10-CM | POA: Diagnosis not present

## 2017-02-16 DIAGNOSIS — Z1231 Encounter for screening mammogram for malignant neoplasm of breast: Secondary | ICD-10-CM | POA: Insufficient documentation

## 2017-02-16 DIAGNOSIS — Z1159 Encounter for screening for other viral diseases: Secondary | ICD-10-CM

## 2017-02-16 LAB — LIPID PANEL
CHOL/HDL RATIO: 3
Cholesterol: 162 mg/dL (ref 0–200)
HDL: 56.5 mg/dL (ref >39.00)
LDL Cholesterol: 97 mg/dL (ref 0–99)
NONHDL: 105.46
Triglycerides: 41 mg/dL (ref 0.0–149.0)
VLDL: 8.2 mg/dL (ref 0.0–40.0)

## 2017-02-16 LAB — CBC WITH DIFFERENTIAL/PLATELET
BASOS ABS: 0 10*3/uL (ref 0.0–0.1)
BASOS PCT: 0.7 % (ref 0.0–3.0)
Eosinophils Absolute: 0.2 10*3/uL (ref 0.0–0.7)
Eosinophils Relative: 3.6 % (ref 0.0–5.0)
HEMATOCRIT: 41.5 % (ref 36.0–46.0)
HEMOGLOBIN: 14 g/dL (ref 12.0–15.0)
LYMPHS PCT: 37.3 % (ref 12.0–46.0)
Lymphs Abs: 2.1 10*3/uL (ref 0.7–4.0)
MCHC: 33.8 g/dL (ref 30.0–36.0)
MCV: 86.4 fl (ref 78.0–100.0)
MONOS PCT: 6.8 % (ref 3.0–12.0)
Monocytes Absolute: 0.4 10*3/uL (ref 0.1–1.0)
Neutro Abs: 2.9 10*3/uL (ref 1.4–7.7)
Neutrophils Relative %: 51.6 % (ref 43.0–77.0)
Platelets: 236 10*3/uL (ref 150.0–400.0)
RBC: 4.8 Mil/uL (ref 3.87–5.11)
RDW: 13.2 % (ref 11.5–15.5)
WBC: 5.6 10*3/uL (ref 4.0–10.5)

## 2017-02-16 LAB — HEPATIC FUNCTION PANEL
ALK PHOS: 69 U/L (ref 39–117)
ALT: 11 U/L (ref 0–35)
AST: 15 U/L (ref 0–37)
Albumin: 4.3 g/dL (ref 3.5–5.2)
BILIRUBIN DIRECT: 0.1 mg/dL (ref 0.0–0.3)
TOTAL PROTEIN: 6.5 g/dL (ref 6.0–8.3)
Total Bilirubin: 0.4 mg/dL (ref 0.2–1.2)

## 2017-02-16 LAB — URINALYSIS, ROUTINE W REFLEX MICROSCOPIC
BILIRUBIN URINE: NEGATIVE
KETONES UR: NEGATIVE
LEUKOCYTES UA: NEGATIVE
Nitrite: NEGATIVE
SPECIFIC GRAVITY, URINE: 1.01 (ref 1.000–1.030)
Total Protein, Urine: NEGATIVE
UROBILINOGEN UA: 0.2 (ref 0.0–1.0)
Urine Glucose: NEGATIVE
pH: 7 (ref 5.0–8.0)

## 2017-02-16 LAB — BASIC METABOLIC PANEL
BUN: 14 mg/dL (ref 6–23)
CHLORIDE: 105 meq/L (ref 96–112)
CO2: 33 mEq/L — ABNORMAL HIGH (ref 19–32)
Calcium: 9.5 mg/dL (ref 8.4–10.5)
Creatinine, Ser: 0.91 mg/dL (ref 0.40–1.20)
GFR: 66.31 mL/min (ref >60.00)
Glucose, Bld: 95 mg/dL (ref 70–99)
Potassium: 4.1 mEq/L (ref 3.5–5.1)
SODIUM: 140 meq/L (ref 135–145)

## 2017-02-16 LAB — TSH: TSH: 0.26 u[IU]/mL — AB (ref 0.35–4.50)

## 2017-02-16 LAB — HEPATITIS C ANTIBODY: HCV AB: NEGATIVE

## 2017-02-16 MED ORDER — TRAZODONE HCL 50 MG PO TABS: 25.0000 mg | ORAL_TABLET | Freq: Every evening | ORAL | 0 refills | Status: DC | PRN

## 2017-02-16 NOTE — Progress Notes (Addendum)
Subjective:    Patient ID: Taylor Atkins, female    DOB: 13-Jul-1956, 61 y.o.   MRN: [id number]  HPI  Taylor Atkins is a 61 yr old female who presents today for cpx.  Patient presents today for complete physical.  Immunizations: had prevnar 11/15 Diet: diet is fair Exercise:  Reports no reguar exercise.   Wt Readings from Last 3 Encounters:  02/16/17 131 lb 6.4 oz (59.6 kg)  07/22/16 125 lb 3.2 oz (56.8 kg)  06/10/16 121 lb 3.2 oz (55 kg)    Colonoscopy:1/17 Dexa:9/16 Pap Smear: 10//16 Mammogram: 9/16 Dental: up to date, planing gum surgery with periodontist Vision:  Reports up to date  Insomnia- continues restoril. Wants to come off of controlled substance.     Review of Systems  Constitutional: Positive for unexpected weight change.  HENT: Negative for hearing loss and rhinorrhea.   Eyes: Negative for visual disturbance.  Respiratory: Negative for cough.   Cardiovascular: Negative for leg swelling.  Gastrointestinal: Negative for constipation and diarrhea.  Genitourinary: Negative for dysuria and frequency.  Musculoskeletal:       Occasional right arm pain, aching  Neurological: Negative for headaches.  Hematological: Negative for adenopathy.  Psychiatric/Behavioral:       Denies depression/anxiety      Past Medical History:  Diagnosis Date  . Acne   . Anxiety   . Goiter   . Osteopenia   . Skin cancer 6/15  . Thyroid disease   . Tobacco use disorder      Social History   Social History  . Marital status: Married    Spouse name: N/A  . Number of children: N/A  . Years of education: N/A   Occupational History  . Not on file.   Social History Main Topics  . Smoking status: Former Smoker    Packs/day: 1.00    Years: 40.00    Types: Cigarettes    Quit date: 01/13/2016  . Smokeless tobacco: Never Used  . Alcohol use 0.0 oz/week     Comment: seldom  . Drug use: No  . Sexual activity: Yes   Other Topics Concern  . 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 [place]  . [place]  . [place]  . TUBAL LIGATION  1993    Family History  Problem Relation Age of Onset  . Hypertension Mother        Afib, living  . Heart disease Father        Afib, pacemaker, PVD  . Hypertension Father   . Diabetes Brother   . Hypertension Brother   . Diabetes Brother   . Hypertension Brother   . Colon cancer Neg Hx     Allergies  Allergen Reactions  . Fosamax [Alendronate Sodium] Other (See Comments)    Extreme nausea    Current Outpatient Prescriptions on File Prior to Visit  Medication Sig Dispense Refill  . Calcium Carbonate-Vitamin D 600-400 MG-UNIT per chew tablet Chew 1 tablet by mouth 2 (two) times daily.    Marland Kitchen escitalopram (LEXAPRO) 20 MG tablet TAKE 1 TABLET BY MOUTH AT  BEDTIME 90 tablet 0  .  meclizine (ANTIVERT) 25 MG tablet Take one tablet every 8 hours as needed for dizziness 30 tablet 0  . temazepam (RESTORIL) 15 MG capsule Take 1 capsule (15 mg total) by mouth at bedtime as needed for sleep. 30 capsule 0   No current facility-administered medications on file prior to visit.     BP (!) 109/59 (BP Location: Right Arm, Cuff Size: Normal)   Pulse 60   Temp 98.5 F (36.9 C) (Oral)   Resp 16   Ht 5\' 3"  (1.6 m)   Wt 131 lb 6.4 oz (59.6 kg)   SpO2 99%   BMI 23.28 kg/m    Objective:   Physical Exam Physical Exam  Constitutional: She is oriented to person, place, and time. She appears well-developed and well-nourished. No distress.  HENT:  Head: Normocephalic and atraumatic.  Right Ear: Tympanic membrane and ear canal normal.  Left Ear: Tympanic membrane and ear canal normal.  Mouth/Throat: Oropharynx is clear and moist.    Eyes: Pupils are equal, round, and reactive to light. No scleral icterus.  Neck: Normal range of motion. No thyromegaly present.  Cardiovascular: Normal rate and regular rhythm.   No murmur heard. Pulmonary/Chest: Effort normal and breath sounds normal. No respiratory distress. He has no wheezes. She has no rales. She exhibits no tenderness.  Abdominal: Soft. Bowel sounds are normal. She exhibits no distension and no mass. There is no tenderness. There is no rebound and no guarding.  Musculoskeletal: She exhibits no edema.  Lymphadenopathy:    She has no cervical adenopathy.  Neurological: She is alert and oriented to person, place, and time. She has normal patellar reflexes. She exhibits normal muscle tone. Coordination normal.  Skin: Skin is warm and dry.  Psychiatric: She has a normal mood and affect. Her behavior is normal. Judgment and thought content normal.  Breasts: Examined lying Right: Without masses, retractions, discharge or axillary adenopathy.  Left: Without masses, retractions, discharge or axillary adenopathy.  Pelvic: deferred        Assessment & Plan:         Assessment & Plan:  Preventative care- discussed healthy diet, exercise, obtain routine labs, mammogram. Candidate for shingrix. She is advised to call her insurance and verify coverage, then call our office for nurse visit.   Insomnia-  D/c restoril, begin trial of trazodone.    EKG tracing is personally reviewed.  EKG notes NSR.  No acute changes.

## 2017-02-16 NOTE — Patient Instructions (Addendum)
  Please schedule mammogram on the first floor. Continue to work on healthy diet and regular exercise.  Complete lab work prior to leaving. Stop restoril. Start trazodone.

## 2017-02-17 ENCOUNTER — Other Ambulatory Visit: Payer: Self-pay | Admitting: Family

## 2017-02-17 DIAGNOSIS — R928 Other abnormal and inconclusive findings on diagnostic imaging of breast: Secondary | ICD-10-CM

## 2017-02-18 ENCOUNTER — Telehealth: Payer: Self-pay | Admitting: *Deleted

## 2017-02-18 ENCOUNTER — Telehealth: Payer: Self-pay | Admitting: Family

## 2017-02-18 DIAGNOSIS — R3129 Other microscopic hematuria: Secondary | ICD-10-CM

## 2017-02-18 DIAGNOSIS — E0590 Thyrotoxicosis, unspecified without thyrotoxic crisis or storm: Secondary | ICD-10-CM

## 2017-02-18 NOTE — Telephone Encounter (Signed)
Received Physician Orders from [facility], forwarded to provider/SLS 06/14

## 2017-02-18 NOTE — Telephone Encounter (Addendum)
Please let pt know that I reviewed her lab work. Hep C testing is negative. Thyroid looks mildly overactive. Please ask lab to add on free T3 and free T4 (dx hyperthyroid). Also, lets have her complete a thyroid ultrasound.  Lab did not run UA for RBC, can they please add this as well?  Other lab work looks good.

## 2017-02-19 LAB — T3, FREE: T3 FREE: 3.2 pg/mL (ref 2.3–4.2)

## 2017-02-19 LAB — T4, FREE: Free T4: 1.1 ng/dL (ref 0.8–1.8)

## 2017-02-19 NOTE — Telephone Encounter (Signed)
OK, lets repeat UA at her convenience.

## 2017-02-19 NOTE — Telephone Encounter (Signed)
[facility] is able to add the free T3 and free T4. Pt would have to come back to repeat the UA - [facility] couldn't figure out why the tech didn't run a micro on the RBC.

## 2017-02-19 NOTE — Telephone Encounter (Addendum)
Notified pt and scheduled lab appt for repeat U/A for Tuesday at 1:15pm. Lab order entered. Pt was transferred to radiology to schedule u/s.

## 2017-02-19 NOTE — Telephone Encounter (Signed)
Message sent to lab to see if tests can be added. Will notify pt of results once I hear back from Lab.

## 2017-02-22 ENCOUNTER — Encounter: Payer: Self-pay | Admitting: Family

## 2017-02-22 ENCOUNTER — Telehealth: Payer: Self-pay | Admitting: Family

## 2017-02-22 DIAGNOSIS — E0590 Thyrotoxicosis, unspecified without thyrotoxic crisis or storm: Secondary | ICD-10-CM | POA: Insufficient documentation

## 2017-02-22 NOTE — Telephone Encounter (Signed)
-----   Message from Katha Hamming sent at 02/22/2017  2:53 PM EDT ----- Regarding: ultrasound Pamala Hurry called and cancelled her ultrasound soft tissue head and neck for tomorrow, 02/23/17.  Stated she is going to have lab work and may be after that reschedule her ultrasound in the future.  Thanks, Hoyle Sauer

## 2017-02-22 NOTE — Telephone Encounter (Signed)
-----   Message from Katha Hamming sent at 02/22/2017  2:55 PM EDT ----- Regarding: ultrasound Pamala Hurry cancelled her ultrasound soft tissue head and neck for tomorrow, 02/23/17.   Stated she is having some lab work in the future and then maybe after that she will reschedule the ultrasound.  Thanks, Hoyle Sauer

## 2017-02-23 ENCOUNTER — Other Ambulatory Visit: Payer: 59

## 2017-02-23 ENCOUNTER — Ambulatory Visit
Admission: RE | Admit: 2017-02-23 | Discharge: 2017-02-23 | Disposition: A | Discharge status: Home / Self Care | Payer: 59 | Source: Ambulatory Visit | Attending: Family | Admitting: Family

## 2017-02-23 ENCOUNTER — Ambulatory Visit (HOSPITAL_BASED_OUTPATIENT_CLINIC_OR_DEPARTMENT_OTHER): Payer: 59

## 2017-02-23 DIAGNOSIS — R928 Other abnormal and inconclusive findings on diagnostic imaging of breast: Secondary | ICD-10-CM

## 2017-02-23 LAB — URINALYSIS, ROUTINE W REFLEX MICROSCOPIC
Bilirubin Urine: NEGATIVE
GLUCOSE, UA: NEGATIVE
KETONES UR: NEGATIVE
Leukocytes, UA: NEGATIVE
Nitrite: NEGATIVE
Protein, ur: NEGATIVE
SPECIFIC GRAVITY, URINE: 1.013 (ref 1.001–1.035)
pH: 5.5 (ref 5.0–8.0)

## 2017-02-23 LAB — URINALYSIS, MICROSCOPIC ONLY
BACTERIA UA: NONE SEEN [HPF]
CASTS: NONE SEEN [LPF]
CRYSTALS: NONE SEEN [HPF]
Squamous Epithelial / LPF: NONE SEEN [HPF] (ref <=5)
WBC, UA: NONE SEEN WBC/HPF (ref <=5)
YEAST: NONE SEEN [HPF]

## 2017-03-13 ENCOUNTER — Other Ambulatory Visit: Payer: Self-pay | Admitting: Family

## 2017-04-11 ENCOUNTER — Other Ambulatory Visit: Payer: Self-pay | Admitting: Family

## 2017-04-12 NOTE — Telephone Encounter (Signed)
Last ov 02/16/17 Last refill 02/16/17, #90 0, Please advise. LB

## 2017-04-16 NOTE — Telephone Encounter (Signed)
Refill done.  

## 2017-05-19 ENCOUNTER — Telehealth: Payer: Self-pay

## 2017-05-19 ENCOUNTER — Ambulatory Visit (INDEPENDENT_AMBULATORY_CARE_PROVIDER_SITE_OTHER): Payer: 59 | Admitting: Family

## 2017-05-19 ENCOUNTER — Encounter: Payer: Self-pay | Admitting: Family

## 2017-05-19 VITALS — BP 102/58 | HR 72 | Temp 98.1°F | Resp 16 | Ht 63.0 in | Wt 125.6 lb

## 2017-05-19 DIAGNOSIS — F419 Anxiety disorder, unspecified: Secondary | ICD-10-CM

## 2017-05-19 DIAGNOSIS — L989 Disorder of the skin and subcutaneous tissue, unspecified: Secondary | ICD-10-CM

## 2017-05-19 DIAGNOSIS — Z720 Tobacco use: Secondary | ICD-10-CM

## 2017-05-19 DIAGNOSIS — M549 Dorsalgia, unspecified: Secondary | ICD-10-CM | POA: Diagnosis not present

## 2017-05-19 MED ORDER — VARENICLINE TARTRATE 0.5 MG X 11 & 1 MG X 42 PO MISC: ORAL | 0 refills | Status: DC

## 2017-05-19 MED ORDER — MELOXICAM 7.5 MG PO TABS: 7.5000 mg | ORAL_TABLET | Freq: Every day | ORAL | 0 refills | Status: DC | PRN

## 2017-05-19 MED FILL — MELOXICAM 7.5 MG TABLET: 7.5 | 30 days supply | Qty: 30 | Fill #0

## 2017-05-19 NOTE — Telephone Encounter (Signed)
PA initiated via Covermymeds; KEY: [id number]. Awaiting determination.

## 2017-05-19 NOTE — Patient Instructions (Addendum)
Please begin meloxicam for your back pain.   Let me know if new/worsening symptoms or if not improved in a few weeks and we will set you up with physical therapy.

## 2017-05-19 NOTE — Progress Notes (Signed)
Subjective:    Patient ID: Taylor Atkins, female    DOB: 1956-06-18, 61 y.o.   MRN: [id number]  HPI  Ms. Below is a 61 yr old female who presents today for follow up.  1) anxiety- maintained on lexapro.  Reports that her mother is living with her.    2) back pain- intermittent back pain x 6 weeks. Reports some low back pain and and some thoracic back pain on the left.standing and using her arms (folding/cooking etc) make it worse. Sitting resolved.  Denies bowel/bladder incontinence.  3) Skin lesion- notes lesion on the top of the left foot x 2 months.  4) Tobacco abuse- recently started smoking again due to stress. Requesting rx for chantix to begin again.    Review of Systems    see HPI  Past Medical History:  Diagnosis Date  . Acne   . Anxiety   . Goiter   . Osteopenia   . Skin cancer 6/15  . Thyroid disease   . Tobacco use disorder      Social History   Social History  . Marital status: Married    Spouse name: N/A  . Number of children: N/A  . Years of education: N/A   Occupational History  . Not on file.   Social History Main Topics  . Smoking status: Former Smoker    Packs/day: 1.00    Years: 40.00    Types: Cigarettes    Quit date: 01/13/2016  . Smokeless tobacco: Never Used  . Alcohol use 0.0 oz/week     Comment: seldom  . Drug use: No  . Sexual activity: Yes   Other Topics Concern  . 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 [place]  . [place]  . [place]  . TUBAL LIGATION  1993    Family History  Problem Relation Age of Onset  . Hypertension Mother        Afib, living  . Heart disease Father        Afib, pacemaker, PVD  . Hypertension Father   . Diabetes Brother   . Hypertension  Brother   . Diabetes Brother   . Hypertension Brother   . Colon cancer Neg Hx     Allergies  Allergen Reactions  . Fosamax [Alendronate Sodium] Other (See Comments)    Extreme nausea    Current Outpatient Prescriptions on File Prior to Visit  Medication Sig Dispense Refill  . Calcium Carbonate-Vitamin D 600-400 MG-UNIT per chew tablet Chew 1 tablet by mouth 2 (two) times daily.    Marland Kitchen escitalopram (LEXAPRO) 20 MG tablet TAKE 1 TABLET BY MOUTH AT  BEDTIME 90 tablet 1  . meclizine (ANTIVERT) 25 MG tablet Take one tablet every 8 hours as needed for dizziness 30 tablet 0  . traZODone (DESYREL) 50 MG tablet TAKE 1/2 TO 1 TABLET BY  MOUTH AT BEDTIME AS NEEDED  FOR SLEEP 90 tablet 0   No current facility-administered medications on file prior to visit.     BP (!) 102/58 (BP Location: Right Arm, Cuff Size: Normal)   Pulse 72   Temp 98.1 F (36.7 C) (Oral)   Resp 16  Ht 5\' 3"  (1.6 m)   Wt 125 lb 9.6 oz (57 kg)   SpO2 97%   BMI 22.25 kg/m    Objective:   Physical Exam  Constitutional: She appears well-developed and well-nourished.  Cardiovascular: Normal rate, regular rhythm and normal heart sounds.   No murmur heard. Pulmonary/Chest: Effort normal and breath sounds normal. No respiratory distress. She has no wheezes.  Musculoskeletal:       Cervical back: She exhibits no tenderness.       Thoracic back: She exhibits no tenderness.  Psychiatric: She has a normal mood and affect. Her behavior is normal. Judgment and thought content normal.  skin:  Raised pink lesion left dorsal foot.        Assessment & Plan:  Back pain- will rx with trial of meloxicam.  Discussed PT referral. She wishes to try meloxicam first. Discussed red flags that should prompt ER visit for back pain such as bowel or bladder incontinence or new onset leg weakness.  Skin lesion- advised pt to schedule follow up with her dermatologist.   Anxiety- stable on lexapro. Continue same.  Tobacco abuse- restart  chantix.  Advised pt To let me know how she is doing in about 3 weeks and I will send a continuation month pack.

## 2017-05-24 MED FILL — CHANTIX STARTING MONTH BOX: 0.5 MG X 11 | 28 days supply | Qty: 53 | Fill #0

## 2017-05-24 NOTE — Telephone Encounter (Signed)
Request Reference Number: [id number]. CHANTIX PAK 0.5& 1MG  is approved through 05/19/2018. For further questions, call [phone].

## 2017-06-07 ENCOUNTER — Other Ambulatory Visit: Payer: Self-pay | Admitting: Family

## 2017-06-09 MED ORDER — MELOXICAM 7.5 MG PO TABS: 7.5000 mg | ORAL_TABLET | Freq: Every day | ORAL | 1 refills | Status: DC | PRN

## 2017-06-24 ENCOUNTER — Other Ambulatory Visit: Payer: Self-pay | Admitting: Family

## 2017-06-27 MED ORDER — VARENICLINE TARTRATE 1 MG PO TABS
1.0000 mg | ORAL_TABLET | Freq: Two times a day (BID) | ORAL | 2 refills | Status: DC
Start: 2017-06-27 — End: 2017-09-20

## 2017-06-28 MED FILL — CHANTIX 1 MG TABLET: 1 | 28 days supply | Qty: 56 | Fill #0

## 2017-06-28 NOTE — Telephone Encounter (Signed)
Contacted pharmacy- advised them to place 1 not 2 refills on chantix.

## 2017-07-01 ENCOUNTER — Other Ambulatory Visit: Payer: Self-pay | Admitting: Family

## 2017-08-03 MED FILL — CHANTIX 1 MG TABLET: 1 | 28 days supply | Qty: 56 | Fill #1

## 2017-08-30 ENCOUNTER — Other Ambulatory Visit: Payer: Self-pay | Admitting: Family

## 2017-09-17 ENCOUNTER — Telehealth: Payer: Self-pay | Admitting: *Deleted

## 2017-09-17 NOTE — Telephone Encounter (Signed)
Ok to send continuation month pack.

## 2017-09-17 NOTE — Telephone Encounter (Signed)
Taylor Atkins-- received fax from [facility] requesting refill of Chantix continuing month pack. Fax states pt only has 6 tablets left.  Please advise?

## 2017-09-20 ENCOUNTER — Telehealth: Payer: Self-pay

## 2017-09-20 MED ORDER — VARENICLINE TARTRATE 1 MG PO TABS: 1.0000 mg | ORAL_TABLET | Freq: Two times a day (BID) | ORAL | 1 refills | Status: DC

## 2017-09-20 NOTE — Telephone Encounter (Signed)
PA initiated via Covermymeds; KEY: [id number]. Awaiting determination.

## 2017-09-20 NOTE — Telephone Encounter (Signed)
This medication or product was previously approved on [id number] From 2017-05-19-To 2018-05-19. You will be able to fill a prescription for this medication at your pharmacy. If your pharmacy has questions regarding the processing of your prescription, please have them call the [facility] pharmacy help desk at ([phone].

## 2017-09-21 MED FILL — CHANTIX 1 MG TABLET: 1 | 28 days supply | Qty: 56 | Fill #0

## 2017-10-22 ENCOUNTER — Encounter: Payer: Self-pay | Admitting: Family

## 2017-10-22 ENCOUNTER — Ambulatory Visit: Payer: 59 | Admitting: Family

## 2017-10-22 VITALS — BP 109/60 | HR 69 | Temp 98.3°F | Resp 16 | Ht 63.0 in | Wt 117.2 lb

## 2017-10-22 DIAGNOSIS — L0231 Cutaneous abscess of buttock: Secondary | ICD-10-CM

## 2017-10-22 MED ORDER — DOXYCYCLINE HYCLATE 100 MG PO TABS: 100.0000 mg | ORAL_TABLET | Freq: Two times a day (BID) | ORAL | 0 refills | Status: DC

## 2017-10-22 MED FILL — DOXYCYCLINE HYCLATE 100 MG: 100 | 7 days supply | Qty: 14 | Fill #0

## 2017-10-22 MED FILL — CHANTIX 1 MG TABLET: 1 | 28 days supply | Qty: 56 | Fill #1

## 2017-10-22 NOTE — Progress Notes (Signed)
Subjective:    Patient ID: Taylor Atkins, female    DOB: 02-May-1956, 62 y.o.   MRN: [id number]  HPI  Is a 62 year old female who presents today with chief complaint of boil located on her left buttock.  She first noted 3 days ago.  She reports area has been red swollen and painful.  Reports that the area began draining yesterday.  She also reports some tender lymph nodes in the left groin.  Review of Systems See HPI  Past Medical History:  Diagnosis Date  . Acne   . Anxiety   . Goiter   . Osteopenia   . Skin cancer 6/15  . Thyroid disease   . Tobacco use disorder      Social History   Socioeconomic History  . Marital status: Married    Spouse name: Not on file  . Number of children: Not on file  . Years of education: Not on file  . Highest education level: Not on file  Social Needs  . Financial resource strain: Not on file  . Food insecurity - worry: Not on file  . Food insecurity - inability: Not on file  . Transportation needs - medical: Not on file  . Transportation needs - non-medical: Not on file  Occupational History  . Not on file  Tobacco Use  . Smoking status: Current Every Day Smoker    Years: 40.00    Types: Cigarettes    Last attempt to quit: 01/13/2016    Years since quitting: 1.7  . Smokeless tobacco: Never Used  . Tobacco comment: 5 CIGARETTES DAILY  Substance and Sexual Activity  . Alcohol use: Yes    Alcohol/week: 0.0 oz    Comment: seldom  . Drug use: No  . Sexual activity: Yes  Other Topics Concern  . 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 [place]  . [place]  . [place]  . TUBAL LIGATION  1993    Family History  Problem Relation Age of Onset  . Hypertension Mother        Afib, living    . Heart disease Father        Afib, pacemaker, PVD  . Hypertension Father   . Diabetes Brother   . Hypertension Brother   . Diabetes Brother   . Hypertension Brother   . Colon cancer Neg Hx     Allergies  Allergen Reactions  . Fosamax [Alendronate Sodium] Other (See Comments)    Extreme nausea    Current Outpatient Medications on File Prior to Visit  Medication Sig Dispense Refill  . Calcium Carbonate-Vitamin D 600-400 MG-UNIT per chew tablet Chew 1 tablet by mouth 2 (two) times daily.    Marland Kitchen escitalopram (LEXAPRO) 20 MG tablet Take 1 tablet (20 mg total) by mouth at bedtime. 90 tablet 0  . meclizine (ANTIVERT) 25 MG tablet Take one tablet every 8 hours as needed for dizziness 30 tablet 0  . meloxicam (MOBIC) 7.5 MG tablet Take 1 tablet (7.5 mg total) by mouth daily as needed for pain. 90 tablet 1  . traZODone (DESYREL) 50 MG tablet TAKE 1/2 TO 1 TABLET BY  MOUTH AT BEDTIME AS NEEDED  FOR SLEEP 90 tablet 1  . varenicline (CHANTIX CONTINUING MONTH PAK) 1 MG tablet Take 1 tablet (1 mg total) by mouth 2 (two) times daily. 60 tablet 1   No current facility-administered medications on file prior to visit.     BP 109/60 (BP Location: Right Arm, Cuff Size: Normal)   Pulse 69   Temp 98.3 F (36.8 C) (Oral)   Resp 16   Ht 5\' 3"  (1.6 m)   Wt 117 lb 3.2 oz (53.2 kg)   SpO2 97%   BMI 20.76 kg/m       Objective:   Physical Exam  Constitutional: She is oriented to person, place, and time. She appears well-developed and well-nourished. No distress.  Neurological: She is alert and oriented to person, place, and time.  Skin:  Approximately 1 inch diameter area of induration noted at the base of the left buttock.  There is a small amount of fluctuance centrally.  There appears to be an opening with scant drainage noted on her dressing.  There is minimal surrounding erythema.  Psychiatric: She has a normal mood and affect. Her behavior is normal. Thought content normal.           Assessment & Plan:  Skin abscess-per patient report the area was much more red and surrounding the abscess prior to beginning to drain yesterday.  At this point I think we may be able to get her healed with twice daily sitz baths as well as oral antibiotics.  I am not convinced that we absolutely need to drain it today.  I have advised the patient to follow-up in 1 week so that we can reassess.  She is advised to follow-up sooner if increased pain redness or fever occur.  Patient verbalizes understanding.  Will treat with doxycycline.

## 2017-10-22 NOTE — Patient Instructions (Signed)
Please begin doxycycline twice daily. (antibiotic) Call if new/worsening pain/swelling, redness of fever. Soak twice daily for 15 minutes in a warm tub.

## 2017-10-29 ENCOUNTER — Telehealth: Payer: Self-pay | Admitting: Family

## 2017-10-29 ENCOUNTER — Encounter: Payer: Self-pay | Admitting: Family

## 2017-10-29 ENCOUNTER — Ambulatory Visit: Payer: 59 | Admitting: Family

## 2017-10-29 VITALS — BP 102/56 | HR 75 | Temp 98.9°F | Resp 16 | Ht 63.0 in | Wt 116.4 lb

## 2017-10-29 DIAGNOSIS — E0590 Thyrotoxicosis, unspecified without thyrotoxic crisis or storm: Secondary | ICD-10-CM

## 2017-10-29 DIAGNOSIS — L0291 Cutaneous abscess, unspecified: Secondary | ICD-10-CM | POA: Diagnosis not present

## 2017-10-29 LAB — TSH: TSH: 0.15 u[IU]/mL — ABNORMAL LOW (ref 0.35–4.50)

## 2017-10-29 LAB — T3, FREE: T3, Free: 3.1 pg/mL (ref 2.3–4.2)

## 2017-10-29 LAB — T4, FREE: Free T4: 0.79 ng/dL (ref 0.60–1.60)

## 2017-10-29 NOTE — Progress Notes (Signed)
Subjective:    Patient ID: Taylor Atkins, female    DOB: April 11, 1956, 62 y.o.   MRN: [id number]  HPI  Patient is a 62 year old female who presents today for follow-up of the abscess on her left buttock.  Last visit we added doxycycline and recommended twice daily tub soaks. She reports that she just took her last tablet and that the area is improved.   Hyperthyroid-  Lab Results  Component Value Date   TSH 0.26 (L) 02/16/2017   Denies diarrhea, palpitations, weight loss Review of Systems See HPI  Past Medical History:  Diagnosis Date  . Acne   . Anxiety   . Goiter   . Osteopenia   . Skin cancer 6/15  . Thyroid disease   . Tobacco use disorder      Social History   Socioeconomic History  . Marital status: Married    Spouse name: Not on file  . Number of children: Not on file  . Years of education: Not on file  . Highest education level: Not on file  Social Needs  . Financial resource strain: Not on file  . Food insecurity - worry: Not on file  . Food insecurity - inability: Not on file  . Transportation needs - medical: Not on file  . Transportation needs - non-medical: Not on file  Occupational History  . Not on file  Tobacco Use  . Smoking status: Current Every Day Smoker    Years: 40.00    Types: Cigarettes    Last attempt to quit: 01/13/2016    Years since quitting: 1.7  . Smokeless tobacco: Never Used  . Tobacco comment: 5 CIGARETTES DAILY  Substance and Sexual Activity  . Alcohol use: Yes    Alcohol/week: 0.0 oz    Comment: seldom  . Drug use: No  . Sexual activity: Yes  Other Topics Concern  . 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 [place]  . [place]  . [place]  . TUBAL LIGATION  1993    Family  History  Problem Relation Age of Onset  . Hypertension Mother        Afib, living  . Heart disease Father        Afib, pacemaker, PVD  . Hypertension Father   . Diabetes Brother   . Hypertension Brother   . Diabetes Brother   . Hypertension Brother   . Colon cancer Neg Hx     Allergies  Allergen Reactions  . Fosamax [Alendronate Sodium] Other (See Comments)    Extreme nausea    Current Outpatient Medications on File Prior to Visit  Medication Sig Dispense Refill  . Calcium Carbonate-Vitamin D 600-400 MG-UNIT per chew tablet Chew 1 tablet by mouth 2 (two) times daily.    Marland Kitchen doxycycline (VIBRA-TABS) 100 MG tablet Take 1 tablet (100 mg total) by mouth 2 (two) times daily. 14 tablet 0  . escitalopram (LEXAPRO) 20 MG tablet Take 1 tablet (20 mg total) by mouth at bedtime. 90 tablet 0  . meclizine (ANTIVERT) 25 MG tablet Take one tablet every 8 hours as needed for dizziness 30 tablet 0  . meloxicam (MOBIC) 7.5 MG tablet Take 1 tablet (  7.5 mg total) by mouth daily as needed for pain. 90 tablet 1  . traZODone (DESYREL) 50 MG tablet TAKE 1/2 TO 1 TABLET BY  MOUTH AT BEDTIME AS NEEDED  FOR SLEEP 90 tablet 1  . varenicline (CHANTIX CONTINUING MONTH PAK) 1 MG tablet Take 1 tablet (1 mg total) by mouth 2 (two) times daily. 60 tablet 1   No current facility-administered medications on file prior to visit.     BP (!) 102/56 (BP Location: Right Arm, Patient Position: Sitting, Cuff Size: Small)   Pulse 75   Temp 98.9 F (37.2 C) (Oral)   Resp 16   Ht 5\' 3"  (1.6 m)   Wt 116 lb 6.4 oz (52.8 kg)   SpO2 99%   BMI 20.62 kg/m       Objective:   Physical Exam  Constitutional: She is oriented to person, place, and time. She appears well-developed and well-nourished. No distress.  Neurological: She is alert and oriented to person, place, and time.  Skin: Skin is warm and dry.  Resolution of abscess noted on left buttock. Some dry skin remains  Psychiatric: She has a normal mood and affect.  Her behavior is normal. Thought content normal.          Assessment & Plan:  Abscess- resolved. She asked about prevention of further abscesses. Discussed use of dial antibacterial soap.   Hyperthyroid- clinically stable, obtain follow up TFT's.

## 2017-10-29 NOTE — Patient Instructions (Signed)
Please call if recurrent symptoms.

## 2017-10-29 NOTE — Telephone Encounter (Signed)
Lab work still showing overactive thyroid. I have placed referral to endo. Please advise pt.

## 2017-10-29 NOTE — Telephone Encounter (Signed)
Notified pt and she is agreeable to proceed with referral. Pt requests referral be changed to Taylor Atkins. In [place]. Referral placed.

## 2017-11-08 ENCOUNTER — Other Ambulatory Visit: Payer: Self-pay | Admitting: Family

## 2017-11-13 ENCOUNTER — Other Ambulatory Visit: Payer: Self-pay | Admitting: Family

## 2017-11-29 ENCOUNTER — Encounter: Payer: Self-pay | Admitting: Family

## 2017-11-29 ENCOUNTER — Ambulatory Visit (HOSPITAL_BASED_OUTPATIENT_CLINIC_OR_DEPARTMENT_OTHER)
Admission: RE | Admit: 2017-11-29 | Discharge: 2017-11-29 | Disposition: A | Discharge status: Home / Self Care | Payer: 59 | Source: Ambulatory Visit | Attending: Family | Admitting: Family

## 2017-11-29 ENCOUNTER — Telehealth: Payer: Self-pay | Admitting: Family

## 2017-11-29 ENCOUNTER — Ambulatory Visit: Payer: 59 | Admitting: Family

## 2017-11-29 VITALS — BP 123/59 | HR 82 | Temp 99.0°F | Resp 16 | Ht 63.0 in | Wt 116.0 lb

## 2017-11-29 DIAGNOSIS — Z720 Tobacco use: Secondary | ICD-10-CM | POA: Diagnosis not present

## 2017-11-29 DIAGNOSIS — M810 Age-related osteoporosis without current pathological fracture: Secondary | ICD-10-CM | POA: Diagnosis not present

## 2017-11-29 DIAGNOSIS — J449 Chronic obstructive pulmonary disease, unspecified: Secondary | ICD-10-CM | POA: Insufficient documentation

## 2017-11-29 DIAGNOSIS — M818 Other osteoporosis without current pathological fracture: Secondary | ICD-10-CM | POA: Insufficient documentation

## 2017-11-29 DIAGNOSIS — R0781 Pleurodynia: Secondary | ICD-10-CM

## 2017-11-29 NOTE — Telephone Encounter (Signed)
Can we please initiate prolia for Taylor Atkins, dx osteoporosis.

## 2017-11-29 NOTE — Progress Notes (Signed)
Subjective:    Patient ID: Taylor Atkins, female    DOB: 25-Nov-1955, 62 y.o.   MRN: [id number]  HPI  Patient is a 62 yr old female who presents today with chief complaint of left sided rib pain/swelling. Reports that she woke up with this pain on Saturday. Reports pain is worse with deep breathing, lifting the left arm.    Osteoporosis- saw podiatry recently and was diagnosed with a stress fracture in her right foot. Has extreme nausea due to fosamax therefore is not on a bisphosphonate.  Review of Systems See HPI  Past Medical History:  Diagnosis Date  . Acne   . Anxiety   . Goiter   . Osteopenia   . Skin cancer 6/15  . Thyroid disease   . Tobacco use disorder      Social History   Socioeconomic History  . Marital status: Married    Spouse name: Not on file  . Number of children: Not on file  . Years of education: Not on file  . Highest education level: Not on file  Occupational History  . Not on file  Social Needs  . Financial resource strain: Not on file  . Food insecurity:    Worry: Not on file    Inability: Not on file  . Transportation needs:    Medical: Not on file    Non-medical: Not on file  Tobacco Use  . Smoking status: Current Every Day Smoker    Years: 40.00    Types: Cigarettes    Last attempt to quit: 01/13/2016    Years since quitting: 1.8  . Smokeless tobacco: Never Used  . Tobacco comment: 5 CIGARETTES DAILY  Substance and Sexual Activity  . Alcohol use: Yes    Alcohol/week: 0.0 oz    Comment: seldom  . Drug use: No  . Sexual activity: Yes  Lifestyle  . Physical activity:    Days per week: Not on file    Minutes per session: Not on file  . Stress: Not on file  Relationships  . Social connections:    Talks on phone: Not on file    Gets together: Not on file    Attends religious service: Not on file    Active member of club or organization: Not on file    Attends meetings of clubs or organizations: Not on file    Relationship  status: Not on file  . Intimate partner violence:    Fear of current or ex partner: Not on file    Emotionally abused: Not on file    Physically abused: Not on file    Forced sexual activity: Not on file  Other Topics Concern  . 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 [place]  . [place]  . [place]  . TUBAL LIGATION  1993    Family History  Problem Relation Age of Onset  . Hypertension Mother        Afib, living  . Heart disease Father        Afib, pacemaker, PVD  . Hypertension Father   . Diabetes Brother   . Hypertension Brother   . Diabetes Brother   .  Hypertension Brother   . Colon cancer Neg Hx     Allergies  Allergen Reactions  . Fosamax [Alendronate Sodium] Other (See Comments)    Extreme nausea    Current Outpatient Medications on File Prior to Visit  Medication Sig Dispense Refill  . Calcium Carbonate-Vitamin D 600-400 MG-UNIT per chew tablet Chew 1 tablet by mouth 2 (two) times daily.    Marland Kitchen escitalopram (LEXAPRO) 20 MG tablet TAKE 1 TABLET BY MOUTH AT  BEDTIME 90 tablet 1  . meclizine (ANTIVERT) 25 MG tablet Take one tablet every 8 hours as needed for dizziness 30 tablet 0  . meloxicam (MOBIC) 7.5 MG tablet TAKE 1 TABLET BY MOUTH  DAILY AS NEEDED FOR PAIN 90 tablet 1  . traZODone (DESYREL) 50 MG tablet TAKE 1/2 TO 1 TABLET BY  MOUTH AT BEDTIME AS NEEDED  FOR SLEEP 90 tablet 1  . doxycycline (VIBRA-TABS) 100 MG tablet Take 1 tablet (100 mg total) by mouth 2 (two) times daily. (Patient not taking: Reported on 11/29/2017) 14 tablet 0  . varenicline (CHANTIX CONTINUING MONTH PAK) 1 MG tablet Take 1 tablet (1 mg total) by mouth 2 (two) times daily. (Patient not taking: Reported on 11/29/2017) 60 tablet 1   No current  facility-administered medications on file prior to visit.     BP (!) 123/59 (BP Location: Right Arm, Patient Position: Sitting, Cuff Size: Small)   Pulse 82   Temp 99 F (37.2 C) (Oral)   Resp 16   Ht 5\' 3"  (1.6 m)   Wt 116 lb (52.6 kg)   SpO2 98%   BMI 20.55 kg/m       Objective:   Physical Exam  Constitutional: She is oriented to person, place, and time. She appears well-developed and well-nourished.  Cardiovascular: Normal rate, regular rhythm and normal heart sounds.  No murmur heard. Pulmonary/Chest: Effort normal. No respiratory distress. She has wheezes in the right middle field and the right lower field.  Musculoskeletal: She exhibits no edema.  Tenderness overlying left lateral rib cage  Neurological: She is alert and oriented to person, place, and time.  Psychiatric: She has a normal mood and affect. Her behavior is normal. Judgment and thought content normal.          Assessment & Plan:  Left rib pain- will obtain left rib xray and cxr due to wheezing.  Tobacco abuse- still smoking, not ready to quit.  Osteoporosis- had recent stress fracture on her foot, ? Rib fracture as well. She is intolerant to fosamax due to GI symptoms. Will initiate prolia, she is agreeable. Will also order a follow up bone density.

## 2017-11-29 NOTE — Patient Instructions (Addendum)
Please complete x-ray on the first floor.  

## 2017-11-30 ENCOUNTER — Encounter: Payer: Self-pay | Admitting: Family

## 2017-12-01 NOTE — Telephone Encounter (Signed)
Message sent to [place].

## 2017-12-11 ENCOUNTER — Other Ambulatory Visit: Payer: Self-pay | Admitting: Family

## 2017-12-14 NOTE — Telephone Encounter (Signed)
Prolia benefits received PA not required $3500 deductible 30% co-insurance  **Prolia should be called to pharmacy to check benefits due to high deductible. She may also qualify for [employer] copay card to help pay at pharmacy

## 2017-12-15 ENCOUNTER — Encounter: Payer: Self-pay | Admitting: Family

## 2017-12-15 NOTE — Telephone Encounter (Signed)
Notified pt of below. Also provided pt with # to see if she qualifies for assistance [phone]).  Pt will call us back with outcome and will let us know if she wants Rx sent to pharmacy or if she will be able to get via manufacturer.

## 2017-12-17 ENCOUNTER — Telehealth: Payer: Self-pay

## 2017-12-17 ENCOUNTER — Telehealth: Payer: Self-pay | Admitting: *Deleted

## 2017-12-17 MED ORDER — DENOSUMAB 60 MG/ML ~~LOC~~ SOLN: 60.0000 mg | Freq: Once | SUBCUTANEOUS | 0 refills | Status: AC

## 2017-12-17 NOTE — Telephone Encounter (Signed)
Please advise pt that I would recommend d/c of trazodone and she could restart restoril or she could use melatonin instead. Would need controlled substance contract if we restart restoril.

## 2017-12-17 NOTE — Telephone Encounter (Signed)
See 12/15/17 patient email.

## 2017-12-17 NOTE — Telephone Encounter (Signed)
Left message for pt to return my call.  Also, please see 12/15/17 patient email.

## 2017-12-17 NOTE — Telephone Encounter (Signed)
PA denied. Plan exclusion.   Pt has co-pay card from manufacturer to help w/ cost of medication.

## 2017-12-17 NOTE — Telephone Encounter (Signed)
PA initiated via Covermymeds; KEY: [id number]. Awaiting determination.

## 2017-12-17 NOTE — Telephone Encounter (Signed)
Received fax from [employer] wanting to make [place] aware of that combination of Trazodone and Escitalopram may cause QT prolongation and risk of serotonin syndrome. Form placed in PCP folder for recommendation.

## 2017-12-22 NOTE — Telephone Encounter (Signed)
Ok, d/c trazadone.

## 2017-12-22 NOTE — Telephone Encounter (Signed)
Notified pt of below. She states that she has tried melatonin in the past and it did not help. She is not interested in restarting a controlled substance for sleep. She will let us know if she changes her mind in the future. Please advise if any other recommendations?

## 2017-12-22 NOTE — Telephone Encounter (Signed)
D/c trazadone.

## 2017-12-23 ENCOUNTER — Encounter: Payer: Self-pay | Admitting: Family

## 2017-12-25 MED ORDER — ESCITALOPRAM OXALATE 10 MG PO TABS: ORAL_TABLET | ORAL | 0 refills | Status: DC

## 2017-12-25 NOTE — Telephone Encounter (Signed)
Let's have her decrease lexapro to 10mg  once daily for 1 week then 1/2 tab once daily for 1 week, then 1/2 tab every other day for 1 week then stop.

## 2017-12-27 ENCOUNTER — Encounter: Payer: Self-pay | Admitting: Family

## 2017-12-27 MED ORDER — TRAZODONE HCL 50 MG PO TABS: 25.0000 mg | ORAL_TABLET | Freq: Every evening | ORAL | 0 refills | Status: DC | PRN

## 2017-12-29 ENCOUNTER — Telehealth: Payer: Self-pay | Admitting: Family

## 2017-12-29 NOTE — Telephone Encounter (Signed)
Copied from [place]. Topic: Quick Communication - See Telephone Encounter >> Dec 29, 2017 11:08 AM Rosalin Hawking wrote: CRM for notification. See Telephone encounter for: 12/29/17.    Pt dropped off document to be filled out by provider Neldon [place]- Patient Application - 4 pages). Pt would like to have document faxed to [phone] when document ready. Document put at front office tray under providers name.

## 2017-12-29 NOTE — Telephone Encounter (Signed)
Per patient she is stopping the Lexapro and will stay on the Trazadone.

## 2018-01-07 NOTE — Telephone Encounter (Signed)
Paperwork forwarded to provider/SLS 05/03

## 2018-01-13 NOTE — Telephone Encounter (Signed)
Received completed paperwork back from provider; faxed to [employer]/SLS 05/09

## 2018-01-19 MED FILL — methIMAzole 5 MG TABS: 5 | 30 days supply | Qty: 30 | Fill #0

## 2018-02-02 NOTE — Telephone Encounter (Signed)
Received Denial from [employer]. Patient is not currently eligible because: "Your patient has Insurance plan indicated that they have insurance coverage for their medication requested". Will forwarded to [place] for further determination [see past phones RE: Prolia]/SLS 05/29

## 2018-02-14 ENCOUNTER — Other Ambulatory Visit: Payer: Self-pay | Admitting: Family

## 2018-02-15 MED ORDER — TRAZODONE HCL 50 MG PO TABS: 25.0000 mg | ORAL_TABLET | Freq: Every evening | ORAL | 1 refills | Status: DC | PRN

## 2018-03-03 ENCOUNTER — Ambulatory Visit: Payer: 59 | Admitting: Family

## 2018-03-03 ENCOUNTER — Encounter: Payer: Self-pay | Admitting: Family

## 2018-03-03 VITALS — BP 91/58 | HR 67 | Temp 98.6°F | Resp 16 | Ht 63.0 in | Wt 114.4 lb

## 2018-03-03 DIAGNOSIS — Z720 Tobacco use: Secondary | ICD-10-CM | POA: Diagnosis not present

## 2018-03-03 DIAGNOSIS — E0590 Thyrotoxicosis, unspecified without thyrotoxic crisis or storm: Secondary | ICD-10-CM | POA: Diagnosis not present

## 2018-03-03 DIAGNOSIS — M810 Age-related osteoporosis without current pathological fracture: Secondary | ICD-10-CM | POA: Diagnosis not present

## 2018-03-03 DIAGNOSIS — F419 Anxiety disorder, unspecified: Secondary | ICD-10-CM | POA: Diagnosis not present

## 2018-03-03 DIAGNOSIS — L709 Acne, unspecified: Secondary | ICD-10-CM | POA: Diagnosis not present

## 2018-03-03 MED ORDER — IBANDRONATE SODIUM 150 MG PO TABS: 150.0000 mg | ORAL_TABLET | ORAL | 4 refills | Status: DC

## 2018-03-03 MED ORDER — BENZOYL PEROXIDE-ERYTHROMYCIN 5-3 % EX GEL: Freq: Two times a day (BID) | CUTANEOUS | 2 refills | Status: DC

## 2018-03-03 NOTE — Progress Notes (Signed)
Subjective:    Patient ID: Taylor Atkins, female    DOB: [birth year], 62 y.o.   MRN: [id number]  HPI  Patient is a 62 year old female who presents today for 24-month follow-up.  Osteoporosis-patient had  nausea with oral bisphosphonates.  We attempted to get Prolia authorization and it was declined by her insurance.  She reports that she had nausea for several hours after dose of Fosamax.  Tobacco abuse-patient was given trial of Chantix.  Anxiety- since last visit she has weaned herself off of Lexapro.  Hyperthyroid-reports that she was recently started on tapazole. She continues to follow with Dr. Iran Planas. Lab Results  Component Value Date   TSH 0.15 (L) 10/29/2017     Review of Systems See HPI  Past Medical History:  Diagnosis Date  . Acne   . Anxiety   . Goiter   . Osteopenia   . Skin cancer 6/15  . Thyroid disease   . Tobacco use disorder      Social History   Socioeconomic History  . Marital status: Married    Spouse name: Not on file  . Number of children: Not on file  . Years of education: Not on file  . Highest education level: Not on file  Occupational History  . Not on file  Social Needs  . Financial resource strain: Not on file  . Food insecurity:    Worry: Not on file    Inability: Not on file  . Transportation needs:    Medical: Not on file    Non-medical: Not on file  Tobacco Use  . Smoking status: Current Every Day Smoker    Years: 40.00    Types: Cigarettes    Last attempt to quit: 01/13/2016    Years since quitting: 2.1  . Smokeless tobacco: Never Used  . Tobacco comment: 5 CIGARETTES DAILY  Substance and Sexual Activity  . Alcohol use: Yes    Alcohol/week: 0.0 oz    Comment: seldom  . Drug use: No  . Sexual activity: Yes  Lifestyle  . Physical activity:    Days per week: Not on file    Minutes per session: Not on file  . Stress: Not on file  Relationships  . Social connections:    Talks on phone: Not on file   Gets together: Not on file    Attends religious service: Not on file    Active member of club or organization: Not on file    Attends meetings of clubs or organizations: Not on file    Relationship status: Not on file  . Intimate partner violence:    Fear of current or ex partner: Not on file    Emotionally abused: Not on file    Physically abused: Not on file    Forced sexual activity: Not on file  Other Topics Concern  . 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 [place]  . [place]  . [place]  . TUBAL LIGATION  1993    Family History  Problem Relation Age of Onset  . Hypertension Mother        Afib, living  . Heart disease Father  Afib, pacemaker, PVD  . Hypertension Father   . Diabetes Brother   . Hypertension Brother   . Diabetes Brother   . Hypertension Brother   . Colon cancer Neg Hx     Allergies  Allergen Reactions  . Fosamax [Alendronate Sodium] Other (See Comments)    Extreme nausea    Current Outpatient Medications on File Prior to Visit  Medication Sig Dispense Refill  . Calcium Carbonate-Vitamin D 600-400 MG-UNIT per chew tablet Chew 1 tablet by mouth 2 (two) times daily.    . meclizine (ANTIVERT) 25 MG tablet Take one tablet every 8 hours as needed for dizziness 30 tablet 0  . meloxicam (MOBIC) 7.5 MG tablet TAKE 1 TABLET BY MOUTH  DAILY AS NEEDED FOR PAIN 90 tablet 1  . methimazole (TAPAZOLE) 5 MG tablet Take one tab po daily    . traZODone (DESYREL) 50 MG tablet Take 0.5-1 tablets (25-50 mg total) by mouth at bedtime as needed. for sleep 90 tablet 1  . varenicline (CHANTIX CONTINUING MONTH PAK) 1 MG tablet Take 1 tablet (1 mg total) by mouth 2 (two) times daily. 60 tablet 1   No current  facility-administered medications on file prior to visit.     BP (!) 91/58 (BP Location: Right Arm, Patient Position: Sitting, Cuff Size: Small)   Pulse 67   Temp 98.6 F (37 C) (Oral)   Resp 16   Ht 5\' 3"  (1.6 m)   Wt 114 lb 6.4 oz (51.9 kg)   SpO2 95%   BMI 20.27 kg/m       Objective:   Physical Exam  Constitutional: She appears well-developed and well-nourished.  Cardiovascular: Normal rate, regular rhythm and normal heart sounds.  No murmur heard. Pulmonary/Chest: Effort normal and breath sounds normal. No respiratory distress. She has no wheezes.  Skin:  Small subcutaneous skin cyst noted left lower cheek  Psychiatric: She has a normal mood and affect. Her behavior is normal. Judgment and thought content normal.          Assessment & Plan:  Tobacco abuse-not currently ready to quit smoking.  She has Chantix on hand which she plans to begin once she  is ready.  Acne- a prescription was sent for Benzamycin gel.  Hyperthyroid-clinically stable.  This is being managed by endocrinology.  Anxiety-reports this is stable.  Does not wish to try any other treatment at this time.  Continue off of Lexapro.  Osteoporosis- we discussed possibility of Reclast.  She is interested in potentially trying Boniva first.  Since this is only monthly she will not have to suffer with the side effect of the nausea as frequently.  We will give her a trial of this she will let me know if nausea is severe or if she has any other issues.

## 2018-03-03 NOTE — Addendum Note (Signed)
Addended by: Jiles Prows on: 03/03/2018 01:55 PM   Modules accepted: Orders

## 2018-03-03 NOTE — Patient Instructions (Signed)
Please begin boniva once monthly. Sit upright for 90 minutes after taking. Let me know if nausea is bothersome to you. Start chantix when you are ready. You may use benzamycin gel 1-2 times daily.

## 2018-04-14 ENCOUNTER — Other Ambulatory Visit: Payer: Self-pay | Admitting: Family

## 2018-04-18 NOTE — Telephone Encounter (Signed)
Received refill request for meloxicam (MOBIC) 7.5 MG tablet.  Last OV: 03/03/18 Last RF: 11/09/17  Please advise.

## 2018-04-20 ENCOUNTER — Other Ambulatory Visit: Payer: Self-pay | Admitting: Family

## 2018-06-07 ENCOUNTER — Encounter: Payer: 59 | Admitting: Family

## 2018-06-08 ENCOUNTER — Other Ambulatory Visit (HOSPITAL_COMMUNITY)
Admission: RE | Admit: 2018-06-08 | Discharge: 2018-06-08 | Disposition: A | Discharge status: Home / Self Care | Payer: 59 | Source: Ambulatory Visit | Attending: Family | Admitting: Family

## 2018-06-08 ENCOUNTER — Encounter: Payer: Self-pay | Admitting: Family

## 2018-06-08 ENCOUNTER — Ambulatory Visit (INDEPENDENT_AMBULATORY_CARE_PROVIDER_SITE_OTHER): Payer: 59 | Admitting: Family

## 2018-06-08 VITALS — BP 130/56 | HR 94 | Temp 98.5°F | Resp 16 | Ht 63.0 in | Wt 118.0 lb

## 2018-06-08 DIAGNOSIS — Z01419 Encounter for gynecological examination (general) (routine) without abnormal findings: Secondary | ICD-10-CM

## 2018-06-08 DIAGNOSIS — R1011 Right upper quadrant pain: Secondary | ICD-10-CM

## 2018-06-08 DIAGNOSIS — Z0000 Encounter for general adult medical examination without abnormal findings: Secondary | ICD-10-CM

## 2018-06-08 DIAGNOSIS — Z23 Encounter for immunization: Secondary | ICD-10-CM

## 2018-06-08 LAB — LIPID PANEL
CHOL/HDL RATIO: 3
CHOLESTEROL: 166 mg/dL (ref 0–200)
HDL: 57.1 mg/dL (ref >39.00)
LDL CALC: 98 mg/dL (ref 0–99)
NONHDL: 108.96
TRIGLYCERIDES: 56 mg/dL (ref 0.0–149.0)
VLDL: 11.2 mg/dL (ref 0.0–40.0)

## 2018-06-08 LAB — CBC WITH DIFFERENTIAL/PLATELET
BASOS PCT: 0.6 % (ref 0.0–3.0)
Basophils Absolute: 0 10*3/uL (ref 0.0–0.1)
EOS PCT: 1.5 % (ref 0.0–5.0)
Eosinophils Absolute: 0.1 10*3/uL (ref 0.0–0.7)
HEMATOCRIT: 42 % (ref 36.0–46.0)
Hemoglobin: 14.2 g/dL (ref 12.0–15.0)
LYMPHS PCT: 30.9 % (ref 12.0–46.0)
Lymphs Abs: 2.5 10*3/uL (ref 0.7–4.0)
MCHC: 33.9 g/dL (ref 30.0–36.0)
MCV: 89.5 fl (ref 78.0–100.0)
MONOS PCT: 7.4 % (ref 3.0–12.0)
Monocytes Absolute: 0.6 10*3/uL (ref 0.1–1.0)
NEUTROS ABS: 4.8 10*3/uL (ref 1.4–7.7)
Neutrophils Relative %: 59.6 % (ref 43.0–77.0)
PLATELETS: 222 10*3/uL (ref 150.0–400.0)
RBC: 4.7 Mil/uL (ref 3.87–5.11)
RDW: 13.6 % (ref 11.5–15.5)
WBC: 8 10*3/uL (ref 4.0–10.5)

## 2018-06-08 LAB — URINALYSIS, ROUTINE W REFLEX MICROSCOPIC
Bilirubin Urine: NEGATIVE
KETONES UR: NEGATIVE
Leukocytes, UA: NEGATIVE
Nitrite: NEGATIVE
Total Protein, Urine: NEGATIVE
URINE GLUCOSE: NEGATIVE
UROBILINOGEN UA: 0.2 (ref 0.0–1.0)
pH: 7 (ref 5.0–8.0)

## 2018-06-08 LAB — BASIC METABOLIC PANEL
BUN: 13 mg/dL (ref 6–23)
CHLORIDE: 104 meq/L (ref 96–112)
CO2: 31 meq/L (ref 19–32)
CREATININE: 0.83 mg/dL (ref 0.40–1.20)
Calcium: 9.5 mg/dL (ref 8.4–10.5)
GFR: 73.43 mL/min (ref >60.00)
Glucose, Bld: 91 mg/dL (ref 70–99)
POTASSIUM: 4.2 meq/L (ref 3.5–5.1)
SODIUM: 142 meq/L (ref 135–145)

## 2018-06-08 LAB — HEPATIC FUNCTION PANEL
ALBUMIN: 4.6 g/dL (ref 3.5–5.2)
ALT: 11 U/L (ref 0–35)
AST: 17 U/L (ref 0–37)
Alkaline Phosphatase: 69 U/L (ref 39–117)
Bilirubin, Direct: 0.1 mg/dL (ref 0.0–0.3)
TOTAL PROTEIN: 6.8 g/dL (ref 6.0–8.3)
Total Bilirubin: 0.4 mg/dL (ref 0.2–1.2)

## 2018-06-08 LAB — TSH: TSH: 0.33 u[IU]/mL — ABNORMAL LOW (ref 0.35–4.50)

## 2018-06-08 NOTE — Progress Notes (Signed)
Subjective:    Patient ID: Taylor Atkins, female    DOB: 04/21/1956, 62 y.o.   MRN: [id number]  HPI  Patient presents today for complete physical.  Immunizations: would like shingrix #1 today. Tetanus up to date. Declines flu shot Diet: healthy Exercise:  No formal exercise.   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  Mammogram: due Dental: up to vision Vision: up to date Wt Readings from Last 3 Encounters:  06/08/18 118 lb (53.5 kg)  03/03/18 114 lb 6.4 oz (51.9 kg)  11/29/17 116 lb (52.6 kg)     Review of Systems  Constitutional: Negative for unexpected weight change.  HENT: Negative for hearing loss and rhinorrhea.   Eyes: Negative for visual disturbance.  Respiratory: Negative for cough.   Cardiovascular: Negative for leg swelling.  Gastrointestinal: Negative for diarrhea.       Occasional constipation  Genitourinary: Negative for dysuria and frequency.  Musculoskeletal: Negative for arthralgias and myalgias.  Neurological: Negative for headaches.  Hematological: Negative for adenopathy.  Psychiatric/Behavioral:       Denies depression Some anxiety related to her mother.    Past Medical History:  Diagnosis Date  . Acne   . Anxiety   . Goiter   . Osteopenia   . Skin cancer 6/15  . Thyroid disease   . Tobacco use disorder      Social History   Socioeconomic History  . Marital status: Married    Spouse name: Not on file  . Number of children: Not on file  . Years of education: Not on file  . Highest education level: Not on file  Occupational History  . Not on file  Social Needs  . Financial resource strain: Not on file  . Food insecurity:    Worry: Not on file    Inability: Not on file  . Transportation needs:    Medical: Not on file    Non-medical: Not on file  Tobacco Use  . Smoking status: Current Every Day Smoker    Years: 40.00    Types: Cigarettes    Last  attempt to quit: 01/13/2016    Years since quitting: 2.4  . Smokeless tobacco: Never Used  . Tobacco comment: 5 CIGARETTES DAILY  Substance and Sexual Activity  . Alcohol use: Yes    Alcohol/week: 0.0 standard drinks    Comment: seldom  . Drug use: No  . Sexual activity: Yes  Lifestyle  . Physical activity:    Days per week: Not on file    Minutes per session: Not on file  . Stress: Not on file  Relationships  . Social connections:    Talks on phone: Not on file    Gets together: Not on file    Attends religious service: Not on file    Active member of club or organization: Not on file    Attends meetings of clubs or organizations: Not on file    Relationship status: Not on file  . Intimate partner violence:    Fear of current or ex partner: Not on file    Emotionally abused: Not on file    Physically abused: Not on file    Forced sexual activity: Not on file  Other Topics Concern  . 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 [place]  . [place]  . [place]  . TUBAL LIGATION  1993    Family History  Problem Relation Age of Onset  . Hypertension Mother        Afib, living  . Heart disease Father        Afib, pacemaker, PVD  . Hypertension Father   . Diabetes Brother   . Hypertension Brother   . Diabetes Brother   . Hypertension Brother   . Colon cancer Neg Hx     Allergies  Allergen Reactions  . Fosamax [Alendronate Sodium] Other (See Comments)    Extreme nausea    Current Outpatient Medications on File Prior to Visit  Medication Sig Dispense Refill  . benzoyl peroxide-erythromycin (BENZAMYCIN) gel Apply topically 2 (two) times daily. 23.3 g 2  . Calcium Carbonate-Vitamin D 600-400 MG-UNIT per chew tablet Chew 1 tablet by  mouth 2 (two) times daily.    Marland Kitchen ibandronate (BONIVA) 150 MG tablet Take 1 tablet (150 mg total) by mouth every 30 (thirty) days. Take in the morning with a full glass of water, on an empty stomach, and do not take anything else by mouth or lie down for the next 30 min. 3 tablet 4  . meclizine (ANTIVERT) 25 MG tablet Take one tablet every 8 hours as needed for dizziness 30 tablet 0  . meloxicam (MOBIC) 7.5 MG tablet TAKE 1 TABLET BY MOUTH  DAILY AS NEEDED FOR PAIN 90 tablet 1  . traZODone (DESYREL) 50 MG tablet Take 0.5-1 tablets (25-50 mg total) by mouth at bedtime as needed. for sleep 90 tablet 1   No current facility-administered medications on file prior to visit.     BP (!) 130/56 (BP Location: Right Arm, Patient Position: Sitting, Cuff Size: Small)   Pulse 94   Temp 98.5 F (36.9 C) (Oral)   Resp 16   Ht 5\' 3"  (1.6 m)   Wt 118 lb (53.5 kg)   SpO2 97%   BMI 20.90 kg/m        Objective:   Physical Exam Physical Exam  Constitutional: She is oriented to person, place, and time. She appears well-developed and well-nourished. No distress.  HENT:  Head: Normocephalic and atraumatic.  Right Ear: Tympanic membrane and ear canal normal.  Left Ear: Tympanic membrane and ear canal normal.  Mouth/Throat: Oropharynx is clear and moist.  Eyes: Pupils are equal, round, and reactive to light. No scleral icterus.  Neck: Normal range of motion. No thyromegaly present.  Cardiovascular: Normal rate and regular rhythm.   No murmur heard. Pulmonary/Chest: Effort normal and breath sounds normal. No respiratory distress. He has no wheezes. She has no rales. She exhibits no tenderness.  Abdominal: Soft. Bowel sounds are normal. She exhibits no distension and no mass. There is no tenderness. There is no rebound and no guarding.  Musculoskeletal: She exhibits no edema.  Lymphadenopathy:    She has no cervical adenopathy.  Neurological: She is alert and oriented to person, place, and time. She has  normal patellar reflexes. She exhibits normal muscle tone. Coordination normal.  Skin: Skin is warm and dry.  Psychiatric: She has a normal mood and affect. Her behavior is normal. Judgment and thought content normal.  Breasts: Examined lying Right: Without masses, retractions, discharge or axillary adenopathy.  Left: Without masses,  retractions, discharge or axillary adenopathy.  Inguinal/mons: Normal without inguinal adenopathy  External genitalia: Normal  BUS/Urethra/Skene's glands: Normal  Bladder: Normal  Vagina: atrophic Cervix: Normal  Uterus: normal in size, shape and contour. Midline and mobile  Adnexa/parametria:  Rt: Without masses or tenderness.  Lt: Without masses or tenderness.  Anus and perineum: Normal            Assessment & Plan:  Preventative care- dicussed healthy diet/exercise, importance of smoking cessation. Declines flu shot tetanus up to date.  Shingrix #1 today. Pap today.   RUQ discomfort- Notes intermittent abdominal bulging/discomfort RUQ. No obvious hernia. Obtain US for further evaluation        Assessment & Plan:

## 2018-06-08 NOTE — Patient Instructions (Addendum)
Please complete lab work prior to leaving. You will be contacted about scheduling your US/Mammogram. It is important that you quit smoking.

## 2018-06-09 ENCOUNTER — Telehealth: Payer: Self-pay | Admitting: Family

## 2018-06-09 ENCOUNTER — Ambulatory Visit (HOSPITAL_BASED_OUTPATIENT_CLINIC_OR_DEPARTMENT_OTHER)
Admission: RE | Admit: 2018-06-09 | Discharge: 2018-06-09 | Disposition: A | Discharge status: Home / Self Care | Payer: 59 | Source: Ambulatory Visit | Attending: Family | Admitting: Family

## 2018-06-09 ENCOUNTER — Encounter (HOSPITAL_BASED_OUTPATIENT_CLINIC_OR_DEPARTMENT_OTHER): Payer: Self-pay | Admitting: Radiology

## 2018-06-09 DIAGNOSIS — R932 Abnormal findings on diagnostic imaging of liver and biliary tract: Secondary | ICD-10-CM | POA: Diagnosis not present

## 2018-06-09 DIAGNOSIS — K828 Other specified diseases of gallbladder: Secondary | ICD-10-CM | POA: Insufficient documentation

## 2018-06-09 DIAGNOSIS — R3129 Other microscopic hematuria: Secondary | ICD-10-CM | POA: Insufficient documentation

## 2018-06-09 DIAGNOSIS — N281 Cyst of kidney, acquired: Secondary | ICD-10-CM | POA: Diagnosis not present

## 2018-06-09 DIAGNOSIS — R1011 Right upper quadrant pain: Secondary | ICD-10-CM | POA: Insufficient documentation

## 2018-06-09 NOTE — Telephone Encounter (Signed)
Please let pt know that her urine shows microscopic blood.  I would like her to see urology for further evaluation. Thyroid testing is stable, still a bit overactive. Continue to follow with Dr. Tamala Julian.

## 2018-06-10 LAB — CYTOLOGY - PAP: HPV: NOT DETECTED

## 2018-06-10 NOTE — Telephone Encounter (Signed)
Patient advised of results, she reports she is being managed by urology, and she will follow up with Dr. Tamala Julian.

## 2018-06-13 ENCOUNTER — Telehealth: Payer: Self-pay | Admitting: Family

## 2018-06-13 DIAGNOSIS — N281 Cyst of kidney, acquired: Secondary | ICD-10-CM

## 2018-06-13 NOTE — Telephone Encounter (Signed)
Talked to patient and she will like to proceed with MRI, she said she used to see Urologist in [place] but will like to see someone in [place] if she needs to.  Order for MRI signed for scheduling.

## 2018-06-13 NOTE — Telephone Encounter (Signed)
US shows a right kidney cyst.  I would like her to complete an MRI to further evaluate the cyst.  Which urology group is she following with?   I have pended the mri order.

## 2018-06-13 NOTE — Telephone Encounter (Signed)
[facility]- Gwen- could you please send to Urology in [place] instead at pt's request?

## 2018-06-16 ENCOUNTER — Ambulatory Visit (HOSPITAL_BASED_OUTPATIENT_CLINIC_OR_DEPARTMENT_OTHER)
Admission: RE | Admit: 2018-06-16 | Discharge: 2018-06-16 | Disposition: A | Discharge status: Home / Self Care | Payer: 59 | Source: Ambulatory Visit | Attending: Family | Admitting: Family

## 2018-06-16 DIAGNOSIS — Z0000 Encounter for general adult medical examination without abnormal findings: Secondary | ICD-10-CM | POA: Diagnosis present

## 2018-06-23 ENCOUNTER — Encounter: Payer: Self-pay | Admitting: Family

## 2018-06-25 ENCOUNTER — Ambulatory Visit (HOSPITAL_BASED_OUTPATIENT_CLINIC_OR_DEPARTMENT_OTHER): Payer: 59

## 2018-07-05 ENCOUNTER — Other Ambulatory Visit: Payer: Self-pay | Admitting: Family

## 2018-08-08 ENCOUNTER — Encounter: Payer: Self-pay | Admitting: Family

## 2018-08-09 ENCOUNTER — Ambulatory Visit (INDEPENDENT_AMBULATORY_CARE_PROVIDER_SITE_OTHER): Payer: 59

## 2018-08-09 DIAGNOSIS — Z23 Encounter for immunization: Secondary | ICD-10-CM

## 2018-09-05 ENCOUNTER — Other Ambulatory Visit: Payer: Self-pay | Admitting: Family

## 2018-10-30 ENCOUNTER — Encounter: Payer: Self-pay | Admitting: Family

## 2018-11-08 MED ORDER — VARENICLINE TARTRATE 0.5 MG X 11 & 1 MG X 42 PO MISC: ORAL | 0 refills | Status: DC

## 2018-11-09 ENCOUNTER — Telehealth: Payer: Self-pay

## 2018-11-09 ENCOUNTER — Ambulatory Visit (HOSPITAL_BASED_OUTPATIENT_CLINIC_OR_DEPARTMENT_OTHER)
Admission: RE | Admit: 2018-11-09 | Discharge: 2018-11-09 | Disposition: A | Discharge status: Home / Self Care | Payer: Medicare HMO | Source: Ambulatory Visit | Attending: Family | Admitting: Family

## 2018-11-09 ENCOUNTER — Encounter: Payer: Self-pay | Admitting: Family

## 2018-11-09 ENCOUNTER — Ambulatory Visit (INDEPENDENT_AMBULATORY_CARE_PROVIDER_SITE_OTHER): Payer: Medicare HMO | Admitting: Family

## 2018-11-09 VITALS — BP 114/53 | HR 74 | Temp 98.7°F | Resp 16 | Ht 63.0 in | Wt 118.6 lb

## 2018-11-09 DIAGNOSIS — N281 Cyst of kidney, acquired: Secondary | ICD-10-CM

## 2018-11-09 DIAGNOSIS — Z720 Tobacco use: Secondary | ICD-10-CM | POA: Diagnosis not present

## 2018-11-09 DIAGNOSIS — R059 Cough, unspecified: Secondary | ICD-10-CM

## 2018-11-09 DIAGNOSIS — R0602 Shortness of breath: Secondary | ICD-10-CM | POA: Diagnosis not present

## 2018-11-09 DIAGNOSIS — R05 Cough: Secondary | ICD-10-CM | POA: Insufficient documentation

## 2018-11-09 DIAGNOSIS — F329 Major depressive disorder, single episode, unspecified: Secondary | ICD-10-CM

## 2018-11-09 DIAGNOSIS — R69 Illness, unspecified: Secondary | ICD-10-CM | POA: Diagnosis not present

## 2018-11-09 DIAGNOSIS — F32A Depression, unspecified: Secondary | ICD-10-CM

## 2018-11-09 MED ORDER — BUPROPION HCL ER (XL) 150 MG PO TB24: 150.0000 mg | ORAL_TABLET | Freq: Every day | ORAL | 1 refills | Status: DC

## 2018-11-09 NOTE — Progress Notes (Signed)
Subjective:    Patient ID: Taylor Atkins, female    DOB: 06-09-1956, 63 y.o.   MRN: [id number]  HPI  Patient is a 63 yr old female with hx of tobacco abuse x 40 yrs who presents today with chief complaint of cough. Reports that cough has been present x 3 months initially started with a URI.  Cold symptoms resolved but cough persisted. Cough is productive.  Denies fever.  + Wheezing. Occasional SOB.   Notes stress caring for her mother. Reports no motivation to do anything.  Feels like she does not have a life.  Reports that it takes all of her energy just to motivating to help her mother bathe.  Concerned that she is depressed.  Review of Systems See HPI  Past Medical History:  Diagnosis Date  . Acne   . Anxiety   . Goiter   . Osteopenia   . Skin cancer 6/15  . Thyroid disease   . Tobacco use disorder      Social History   Socioeconomic History  . Marital status: Married    Spouse name: Not on file  . Number of children: Not on file  . Years of education: Not on file  . Highest education level: Not on file  Occupational History  . Not on file  Social Needs  . Financial resource strain: Not on file  . Food insecurity:    Worry: Not on file    Inability: Not on file  . Transportation needs:    Medical: Not on file    Non-medical: Not on file  Tobacco Use  . Smoking status: Current Every Day Smoker    Years: 40.00    Types: Cigarettes    Last attempt to quit: 01/13/2016    Years since quitting: 2.8  . Smokeless tobacco: Never Used  . Tobacco comment: 5 CIGARETTES DAILY  Substance and Sexual Activity  . Alcohol use: Yes    Alcohol/week: 0.0 standard drinks    Comment: seldom  . Drug use: No  . Sexual activity: Yes  Lifestyle  . Physical activity:    Days per week: Not on file    Minutes per session: Not on file  . Stress: Not on file  Relationships  . Social connections:    Talks on phone: Not on file    Gets together: Not on file    Attends religious  service: Not on file    Active member of club or organization: Not on file    Attends meetings of clubs or organizations: Not on file    Relationship status: Not on file  . Intimate partner violence:    Fear of current or ex partner: Not on file    Emotionally abused: Not on file    Physically abused: Not on file    Forced sexual activity: Not on file  Other Topics Concern  . 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 [place]  . [place]  . EXCISION MASS UPPER EXTREMETIES     [place]  . [place]  . TUBAL LIGATION  1993    Family History  Problem Relation Age of Onset  . Hypertension Mother  Afib, living  . Heart disease Father        Afib, pacemaker, PVD  . Hypertension Father   . Diabetes Brother   . Hypertension Brother   . Diabetes Brother   . Hypertension Brother   . Colon cancer Neg Hx     Allergies  Allergen Reactions  . Fosamax [Alendronate Sodium] Other (See Comments)    Extreme nausea    Current Outpatient Medications on File Prior to Visit  Medication Sig Dispense Refill  . Calcium Carbonate-Vitamin D 600-400 MG-UNIT per chew tablet Chew 1 tablet by mouth 2 (two) times daily.    Marland Kitchen ibandronate (BONIVA) 150 MG tablet Take 1 tablet (150 mg total) by mouth every 30 (thirty) days. Take in the morning with a full glass of water, on an empty stomach, and do not take anything else by mouth or lie down for the next 30 min. 3 tablet 4  . meclizine (ANTIVERT) 25 MG tablet Take one tablet every 8 hours as needed for dizziness 30 tablet 0  . meloxicam (MOBIC) 7.5 MG tablet TAKE 1 TABLET BY MOUTH  DAILY AS NEEDED FOR PAIN 90 tablet 1  . traZODone (DESYREL) 50 MG tablet TAKE 1/2 TO 1 TABLET BY  MOUTH AT BEDTIME AS NEEDED  FOR SLEEP 90  tablet 1  . varenicline (CHANTIX STARTING MONTH PAK) 0.5 MG X 11 & 1 MG X 42 tablet Take one 0.5 mg tablet by mouth once daily for 3 days, then increase to one 0.5 mg tablet twice daily for 4 days, then increase to one 1 mg tablet twice daily. 53 tablet 0   No current facility-administered medications on file prior to visit.     BP (!) 114/53 (BP Location: Right Arm, Patient Position: Sitting, Cuff Size: Small)   Pulse 74   Temp 98.7 F (37.1 C) (Oral)   Resp 16   Ht 5\' 3"  (1.6 m)   Wt 118 lb 9.6 oz (53.8 kg)   SpO2 98%   BMI 21.01 kg/m       Objective:   Physical Exam Constitutional:      Appearance: She is well-developed.  Neck:     Musculoskeletal: Neck supple.     Thyroid: No thyromegaly.  Cardiovascular:     Rate and Rhythm: Normal rate and regular rhythm.     Heart sounds: Normal heart sounds. No murmur.  Pulmonary:     Effort: Pulmonary effort is normal. No respiratory distress.     Breath sounds: Normal breath sounds. No wheezing.  Skin:    General: Skin is warm and dry.  Neurological:     Mental Status: She is alert and oriented to person, place, and time.  Psychiatric:        Thought Content: Thought content normal.        Judgment: Judgment normal.     Comments: Tearful            Assessment & Plan:  Cough-will obtain chest x-ray to rule out pneumonia.  She does not have any wheezing today but she does have a Combivent inhaler that she is using as needed.  Will defer antibiotics pending review of chest x-ray results.  Tobacco abuse- she reports that Chantix is not on her drug formulary.  I will give her a trial of Wellbutrin instead.  She is motivated to quit smoking.  We discussed okay to use NicoDerm patch or nicotine gum while using Wellbutrin.  Depression- I think she has a lot of  stress in caring for her mother and is exhibiting some symptoms of depression.  I have suggested trial of Wellbutrin to help with depression as well as smoking  cessation.  Kidney cyst- this was previously noted on ultrasound.  She deferred MRI pending new insurance which is now active under [employer].  Will reorder MRI for further evaluation of renal cyst.

## 2018-11-09 NOTE — Telephone Encounter (Signed)
PA initiated via Covermymeds; KEY: [id number]. Awaiting determination.

## 2018-11-09 NOTE — Patient Instructions (Signed)
Complete x ray on the first floor. You should be contacted about scheduling your MRI after we obtain insurance approval.  Begin wellbutrin.

## 2018-11-10 NOTE — Telephone Encounter (Signed)
PA denied. Pt must try and fail bupropion (generic Zyban) first.

## 2018-11-10 NOTE — Telephone Encounter (Signed)
Noted  

## 2018-11-18 ENCOUNTER — Encounter: Payer: Self-pay | Admitting: Family

## 2018-11-22 NOTE — Telephone Encounter (Signed)
-----   Message from Debbrah Alar, NP sent at 11/22/2018 10:11 AM EDT -----  ----- Message ----- From: Debbrah Alar, NP Sent: 11/22/2018  10:09 AM EDT To: Trenda Moots  Approval number # [id number] good through 6/16. ----- Message ----- From: Trenda Moots Sent: 11/22/2018   9:42 AM EDT To: Debbrah Alar, NP  Precert was not approved, please call [phone] Ref# [id number] for peer to peer ----- Message ----- From: Debbrah Alar, NP Sent: 11/18/2018   2:51 PM EDT To: Trenda Moots  Can you please check status of MRI scheduling/pre-CERT?  Patient reach out to me since she has not been contacted.  This was ordered it looks like on March 4.  Thank you

## 2018-12-05 ENCOUNTER — Encounter: Payer: Self-pay | Admitting: Family

## 2018-12-06 NOTE — Telephone Encounter (Signed)
Spoke with pt. She states someone just phoned her and she will do FaceTime with PCP for appt tomorrow.

## 2018-12-07 ENCOUNTER — Other Ambulatory Visit: Payer: Self-pay

## 2018-12-07 ENCOUNTER — Ambulatory Visit (INDEPENDENT_AMBULATORY_CARE_PROVIDER_SITE_OTHER): Payer: Medicare HMO | Admitting: Family

## 2018-12-07 ENCOUNTER — Telehealth: Payer: Self-pay | Admitting: Family

## 2018-12-07 DIAGNOSIS — F329 Major depressive disorder, single episode, unspecified: Secondary | ICD-10-CM | POA: Diagnosis not present

## 2018-12-07 DIAGNOSIS — Z720 Tobacco use: Secondary | ICD-10-CM | POA: Diagnosis not present

## 2018-12-07 DIAGNOSIS — N281 Cyst of kidney, acquired: Secondary | ICD-10-CM | POA: Diagnosis not present

## 2018-12-07 DIAGNOSIS — F32A Depression, unspecified: Secondary | ICD-10-CM

## 2018-12-07 DIAGNOSIS — N2889 Other specified disorders of kidney and ureter: Secondary | ICD-10-CM

## 2018-12-07 DIAGNOSIS — R69 Illness, unspecified: Secondary | ICD-10-CM | POA: Diagnosis not present

## 2018-12-07 MED ORDER — ESCITALOPRAM OXALATE 10 MG PO TABS: 5.0000 mg | ORAL_TABLET | Freq: Every day | ORAL | 0 refills | Status: DC

## 2018-12-07 MED ORDER — BUPROPION HCL ER (XL) 300 MG PO TB24: 300.0000 mg | ORAL_TABLET | Freq: Every day | ORAL | 1 refills | Status: DC

## 2018-12-07 NOTE — Telephone Encounter (Signed)
Spoke with pt. She wishes to proceed with MRI as soon as possible since it has already been pushed off.  Will order to be done soon.

## 2018-12-07 NOTE — Progress Notes (Signed)
Virtual Visit via Video Note  I connected with Taylor Atkins on 12/07/18 at  9:00 AM EDT by a video enabled telemedicine application and verified that I am speaking with the correct person using two identifiers.   I discussed the limitations of evaluation and management by telemedicine and the availability of in person appointments. The patient expressed understanding and agreed to proceed.  Only the patient and myself were on today's video visit. The patient was at home and I was in my office at the time of today's visit.   History of Present Illness:  Taylor Atkins is a 63 yr old female who presents today for follow up.  Depression- last visit she described stress in caring for her elderly mother. Noted no motivation.  Tobacco abuse last visit she reported interest in cessation.  An rx for wellbutrin was prescribed to help with mood and smoking cessation.    Kidney cyst- previously noted on [place].  She deferred MRI due to insurance.  States that she is now ready to proceed.   Patient reports that it is not helping too much with mood swings.  Helping a little bit with smoking but not a lot.    Observations/Objective: Depression screen [facility] 2/9 12/07/2018 03/03/2018 02/16/2017  Decreased Interest 3 1 0  Down, Depressed, Hopeless 3 1 0  PHQ - 2 Score 6 2 0  Altered sleeping 0 0 -  Tired, decreased energy 1 3 -  Change in appetite 0 1 -  Feeling bad or failure about yourself  0 0 -  Trouble concentrating 0 1 -  Moving slowly or fidgety/restless 0 0 -  Suicidal thoughts 0 0 -  PHQ-9 Score 7 7 -  Difficult doing work/chores Somewhat difficult - -    Gen: Awake, alert, no acute distress Resp: Breathing is even and non-labored Psych: calm/pleasant demeanor Neuro: Alert and Oriented x 3, + facial symmetry, speech is clear.    Assessment and Plan:  Depression- uncontrolled. Will increase wellbutrin and add lexapro 5mg .    Tobacco abuse-uncontrolled. Will increase wellbutrin from 150 to  300mg  xl once daily.   Kidney cyst- will schedule MRI for further evaluation.   Follow Up Instructions:    I discussed the assessment and treatment plan with the patient. The patient was provided an opportunity to ask questions and all were answered. The patient agreed with the plan and demonstrated an understanding of the instructions.   The patient was advised to call back or seek an in-person evaluation if the symptoms worsen or if the condition fails to improve as anticipated.    Nance Pear, NP

## 2018-12-09 ENCOUNTER — Other Ambulatory Visit: Payer: Self-pay

## 2018-12-09 ENCOUNTER — Other Ambulatory Visit (INDEPENDENT_AMBULATORY_CARE_PROVIDER_SITE_OTHER): Payer: Medicare HMO

## 2018-12-09 DIAGNOSIS — N2889 Other specified disorders of kidney and ureter: Secondary | ICD-10-CM

## 2018-12-09 LAB — BASIC METABOLIC PANEL
BUN: 14 mg/dL (ref 6–23)
CO2: 30 mEq/L (ref 19–32)
Calcium: 9.2 mg/dL (ref 8.4–10.5)
Chloride: 102 mEq/L (ref 96–112)
Creatinine, Ser: 0.91 mg/dL (ref 0.40–1.20)
GFR: 62.03 mL/min (ref >60.00)
Glucose, Bld: 99 mg/dL (ref 70–99)
Potassium: 4.1 mEq/L (ref 3.5–5.1)
Sodium: 140 mEq/L (ref 135–145)

## 2018-12-12 ENCOUNTER — Other Ambulatory Visit: Payer: Self-pay

## 2018-12-12 ENCOUNTER — Ambulatory Visit: Payer: Medicare HMO

## 2018-12-12 ENCOUNTER — Encounter: Payer: Self-pay | Admitting: Family

## 2018-12-12 DIAGNOSIS — N281 Cyst of kidney, acquired: Secondary | ICD-10-CM | POA: Diagnosis not present

## 2018-12-12 DIAGNOSIS — N2889 Other specified disorders of kidney and ureter: Secondary | ICD-10-CM

## 2018-12-12 MED ORDER — GADOBENATE DIMEGLUMINE 529 MG/ML IV SOLN
10.0000 mL | Freq: Once | INTRAVENOUS | Status: AC | PRN
  Administered 2018-12-12: 11:00:00 10 mL via INTRAVENOUS

## 2018-12-20 ENCOUNTER — Encounter: Payer: Self-pay | Admitting: Family

## 2018-12-22 ENCOUNTER — Other Ambulatory Visit: Payer: Self-pay | Admitting: Family

## 2019-02-02 ENCOUNTER — Other Ambulatory Visit: Payer: Self-pay | Admitting: Family

## 2019-02-10 MED ORDER — ESCITALOPRAM OXALATE 10 MG PO TABS: 5.0000 mg | ORAL_TABLET | Freq: Every day | ORAL | 1 refills | Status: DC

## 2019-02-14 ENCOUNTER — Other Ambulatory Visit: Payer: Self-pay | Admitting: Family

## 2019-03-02 ENCOUNTER — Encounter: Payer: Self-pay | Admitting: Family

## 2019-03-02 MED ORDER — TRAZODONE HCL 50 MG PO TABS: 25.0000 mg | ORAL_TABLET | Freq: Every evening | ORAL | 1 refills | Status: DC | PRN

## 2019-03-27 ENCOUNTER — Other Ambulatory Visit: Payer: Self-pay | Admitting: Family

## 2019-03-27 MED ORDER — BUPROPION HCL ER (XL) 300 MG PO TB24: 300.0000 mg | ORAL_TABLET | Freq: Every day | ORAL | 5 refills | Status: DC

## 2019-04-21 DIAGNOSIS — E042 Nontoxic multinodular goiter: Secondary | ICD-10-CM | POA: Diagnosis not present

## 2019-04-21 DIAGNOSIS — E0590 Thyrotoxicosis, unspecified without thyrotoxic crisis or storm: Secondary | ICD-10-CM | POA: Diagnosis not present

## 2019-05-01 DIAGNOSIS — R69 Illness, unspecified: Secondary | ICD-10-CM | POA: Diagnosis not present

## 2019-05-10 DIAGNOSIS — H5203 Hypermetropia, bilateral: Secondary | ICD-10-CM | POA: Diagnosis not present

## 2019-05-10 DIAGNOSIS — H524 Presbyopia: Secondary | ICD-10-CM | POA: Diagnosis not present

## 2019-05-10 DIAGNOSIS — D3131 Benign neoplasm of right choroid: Secondary | ICD-10-CM | POA: Diagnosis not present

## 2019-05-10 DIAGNOSIS — H2513 Age-related nuclear cataract, bilateral: Secondary | ICD-10-CM | POA: Diagnosis not present

## 2019-05-23 DIAGNOSIS — R69 Illness, unspecified: Secondary | ICD-10-CM | POA: Diagnosis not present

## 2019-05-26 ENCOUNTER — Other Ambulatory Visit: Payer: Self-pay

## 2019-05-26 DIAGNOSIS — Z20822 Contact with and (suspected) exposure to covid-19: Secondary | ICD-10-CM

## 2019-05-26 DIAGNOSIS — R6889 Other general symptoms and signs: Secondary | ICD-10-CM | POA: Diagnosis not present

## 2019-05-27 LAB — NOVEL CORONAVIRUS, NAA: SARS-CoV-2, NAA: NOT DETECTED

## 2019-06-15 ENCOUNTER — Other Ambulatory Visit: Payer: Self-pay

## 2019-06-16 ENCOUNTER — Ambulatory Visit (INDEPENDENT_AMBULATORY_CARE_PROVIDER_SITE_OTHER): Payer: Medicare HMO | Admitting: Family

## 2019-06-16 ENCOUNTER — Encounter: Payer: Self-pay | Admitting: Family

## 2019-06-16 ENCOUNTER — Other Ambulatory Visit: Payer: Self-pay

## 2019-06-16 VITALS — BP 127/57 | HR 63 | Temp 98.2°F | Resp 16 | Ht 63.0 in | Wt 111.0 lb

## 2019-06-16 DIAGNOSIS — Z23 Encounter for immunization: Secondary | ICD-10-CM | POA: Diagnosis not present

## 2019-06-16 DIAGNOSIS — H9201 Otalgia, right ear: Secondary | ICD-10-CM

## 2019-06-16 DIAGNOSIS — K429 Umbilical hernia without obstruction or gangrene: Secondary | ICD-10-CM | POA: Diagnosis not present

## 2019-06-16 MED ORDER — AMOXICILLIN 500 MG PO CAPS: 500.0000 mg | ORAL_CAPSULE | Freq: Three times a day (TID) | ORAL | 0 refills | Status: DC

## 2019-06-16 MED FILL — AMOXICILLIN 500 MG CAPSULE: 500 | 10 days supply | Qty: 30 | Fill #0

## 2019-06-16 NOTE — Patient Instructions (Signed)
Please begin amoxicillin for right ear.  Call if symptoms worsen or if symptoms fail to improve.

## 2019-06-16 NOTE — Progress Notes (Signed)
Subjective:    Patient ID: Taylor Atkins, female    DOB: 07-24-1956, 63 y.o.   MRN: [id number]  HPI  Patient is a 63 yr old female who presents today with chief complaint of abdominal hernia.  Noticed it a few times. Some discomfort when she bends over and her waist band presses on the area. Notes some constipation. Denies Nausea or vomitting.   Also reports right sided ear pain. Reports that ear pain began 2 weeks ago. Reports that pain is intermittent. Hurst if she presses on her ear.  Denies fever or sore throat.    Review of Systems   See HPI  Past Medical History:  Diagnosis Date  . Acne   . Anxiety   . Goiter   . Osteopenia   . Skin cancer 6/15  . Thyroid disease   . Tobacco use disorder      Social History   Socioeconomic History  . Marital status: Married    Spouse name: Not on file  . Number of children: Not on file  . Years of education: Not on file  . Highest education level: Not on file  Occupational History  . Not on file  Social Needs  . Financial resource strain: Not on file  . Food insecurity    Worry: Not on file    Inability: Not on file  . Transportation needs    Medical: Not on file    Non-medical: Not on file  Tobacco Use  . Smoking status: Current Every Day Smoker    Years: 40.00    Types: Cigarettes    Last attempt to quit: 01/13/2016    Years since quitting: 3.4  . Smokeless tobacco: Never Used  . Tobacco comment: 5 CIGARETTES DAILY  Substance and Sexual Activity  . Alcohol use: Yes    Alcohol/week: 0.0 standard drinks    Comment: seldom  . Drug use: No  . Sexual activity: Yes  Lifestyle  . Physical activity    Days per week: Not on file    Minutes per session: Not on file  . Stress: Not on file  Relationships  . Social Herbalist on phone: Not on file    Gets together: Not on file    Attends religious service: Not on file    Active member of club or organization: Not on file    Attends meetings of clubs or  organizations: Not on file    Relationship status: Not on file  . Intimate partner violence    Fear of current or ex partner: Not on file    Emotionally abused: Not on file    Physically abused: Not on file    Forced sexual activity: Not on file  Other Topics Concern  . 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 [place]  . [place]  . EXCISION MASS UPPER EXTREMETIES     AVM  . [place]  . TUBAL LIGATION  1993    Family History  Problem Relation Age of Onset  . Hypertension Mother        Afib, living  . Heart disease Father        Afib,  pacemaker, PVD  . Hypertension Father   . Diabetes Brother   . Hypertension Brother   . Diabetes Brother   . Hypertension Brother   . Colon cancer Neg Hx     Allergies  Allergen Reactions  . Fosamax [Alendronate Sodium] Other (See Comments)    Extreme nausea    Current Outpatient Medications on File Prior to Visit  Medication Sig Dispense Refill  . Calcium Carbonate-Vitamin D 600-400 MG-UNIT per chew tablet Chew 1 tablet by mouth 2 (two) times daily.    Marland Kitchen escitalopram (LEXAPRO) 10 MG tablet Take 0.5 tablets (5 mg total) by mouth daily. 45 tablet 1  . ibandronate (BONIVA) 150 MG tablet Take 1 tablet (150 mg total) by mouth every 30 (thirty) days. Take in the morning with a full glass of water, on an empty stomach, and do not take anything else by mouth or lie down for the next 30 min. 3 tablet 4  . meclizine (ANTIVERT) 25 MG tablet Take one tablet every 8 hours as needed for dizziness 30 tablet 0  . meloxicam (MOBIC) 7.5 MG tablet TAKE 1 TABLET BY MOUTH  DAILY AS NEEDED FOR PAIN 90 tablet 1  . traZODone (DESYREL) 50 MG tablet Take 0.5-1 tablets (25-50 mg total) by mouth at bedtime as needed. for sleep  90 tablet 1  . varenicline (CHANTIX STARTING MONTH PAK) 0.5 MG X 11 & 1 MG X 42 tablet Take one 0.5 mg tablet by mouth once daily for 3 days, then increase to one 0.5 mg tablet twice daily for 4 days, then increase to one 1 mg tablet twice daily. 53 tablet 0  . buPROPion (WELLBUTRIN XL) 300 MG 24 hr tablet Take 1 tablet (300 mg total) by mouth daily. (Patient not taking: Reported on 06/16/2019) 30 tablet 5   No current facility-administered medications on file prior to visit.     BP (!) 127/57 (BP Location: Right Arm, Patient Position: Sitting, Cuff Size: Small)   Pulse 63   Temp 98.2 F (36.8 C) (Temporal)   Resp 16   Ht _0  (1.6 m)   Wt 111 lb (50.3 kg)   SpO2 98%   BMI 19.66 kg/m        Objective:   Physical Exam Constitutional:      Appearance: She is well-developed.  HENT:     Ears:     Comments: Bilateral cerumen impaction. L ear cerumen was removed with lavage, R ear cerumen was not easily removed with lavage or curette. Both ear canals appear normal. L TM normal.  Neck:     Musculoskeletal: Neck supple.     Thyroid: No thyromegaly.  Cardiovascular:     Rate and Rhythm: Normal rate and regular rhythm.     Heart sounds: Normal heart sounds. No murmur.  Pulmonary:     Effort: Pulmonary effort is normal. No respiratory distress.     Breath sounds: Normal breath sounds. No wheezing.  Abdominal:     General: There is no distension.     Palpations: Abdomen is soft.     Tenderness: There is no abdominal tenderness.     Comments: Small reducible hernia noted above the umbilicus  Skin:    General: Skin is warm and dry.  Neurological:     Mental Status: She is alert and oriented to person, place, and time.  Psychiatric:        Behavior: Behavior normal.        Thought Content: Thought content normal.  Judgment: Judgment normal.           Assessment & Plan:  Umbilical hernia- requesting surgical referral. Referral placed. Discussed signs/symptoms of  strangulated hernia and that she should go to the ED if this occurs. Pt verbalizes understanding.   Otalgia- unable to visualize right TM. Will rx empirically with amoxicillin. Pt advised to purchase otc ear wax kit such as debrox to help remove cerumen on right.

## 2019-06-21 ENCOUNTER — Encounter: Payer: Self-pay | Admitting: Family

## 2019-06-30 DIAGNOSIS — K429 Umbilical hernia without obstruction or gangrene: Secondary | ICD-10-CM | POA: Diagnosis not present

## 2019-08-02 DIAGNOSIS — Z20828 Contact with and (suspected) exposure to other viral communicable diseases: Secondary | ICD-10-CM | POA: Diagnosis not present

## 2019-08-02 DIAGNOSIS — K429 Umbilical hernia without obstruction or gangrene: Secondary | ICD-10-CM | POA: Diagnosis not present

## 2019-08-02 DIAGNOSIS — Z01812 Encounter for preprocedural laboratory examination: Secondary | ICD-10-CM | POA: Diagnosis not present

## 2019-08-08 DIAGNOSIS — K429 Umbilical hernia without obstruction or gangrene: Secondary | ICD-10-CM | POA: Diagnosis not present

## 2019-08-08 MED FILL — HYDROCODON-APAP 5-325: 5-325 | 3 days supply | Qty: 15 | Fill #0

## 2019-08-15 ENCOUNTER — Other Ambulatory Visit: Payer: Self-pay | Admitting: Family

## 2019-09-08 DIAGNOSIS — D099 Carcinoma in situ, unspecified: Secondary | ICD-10-CM

## 2019-09-08 DIAGNOSIS — C4491 Basal cell carcinoma of skin, unspecified: Secondary | ICD-10-CM

## 2019-09-08 HISTORY — DX: Carcinoma in situ, unspecified: D09.9

## 2019-09-08 HISTORY — DX: Basal cell carcinoma of skin, unspecified: C44.91

## 2019-09-29 ENCOUNTER — Ambulatory Visit: Payer: Medicare HMO | Attending: Internal Medicine

## 2019-09-29 DIAGNOSIS — Z23 Encounter for immunization: Secondary | ICD-10-CM

## 2019-09-29 NOTE — Progress Notes (Signed)
   Covid-19 Vaccination Clinic  Name:  Taylor Atkins    MRN: [id number] DOB: 04-13-1956  09/29/2019  [name] was observed post Covid-19 immunization for 15 minutes without incidence. She was provided with Vaccine Information Sheet and instruction to access the V-Safe system.   [name] was instructed to call 911 with any severe reactions post vaccine: Marland Kitchen Difficulty breathing  . Swelling of your face and throat  . A fast heartbeat  . A bad rash all over your body  . Dizziness and weakness    Immunizations Administered    Name Date Dose VIS Date Route   Pfizer COVID-19 Vaccine 09/29/2019 10:25 AM 0.3 mL 08/18/2019 Intramuscular   Manufacturer: [place]   Lot: [id number]   [place]: [id number]

## 2019-10-14 ENCOUNTER — Ambulatory Visit (INDEPENDENT_AMBULATORY_CARE_PROVIDER_SITE_OTHER)
Admission: RE | Admit: 2019-10-14 | Discharge: 2019-10-14 | Disposition: A | Discharge status: Home / Self Care | Payer: Medicare HMO | Source: Ambulatory Visit

## 2019-10-14 DIAGNOSIS — H1031 Unspecified acute conjunctivitis, right eye: Secondary | ICD-10-CM

## 2019-10-14 DIAGNOSIS — R21 Rash and other nonspecific skin eruption: Secondary | ICD-10-CM | POA: Diagnosis not present

## 2019-10-14 MED ORDER — OFLOXACIN 0.3 % OP SOLN: 1.0000 [drp] | Freq: Four times a day (QID) | OPHTHALMIC | 0 refills | Status: AC

## 2019-10-14 NOTE — ED Provider Notes (Addendum)
Virtual Visit via Video Note:  Taylor Atkins  initiated request for Telemedicine visit with [facility] Urgent [facility] team. I connected with Taylor Atkins  on 10/14/2019 at 8:56 AM  for a synchronized telemedicine visit using a video enabled HIPPA compliant telemedicine application. I verified that I am speaking with Taylor Atkins  using two identifiers. Taylor Pieri Jodell Cipro, PA-C  was physically located in a [facility] Urgent care site and Taylor Atkins was located at a different location.   The limitations of evaluation and management by telemedicine as well as the availability of in-person appointments were discussed. Patient was informed that she  may incur a bill ( including co-pay) for this virtual visit encounter. Taylor Atkins  expressed understanding and gave verbal consent to proceed with virtual visit.     History of Present Illness:Taylor Atkins  is a 64 y.o. female presents with 2 day history of right eye foreign body sensation. Foreign body sensation has since resolved. Now with tearing, yellow eye drainage, eye redness. Had blurry vision that has improved. Denies photophobia. Has rhinorrhea, nasal congestion, cough for the past few weeks. Denies fever, chills, body aches. Reading glasses, no contact lens use. Had tried visine allergy drop without relief.   Patient also noticed lesion/swelling to the right check, inferior to right lower eyelid. Area is erythematous and appeared in the past 2 days.  Past Medical History:  Diagnosis Date  . Acne   . Anxiety   . Goiter   . Osteopenia   . Skin cancer 6/15  . Thyroid disease   . Tobacco use disorder     Allergies  Allergen Reactions  . Fosamax [Alendronate Sodium] Other (See Comments)    Extreme nausea        Observations/Objective: General: Well appearing, nontoxic, no acute distress. Sitting comfortably. Head: Normocephalic, atraumatic. Isolated maculopapular rash to the right cheek.  Eye: Exam limited due  to video visit. Right eye conjunctiva injection. Pupil round. EOMI. No obvious photophobia. ENT: Mucus membranes moist, no lip cracking. No obvious nasal drainage. Pulm: Speaking in full sentences without difficulty. Normal effort. No respiratory distress, accessory muscle use. Neuro: Normal mental status. Alert and oriented x 3.   Assessment and Plan: At this time will cover for conjunctivitis with ofloxacin. Discussed symptoms could also be caused by viral etiology, though less likely as URI symptoms has been occurring for weeks, and symptoms unilateral. Did discuss symptomatic management for URI symptoms. Will have patient do warm compress to the cheek for rash at this time. Return precautions given. Patient expresses understanding and agrees to plan.   Follow Up Instructions:    I discussed the assessment and treatment plan with the patient. The patient was provided an opportunity to ask questions and all were answered. The patient agreed with the plan and demonstrated an understanding of the instructions.   The patient was advised to call back or seek an in-person evaluation if the symptoms worsen or if the condition fails to improve as anticipated.  I provided 15 minutes of non-face-to-face time during this encounter.    Taylor Edwards, PA-C  10/14/2019 8:56 AM         Taylor Edwards, PA-C 10/14/19 [place], Riyad Keena V, PA-C 10/14/19 1143

## 2019-10-14 NOTE — Discharge Instructions (Signed)
Use ofloxacin eyedrops as directed on right eye. Artificial tear drop/gel at night. Wait 10-15 minutes between drops, always use artificial tear gel last, as it prevents drops from penetrating through. Lid scrubs and warm compresses as directed. Monitor for any worsening of symptoms, changes in vision, sensitivity to light, eye swelling, painful eye movement, follow up with ophthalmology for further evaluation.   [facility] at [facility]) [place], [place], [place] [address] [phone]  [facility] at [facility] [address] Evlyn Courier [place], [place] [address] [phone]  [facility] at [facility] [address] [place], [place] [address] [phone]  [facility] at [place], [place], [place], [place] [address] [phone]  [facility] at [place] [address]ts [place], [place] [address] [phone]  [facility] at [address], [place], [place] [address] [phone]

## 2019-10-19 ENCOUNTER — Ambulatory Visit: Payer: Medicare HMO | Attending: Internal Medicine

## 2019-10-19 DIAGNOSIS — Z23 Encounter for immunization: Secondary | ICD-10-CM | POA: Insufficient documentation

## 2019-10-19 NOTE — Progress Notes (Signed)
   Covid-19 Vaccination Clinic  Name:  Taylor Atkins    MRN: [id number] DOB: 24-Aug-1956  10/19/2019  [name] was observed post Covid-19 immunization for 15 minutes without incidence. She was provided with Vaccine Information Sheet and instruction to access the V-Safe system.   [name] was instructed to call 911 with any severe reactions post vaccine: Marland Kitchen Difficulty breathing  . Swelling of your face and throat  . A fast heartbeat  . A bad rash all over your body  . Dizziness and weakness    Immunizations Administered    Name Date Dose VIS Date Route   Pfizer COVID-19 Vaccine 10/19/2019  3:30 PM 0.3 mL 08/18/2019 Intramuscular   Manufacturer: [place]   Lot: [id number]   [place]: [id number]

## 2019-10-25 ENCOUNTER — Ambulatory Visit: Payer: Medicare HMO

## 2019-10-30 ENCOUNTER — Encounter: Payer: Self-pay | Admitting: Family

## 2019-10-30 ENCOUNTER — Telehealth (INDEPENDENT_AMBULATORY_CARE_PROVIDER_SITE_OTHER): Payer: Medicare HMO | Admitting: Family

## 2019-10-30 ENCOUNTER — Ambulatory Visit (HOSPITAL_BASED_OUTPATIENT_CLINIC_OR_DEPARTMENT_OTHER)
Admission: RE | Admit: 2019-10-30 | Discharge: 2019-10-30 | Disposition: A | Discharge status: Home / Self Care | Payer: Medicare HMO | Source: Ambulatory Visit | Attending: Family | Admitting: Family

## 2019-10-30 ENCOUNTER — Telehealth: Payer: Self-pay | Admitting: Family

## 2019-10-30 ENCOUNTER — Other Ambulatory Visit: Payer: Self-pay

## 2019-10-30 VITALS — HR 102 | Temp 98.4°F | Ht 63.0 in | Wt 103.4 lb

## 2019-10-30 DIAGNOSIS — J440 Chronic obstructive pulmonary disease with acute lower respiratory infection: Secondary | ICD-10-CM

## 2019-10-30 DIAGNOSIS — R05 Cough: Secondary | ICD-10-CM | POA: Diagnosis not present

## 2019-10-30 DIAGNOSIS — J209 Acute bronchitis, unspecified: Secondary | ICD-10-CM | POA: Insufficient documentation

## 2019-10-30 DIAGNOSIS — Z122 Encounter for screening for malignant neoplasm of respiratory organs: Secondary | ICD-10-CM

## 2019-10-30 MED ORDER — PREDNISONE 10 MG PO TABS: ORAL_TABLET | ORAL | 0 refills | Status: DC

## 2019-10-30 MED ORDER — AZITHROMYCIN 250 MG PO TABS: ORAL_TABLET | ORAL | 0 refills | Status: DC

## 2019-10-30 MED ORDER — ALBUTEROL SULFATE HFA 108 (90 BASE) MCG/ACT IN AERS: 2.0000 | INHALATION_SPRAY | Freq: Four times a day (QID) | RESPIRATORY_TRACT | 1 refills | Status: DC | PRN

## 2019-10-30 MED FILL — predniSONE 10 MG TABS: 10 | 8 days supply | Qty: 20 | Fill #0

## 2019-10-30 MED FILL — ALBUTEROL SULFATE HFA 108 (: 108 (90 BAS | 25 days supply | Qty: 18 | Fill #0

## 2019-10-30 MED FILL — AZITHROMYCIN 250 MG TABS: 250 | 3 days supply | Qty: 6 | Fill #0

## 2019-10-30 NOTE — Progress Notes (Signed)
Virtual Visit via Video Note  I connected with Taylor Atkins on 10/30/19 at  7:00 AM EST by a video enabled telemedicine application and verified that I am speaking with the correct person using two identifiers.  Location: Patient: home Provider: work   I discussed the limitations of evaluation and management by telemedicine and the availability of in person appointments. The patient expressed understanding and agreed to proceed.  History of Present Illness:  Reports increasing sob (worse with coughing).  Using mucinex with mild improvement.  Worse sob with exertion.  Denies LE edema.  Denies chest pain.  She does have some left rib pain due to coughing.  Denies known fever.  Does wake up sweating some.  Notes that she has been sleeping on her recliner since her surgery. She states she is currently smoking 5-10 cigarettes/day.  Notes generalized fatigue x 1 month- has had both covid vaccines.  Past Medical History:  Diagnosis Date  . Acne   . Anxiety   . Goiter   . Osteopenia   . Skin cancer 6/15  . Thyroid disease   . Tobacco use disorder      Social History   Socioeconomic History  . Marital status: Married    Spouse name: Not on file  . Number of children: Not on file  . Years of education: Not on file  . Highest education level: Not on file  Occupational History  . Not on file  Tobacco Use  . Smoking status: Current Every Day Smoker    Years: 40.00    Types: Cigarettes    Last attempt to quit: 01/13/2016    Years since quitting: 3.7  . Smokeless tobacco: Never Used  . Tobacco comment: 5 CIGARETTES DAILY  Substance and Sexual Activity  . Alcohol use: Yes    Alcohol/week: 0.0 standard drinks    Comment: seldom  . Drug use: No  . Sexual activity: Yes  Other Topics Concern  . 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 Difficulty of Paying Living Expenses: Not on file  Food Insecurity:   . Worried About Charity fundraiser in the Last Year: Not on file  . Ran Out of Food in the Last Year: Not on file  Transportation Needs:   . Lack of Transportation (Medical): Not on file  . Lack of Transportation (Non-Medical): Not on file  Physical Activity:   . Days of Exercise per Week: Not on file  . Minutes of Exercise per Session: Not on file  Stress:   . Feeling of Stress : Not on file  Social Connections:   . Frequency of Communication with Friends and Family: Not on file  . Frequency of Social Gatherings with Friends and Family: Not on file  . Attends Religious Services: Not on file  . Active Member of Clubs or Organizations: Not on file  . Attends Archivist Meetings: Not on file  . Marital Status: Not on file  Intimate Partner Violence:   . Fear of Current or Ex-Partner: Not on file  . Emotionally Abused: Not on file  . Physically Abused: Not on file  . Sexually Abused: Not on file    Past Surgical History:  Procedure  Laterality Date  . [place]  . [place]  . EXCISION MASS UPPER EXTREMETIES     AVM  . [place]  . TUBAL LIGATION  1993    Family History  Problem Relation Age of Onset  . Hypertension Mother        Afib, living  . Heart disease Father        Afib, pacemaker, PVD  . Hypertension Father   . Diabetes Brother   . Hypertension Brother   . Diabetes Brother   . Hypertension Brother   . Colon cancer Neg Hx     Allergies  Allergen Reactions  . Fosamax [Alendronate Sodium] Other (See Comments)    Extreme nausea    Current Outpatient Medications on File Prior to Visit  Medication Sig Dispense Refill  . Calcium Carbonate-Vitamin D 600-400 MG-UNIT per chew tablet Chew 1 tablet by mouth 2 (two) times daily.    Marland Kitchen escitalopram (LEXAPRO) 10 MG tablet TAKE  1/2 TABLET BY MOUTH DAILY 45 tablet 0  . ibandronate (BONIVA) 150 MG tablet Take 1 tablet (150 mg total) by mouth every 30 (thirty) days. Take in the morning with a full glass of water, on an empty stomach, and do not take anything else by mouth or lie down for the next 30 min. 3 tablet 4  . meclizine (ANTIVERT) 25 MG tablet Take one tablet every 8 hours as needed for dizziness 30 tablet 0  . meloxicam (MOBIC) 7.5 MG tablet TAKE 1 TABLET BY MOUTH  DAILY AS NEEDED FOR PAIN 90 tablet 1  . traZODone (DESYREL) 50 MG tablet TAKE ONE-HALF TO ONE TABLET BY MOUTH AT BEDTIME AS NEEDED FOR SLEEP 90 tablet 0  . [DISCONTINUED] buPROPion (WELLBUTRIN XL) 300 MG 24 hr tablet Take 1 tablet (300 mg total) by mouth daily. (Patient not taking: Reported on 06/16/2019) 30 tablet 5   No current facility-administered medications on file prior to visit.    Pulse (!) 102   Temp 98.4 F (36.9 C) (Oral)   Ht 5\' 3"  (1.6 m)   Wt 103 lb 6.4 oz (46.9 kg)   SpO2 94%   BMI 18.32 kg/m       Observations/Objective:   Gen: Awake, alert, no acute distress Resp: Breathing is even and non-labored Psych: calm/pleasant demeanor Neuro: Alert and Oriented x 3, + facial symmetry, speech is clear.   Assessment and Plan:  Bronchitis/COPD exacerbation- will obtain cxr to rule out pneumonia.  Rx with prednisone taper, albuterol MDI PRN, azithromycin.  She is advised to call if symptoms worsen or if symptoms fail to improve and to go to the ER if severe symptoms.  Plan follow up in 1 week face to face.   Lung cancer screening- pt states ready to proceed with low dose CT- will order.   Follow Up Instructions:    I discussed the assessment and treatment plan with the patient. The patient was provided an opportunity to ask questions and all were answered. The patient agreed with the plan and demonstrated an understanding of the instructions.   The patient was advised to call back or seek an in-person evaluation if the symptoms  worsen or if the condition fails to improve as anticipated.  Nance Pear, NP

## 2019-10-30 NOTE — Telephone Encounter (Signed)
Opened in error

## 2019-10-30 NOTE — Addendum Note (Signed)
Addended by: Debbrah Alar on: 10/30/2019 04:46 PM   Modules accepted: Orders

## 2019-10-31 ENCOUNTER — Other Ambulatory Visit (HOSPITAL_BASED_OUTPATIENT_CLINIC_OR_DEPARTMENT_OTHER): Payer: Self-pay | Admitting: Family

## 2019-10-31 DIAGNOSIS — Z1231 Encounter for screening mammogram for malignant neoplasm of breast: Secondary | ICD-10-CM

## 2019-11-06 ENCOUNTER — Other Ambulatory Visit: Payer: Self-pay

## 2019-11-07 ENCOUNTER — Ambulatory Visit (HOSPITAL_BASED_OUTPATIENT_CLINIC_OR_DEPARTMENT_OTHER)
Admission: RE | Admit: 2019-11-07 | Discharge: 2019-11-07 | Disposition: A | Discharge status: Home / Self Care | Payer: Medicare HMO | Source: Ambulatory Visit | Attending: Family | Admitting: Family

## 2019-11-07 ENCOUNTER — Ambulatory Visit (INDEPENDENT_AMBULATORY_CARE_PROVIDER_SITE_OTHER): Payer: Medicare HMO | Admitting: Family

## 2019-11-07 ENCOUNTER — Encounter (HOSPITAL_BASED_OUTPATIENT_CLINIC_OR_DEPARTMENT_OTHER): Payer: Self-pay

## 2019-11-07 ENCOUNTER — Other Ambulatory Visit: Payer: Self-pay

## 2019-11-07 ENCOUNTER — Encounter: Payer: Self-pay | Admitting: Family

## 2019-11-07 VITALS — BP 130/61 | HR 78 | Temp 98.7°F | Resp 16 | Wt 108.0 lb

## 2019-11-07 DIAGNOSIS — Z122 Encounter for screening for malignant neoplasm of respiratory organs: Secondary | ICD-10-CM

## 2019-11-07 DIAGNOSIS — J209 Acute bronchitis, unspecified: Secondary | ICD-10-CM

## 2019-11-07 DIAGNOSIS — Z1231 Encounter for screening mammogram for malignant neoplasm of breast: Secondary | ICD-10-CM | POA: Insufficient documentation

## 2019-11-07 DIAGNOSIS — J440 Chronic obstructive pulmonary disease with acute lower respiratory infection: Secondary | ICD-10-CM

## 2019-11-07 DIAGNOSIS — J441 Chronic obstructive pulmonary disease with (acute) exacerbation: Secondary | ICD-10-CM

## 2019-11-07 DIAGNOSIS — J449 Chronic obstructive pulmonary disease, unspecified: Secondary | ICD-10-CM

## 2019-11-07 MED ORDER — BUDESONIDE-FORMOTEROL FUMARATE 160-4.5 MCG/ACT IN AERO: 2.0000 | INHALATION_SPRAY | Freq: Two times a day (BID) | RESPIRATORY_TRACT | 5 refills | Status: DC

## 2019-11-07 MED FILL — SYMBICORT 160-4.5 MCG INH: 160-4.5 | 30 days supply | Qty: 10 | Fill #0

## 2019-11-07 NOTE — Progress Notes (Signed)
Subjective:    Patient ID: Taylor Atkins, female    DOB: 23-Dec-1955, 64 y.o.   MRN: [id number]  HPI  Patient is 64 yr old female who presents today to discuss cough.  We last saw her on 10/30/19. We treated her with a prednisone taper and zpak.  She reprts that the prednisone did not "take effect until I was almost done with it." Notes that she still has some chest congestion.  Trying to take it easy. Overall symptoms are improving.  Past Medical History:  Diagnosis Date  . Acne   . Anxiety   . Goiter   . Osteopenia   . Skin cancer 6/15  . Thyroid disease   . Tobacco use disorder      Social History   Socioeconomic History  . Marital status: Married    Spouse name: Not on file  . Number of children: Not on file  . Years of education: Not on file  . Highest education level: Not on file  Occupational History  . Not on file  Tobacco Use  . Smoking status: Current Every Day Smoker    Years: 40.00    Types: Cigarettes    Last attempt to quit: 01/13/2016    Years since quitting: 3.8  . Smokeless tobacco: Never Used  . Tobacco comment: 5 CIGARETTES DAILY  Substance and Sexual Activity  . Alcohol use: Yes    Alcohol/week: 0.0 standard drinks    Comment: seldom  . Drug use: No  . Sexual activity: Yes  Other Topics Concern  . 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 Difficulty of Paying Living Expenses: Not on file  Food Insecurity:   . Worried About Charity fundraiser in the Last Year: Not on file  . Ran Out of Food in the Last Year: Not on file  Transportation Needs:   . Lack of Transportation (Medical): Not on file  . Lack of Transportation (Non-Medical): Not on file  Physical Activity:   . Days of Exercise per Week: Not on file  . Minutes of Exercise per  Session: Not on file  Stress:   . Feeling of Stress : Not on file  Social Connections:   . Frequency of Communication with Friends and Family: Not on file  . Frequency of Social Gatherings with Friends and Family: Not on file  . Attends Religious Services: Not on file  . Active Member of Clubs or Organizations: Not on file  . Attends Archivist Meetings: Not on file  . Marital Status: Not on file  Intimate Partner Violence:   . Fear of Current or Ex-Partner: Not on file  . Emotionally Abused: Not on file  . Physically Abused: Not on file  . Sexually Abused: Not on file    Past Surgical History:  Procedure Laterality Date  . [place]  . [place]  . EXCISION MASS UPPER EXTREMETIES     [facility]M  . [place]  . TUBAL LIGATION  1993    Family History  Problem Relation Age of Onset  . Hypertension Mother        Afib, living  . Heart disease Father  Afib, pacemaker, PVD  . Hypertension Father   . Diabetes Brother   . Hypertension Brother   . Diabetes Brother   . Hypertension Brother   . Colon cancer Neg Hx     Allergies  Allergen Reactions  . Fosamax [Alendronate Sodium] Other (See Comments)    Extreme nausea    Current Outpatient Medications on File Prior to Visit  Medication Sig Dispense Refill  . albuterol (VENTOLIN HFA) 108 (90 Base) MCG/ACT inhaler Inhale 2 puffs into the lungs every 6 (six) hours as needed for wheezing or shortness of breath. 6.7 g 1  . Calcium Carbonate-Vitamin D 600-400 MG-UNIT per chew tablet Chew 1 tablet by mouth 2 (two) times daily.    Marland Kitchen escitalopram (LEXAPRO) 10 MG tablet TAKE 1/2 TABLET BY MOUTH DAILY 45 tablet 0  . ibandronate (BONIVA) 150 MG tablet Take 1 tablet (150 mg total) by mouth every 30 (thirty) days. Take in the morning with a full glass of water, on an empty stomach, and do not take anything else by mouth or lie down for the next 30 min. 3 tablet 4  . meclizine (ANTIVERT)  25 MG tablet Take one tablet every 8 hours as needed for dizziness 30 tablet 0  . meloxicam (MOBIC) 7.5 MG tablet TAKE 1 TABLET BY MOUTH  DAILY AS NEEDED FOR PAIN 90 tablet 1  . traZODone (DESYREL) 50 MG tablet TAKE ONE-HALF TO ONE TABLET BY MOUTH AT BEDTIME AS NEEDED FOR SLEEP 90 tablet 0  . [DISCONTINUED] buPROPion (WELLBUTRIN XL) 300 MG 24 hr tablet Take 1 tablet (300 mg total) by mouth daily. (Patient not taking: Reported on 06/16/2019) 30 tablet 5   No current facility-administered medications on file prior to visit.    BP 130/61 (BP Location: Right Arm, Patient Position: Sitting, Cuff Size: Small)   Pulse 78   Temp 98.7 F (37.1 C) (Oral)   Resp 16   Wt 108 lb (49 kg)   SpO2 98%   BMI 19.13 kg/m      Review of Systems See HPI  Past Medical History:  Diagnosis Date  . Acne   . Anxiety   . Goiter   . Osteopenia   . Skin cancer 6/15  . Thyroid disease   . Tobacco use disorder      Social History   Socioeconomic History  . Marital status: Married    Spouse name: Not on file  . Number of children: Not on file  . Years of education: Not on file  . Highest education level: Not on file  Occupational History  . Not on file  Tobacco Use  . Smoking status: Current Every Day Smoker    Years: 40.00    Types: Cigarettes    Last attempt to quit: 01/13/2016    Years since quitting: 3.8  . Smokeless tobacco: Never Used  . Tobacco comment: 5 CIGARETTES DAILY  Substance and Sexual Activity  . Alcohol use: Yes    Alcohol/week: 0.0 standard drinks    Comment: seldom  . Drug use: No  . Sexual activity: Yes  Other Topics Concern  . 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 Difficulty of Paying Living Expenses: Not on file  Food Insecurity:   .  Worried About Ship broker in the Last Year: Not on file  . Ran Out of Food in the Last Year: Not on file  Transportation Needs:   . Lack of Transportation (Medical): Not on file  . Lack of Transportation (Non-Medical): Not on file  Physical Activity:   . Days of Exercise per Week: Not on file  . Minutes of Exercise per Session: Not on file  Stress:   . Feeling of Stress : Not on file  Social Connections:   . Frequency of Communication with Friends and Family: Not on file  . Frequency of Social Gatherings with Friends and Family: Not on file  . Attends Religious Services: Not on file  . Active Member of Clubs or Organizations: Not on file  . Attends Archivist Meetings: Not on file  . Marital Status: Not on file  Intimate Partner Violence:   . Fear of Current or Ex-Partner: Not on file  . Emotionally Abused: Not on file  . Physically Abused: Not on file  . Sexually Abused: Not on file    Past Surgical History:  Procedure Laterality Date  . [place]  . [place]  . EXCISION MASS UPPER EXTREMETIES     [facility]  . [place]  . TUBAL LIGATION  1993    Family History  Problem Relation Age of Onset  . Hypertension Mother        Afib, living  . Heart disease Father        Afib, pacemaker, PVD  . Hypertension Father   . Diabetes Brother   . Hypertension Brother   . Diabetes Brother   . Hypertension Brother   . Colon cancer Neg Hx     Allergies  Allergen Reactions  . Fosamax [Alendronate Sodium] Other (See Comments)    Extreme nausea    Current Outpatient Medications on File Prior to Visit  Medication Sig Dispense Refill  . albuterol (VENTOLIN HFA) 108 (90 Base) MCG/ACT inhaler Inhale 2 puffs into the lungs every 6 (six) hours as needed for wheezing or shortness of breath. 6.7 g 1  . Calcium Carbonate-Vitamin D 600-400 MG-UNIT per chew tablet Chew 1 tablet by mouth 2 (two) times daily.    Marland Kitchen escitalopram (LEXAPRO) 10 MG tablet TAKE 1/2  TABLET BY MOUTH DAILY 45 tablet 0  . ibandronate (BONIVA) 150 MG tablet Take 1 tablet (150 mg total) by mouth every 30 (thirty) days. Take in the morning with a full glass of water, on an empty stomach, and do not take anything else by mouth or lie down for the next 30 min. 3 tablet 4  . meclizine (ANTIVERT) 25 MG tablet Take one tablet every 8 hours as needed for dizziness 30 tablet 0  . meloxicam (MOBIC) 7.5 MG tablet TAKE 1 TABLET BY MOUTH  DAILY AS NEEDED FOR PAIN 90 tablet 1  . traZODone (DESYREL) 50 MG tablet TAKE ONE-HALF TO ONE TABLET BY MOUTH AT BEDTIME AS NEEDED FOR SLEEP 90 tablet 0  . [DISCONTINUED] buPROPion (WELLBUTRIN XL) 300 MG 24 hr tablet Take 1 tablet (300 mg total) by mouth daily. (Patient not taking: Reported on 06/16/2019) 30 tablet 5   No current facility-administered medications on file prior to visit.    BP 130/61 (BP Location: Right Arm, Patient Position: Sitting, Cuff Size: Small)   Pulse 78   Temp 98.7 F (37.1 C) (Oral)   Resp 16   Wt 108 lb (49  kg)   SpO2 98%   BMI 19.13 kg/m       Objective:   Physical Exam Constitutional:      Appearance: She is well-developed.  Cardiovascular:     Rate and Rhythm: Normal rate and regular rhythm.     Heart sounds: Normal heart sounds. No murmur.  Pulmonary:     Effort: Pulmonary effort is normal. No respiratory distress.     Breath sounds: Examination of the left-middle field reveals wheezing. Decreased breath sounds (decreased throughout) and wheezing present.  Psychiatric:        Behavior: Behavior normal.        Thought Content: Thought content normal.        Judgment: Judgment normal.           Assessment & Plan:  COPD with recent acute exacerbation- slowly improving. I think she would benefit from addition of symbicort and a referral to pulmonology. She was scheduled for a screening chest CT today but radiology would not perform since she was symptomatic. We changed to standard CT but insurance denied  for bronchitis.  I advised the pt to contact me in 4-6 weeks when her breathing is back to baseline and we can re-order the screening CT. Continue albuterol prn.   This visit occurred during the SARS-CoV-2 public health emergency.  Safety protocols were in place, including screening questions prior to the visit, additional usage of staff PPE, and extensive cleaning of exam room while observing appropriate contact time as indicated for disinfecting solutions.

## 2019-11-07 NOTE — Patient Instructions (Signed)
Please add symbicort twice daily. You will be contacted about your referral to Pulmonology. Let me know if your symptoms worsen or if your symptoms do not continue to improve.

## 2019-11-08 ENCOUNTER — Telehealth: Payer: Self-pay | Admitting: Family

## 2019-11-08 NOTE — Telephone Encounter (Signed)
Vaughan Basta from [facility] called stating the CT referral was denied. She stated it was denial reason was there was a same or similar referral already approved in February by Lenna Sciara

## 2019-11-08 NOTE — Telephone Encounter (Signed)
Taylor Atkins is aware, she saw patient yesterday pm

## 2019-11-18 ENCOUNTER — Other Ambulatory Visit: Payer: Self-pay | Admitting: Family

## 2019-11-18 ENCOUNTER — Encounter: Payer: Self-pay | Admitting: Family

## 2019-11-18 DIAGNOSIS — J440 Chronic obstructive pulmonary disease with acute lower respiratory infection: Secondary | ICD-10-CM

## 2019-11-18 DIAGNOSIS — J209 Acute bronchitis, unspecified: Secondary | ICD-10-CM

## 2019-11-20 DIAGNOSIS — R69 Illness, unspecified: Secondary | ICD-10-CM | POA: Diagnosis not present

## 2019-11-20 MED ORDER — TRAZODONE HCL 50 MG PO TABS: ORAL_TABLET | ORAL | 1 refills | Status: DC

## 2019-11-20 MED ORDER — IPRATROPIUM-ALBUTEROL 0.5-2.5 (3) MG/3ML IN SOLN: 3.0000 mL | Freq: Four times a day (QID) | RESPIRATORY_TRACT | 1 refills | Status: DC | PRN

## 2019-11-20 MED ORDER — ESCITALOPRAM OXALATE 10 MG PO TABS: 5.0000 mg | ORAL_TABLET | Freq: Every day | ORAL | 1 refills | Status: DC

## 2019-11-20 NOTE — Telephone Encounter (Signed)
Patient advised of refills sent (she requested rx to be changed to [facility], will contact [facility] to cancell). Rx for duoneb sent to [facility]. Rx for nebulizer faxed to [facility] in [facility].

## 2019-11-20 NOTE — Telephone Encounter (Addendum)
Trazodone and lexapro refills have been sent. I am hesitant to give her another round of prednisone so soon though since she just completed 2 rounds of prednisone. Too much prednisone can be dangerous.  Instead-  I would like to get her a nebulizer machine that she can use every 6 hours at home which I think will help.  I have pended a new rx for duonebs and nebulizer. ( Maybe we could send the nebulizer order to [facility] to have them fill through her insurance? )  Also, I don't see that her pulmonary referral has been scheduled yet. It is my hope that the pulmonary doctor can help as well with her breathing. Please ask her to call [facility] to schedule an appointment as soon as possible. [phone]. Let me know if she has trouble getting in.

## 2019-11-24 DIAGNOSIS — J440 Chronic obstructive pulmonary disease with acute lower respiratory infection: Secondary | ICD-10-CM | POA: Diagnosis not present

## 2019-12-07 MED FILL — SYMBICORT 160-4.5 MCG INH: 160-4.5 | 30 days supply | Qty: 10 | Fill #1

## 2019-12-11 ENCOUNTER — Encounter: Payer: Self-pay | Admitting: Family

## 2019-12-14 ENCOUNTER — Institutional Professional Consult (permissible substitution): Payer: Medicare HMO | Admitting: Critical Care Medicine

## 2019-12-14 ENCOUNTER — Other Ambulatory Visit: Payer: Self-pay

## 2019-12-14 NOTE — Telephone Encounter (Signed)
Medication wa sent to [facility] on 11-13-19, patient requesting a change of pharmacy. Medication sent to [facility] at her request

## 2020-01-08 MED FILL — SYMBICORT 160-4.5 MCG INH: 160-4.5 | 30 days supply | Qty: 10 | Fill #2

## 2020-01-11 ENCOUNTER — Encounter: Payer: Self-pay | Admitting: Family

## 2020-01-11 DIAGNOSIS — Z720 Tobacco use: Secondary | ICD-10-CM

## 2020-01-15 ENCOUNTER — Ambulatory Visit (HOSPITAL_BASED_OUTPATIENT_CLINIC_OR_DEPARTMENT_OTHER)
Admission: RE | Admit: 2020-01-15 | Discharge: 2020-01-15 | Disposition: A | Discharge status: Home / Self Care | Payer: Medicare HMO | Source: Ambulatory Visit | Attending: Family | Admitting: Family

## 2020-01-15 ENCOUNTER — Other Ambulatory Visit: Payer: Self-pay

## 2020-01-15 DIAGNOSIS — J439 Emphysema, unspecified: Secondary | ICD-10-CM | POA: Diagnosis not present

## 2020-01-15 DIAGNOSIS — Z122 Encounter for screening for malignant neoplasm of respiratory organs: Secondary | ICD-10-CM | POA: Insufficient documentation

## 2020-01-15 DIAGNOSIS — R69 Illness, unspecified: Secondary | ICD-10-CM | POA: Diagnosis not present

## 2020-01-15 DIAGNOSIS — F17210 Nicotine dependence, cigarettes, uncomplicated: Secondary | ICD-10-CM | POA: Insufficient documentation

## 2020-01-15 DIAGNOSIS — I700 Atherosclerosis of aorta: Secondary | ICD-10-CM | POA: Diagnosis not present

## 2020-01-15 DIAGNOSIS — Z720 Tobacco use: Secondary | ICD-10-CM

## 2020-01-26 DIAGNOSIS — J449 Chronic obstructive pulmonary disease, unspecified: Secondary | ICD-10-CM | POA: Diagnosis not present

## 2020-01-26 DIAGNOSIS — R69 Illness, unspecified: Secondary | ICD-10-CM | POA: Diagnosis not present

## 2020-02-06 DIAGNOSIS — J449 Chronic obstructive pulmonary disease, unspecified: Secondary | ICD-10-CM | POA: Diagnosis not present

## 2020-02-07 ENCOUNTER — Ambulatory Visit (INDEPENDENT_AMBULATORY_CARE_PROVIDER_SITE_OTHER): Payer: Medicare HMO | Admitting: Family

## 2020-02-07 ENCOUNTER — Other Ambulatory Visit: Payer: Self-pay

## 2020-02-07 ENCOUNTER — Encounter: Payer: Self-pay | Admitting: Family

## 2020-02-07 VITALS — BP 97/57 | HR 70 | Temp 97.7°F | Resp 16 | Ht 63.0 in | Wt 112.0 lb

## 2020-02-07 DIAGNOSIS — J440 Chronic obstructive pulmonary disease with acute lower respiratory infection: Secondary | ICD-10-CM | POA: Diagnosis not present

## 2020-02-07 DIAGNOSIS — E0590 Thyrotoxicosis, unspecified without thyrotoxic crisis or storm: Secondary | ICD-10-CM | POA: Diagnosis not present

## 2020-02-07 DIAGNOSIS — E042 Nontoxic multinodular goiter: Secondary | ICD-10-CM | POA: Diagnosis not present

## 2020-02-07 DIAGNOSIS — M810 Age-related osteoporosis without current pathological fracture: Secondary | ICD-10-CM

## 2020-02-07 DIAGNOSIS — Z87891 Personal history of nicotine dependence: Secondary | ICD-10-CM

## 2020-02-07 LAB — TSH: TSH: 0.36 u[IU]/mL (ref 0.35–4.50)

## 2020-02-07 NOTE — Progress Notes (Signed)
Subjective:    Patient ID: Taylor Atkins, female    DOB: [birth year], 64 y.o.   MRN: [id number]  HPI  Patient is a 64 yr old female who presents today for follow up.  Osteoporosis- maintained on boniva, calcium.  Last bone density 11/29/17.  COPD- ongoing tobacco abuse. Had PFT's. She is taking omeprazole bid per pulmonology for possible gerd symptoms due to hoarseness. She had a negative screen CT chest 5/21.    Hyperthyroid/multinodular goiter-was following with endo. Denies diarrhea. Last visit with endo it was decided she would follow up PRN.   Wt Readings from Last 3 Encounters:  02/07/20 112 lb (50.8 kg)  11/07/19 108 lb (49 kg)  10/30/19 103 lb 6.4 oz (46.9 kg)    Lab Results  Component Value Date   TSH 0.33 (L) 06/08/2018   Anxiety- continues lexapro. Reports that anxiety has been well controled.  Tobacco abuse- states she knows she needs to quit "but I don't want to."    Review of Systems See HPI  Past Medical History:  Diagnosis Date  . Acne   . Anxiety   . Goiter   . Osteopenia   . Skin cancer 6/15  . Thyroid disease   . Tobacco use disorder      Social History   Socioeconomic History  . Marital status: Married    Spouse name: Not on file  . Number of children: Not on file  . Years of education: Not on file  . Highest education level: Not on file  Occupational History  . Not on file  Tobacco Use  . Smoking status: Current Every Day Smoker    Years: 40.00    Types: Cigarettes    Last attempt to quit: 01/13/2016    Years since quitting: 4.0  . Smokeless tobacco: Never Used  . Tobacco comment: 5 CIGARETTES DAILY  Substance and Sexual Activity  . Alcohol use: Yes    Alcohol/week: 0.0 standard drinks    Comment: seldom  . Drug use: No  . Sexual activity: Yes  Other Topics Concern  . 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 Difficulty of Paying Living Expenses:   Food Insecurity:   . Worried About Charity fundraiser in the Last Year:   . Arboriculturist in the Last Year:   Transportation Needs:   . Film/video editor (Medical):   Marland Kitchen Lack of Transportation (Non-Medical):   Physical Activity:   . Days of Exercise per Week:   . Minutes of Exercise per Session:   Stress:   . Feeling of Stress :   Social Connections:   . Frequency of Communication with Friends and Family:   . Frequency of Social Gatherings with Friends and Family:   . Attends Religious Services:   . Active Member of Clubs or Organizations:   . Attends Archivist Meetings:   Marland Kitchen Marital Status:   Intimate Partner Violence:   . Fear of Current or Ex-Partner:   . Emotionally Abused:   Marland Kitchen Physically Abused:   . Sexually Abused:     Past Surgical History:  Procedure Laterality Date  . [place]  . [place],  1993  . EXCISION MASS UPPER EXTREMETIES     AVM  . [place]  . TUBAL LIGATION  1993    Family History  Problem Relation Age of Onset  . Hypertension Mother        Afib, living  . Heart disease Father        Afib, pacemaker, PVD  . Hypertension Father   . Diabetes Brother   . Hypertension Brother   . Diabetes Brother   . Hypertension Brother   . Colon cancer Neg Hx     Allergies  Allergen Reactions  . Fosamax [Alendronate Sodium] Other (See Comments)    Extreme nausea    Current Outpatient Medications on File Prior to Visit  Medication Sig Dispense Refill  . albuterol (VENTOLIN HFA) 108 (90 Base) MCG/ACT inhaler Inhale 2 puffs into the lungs every 6 (six) hours as needed for wheezing or shortness of breath. 6.7 g 1  . budesonide-formoterol (SYMBICORT) 160-4.5 MCG/ACT inhaler Inhale 2 puffs into the lungs 2 (two) times daily. 1 Inhaler 5  . Calcium Carbonate-Vitamin D 600-400  MG-UNIT per chew tablet Chew 1 tablet by mouth 2 (two) times daily.    Marland Kitchen escitalopram (LEXAPRO) 10 MG tablet Take 0.5 tablets (5 mg total) by mouth daily. 45 tablet 1  . ibandronate (BONIVA) 150 MG tablet Take 1 tablet (150 mg total) by mouth every 30 (thirty) days. Take in the morning with a full glass of water, on an empty stomach, and do not take anything else by mouth or lie down for the next 30 min. 3 tablet 4  . ipratropium-albuterol (DUONEB) 0.5-2.5 (3) MG/3ML SOLN Take 3 mLs by nebulization every 6 (six) hours as needed. 360 mL 1  . meclizine (ANTIVERT) 25 MG tablet Take one tablet every 8 hours as needed for dizziness 30 tablet 0  . meloxicam (MOBIC) 7.5 MG tablet TAKE 1 TABLET BY MOUTH  DAILY AS NEEDED FOR PAIN 90 tablet 1  . traZODone (DESYREL) 50 MG tablet TAKE ONE-HALF TO ONE TABLET BY MOUTH AT BEDTIME AS NEEDED FOR SLEEP 90 tablet 1  . [DISCONTINUED] buPROPion (WELLBUTRIN XL) 300 MG 24 hr tablet Take 1 tablet (300 mg total) by mouth daily. (Patient not taking: Reported on 06/16/2019) 30 tablet 5   No current facility-administered medications on file prior to visit.    BP (!) 97/57 (BP Location: Right Arm, Patient Position: Sitting, Cuff Size: Small)   Pulse 70   Temp 97.7 F (36.5 C) (Temporal)   Resp 16   Ht 5\' 3"  (1.6 m)   Wt 112 lb (50.8 kg)   SpO2 96%   BMI 19.84 kg/m       Objective:   Physical Exam Constitutional:      Appearance: She is well-developed.  Neck:     Thyroid: Thyromegaly (mild) present.  Cardiovascular:     Rate and Rhythm: Normal rate and regular rhythm.     Heart sounds: Normal heart sounds. No murmur.  Pulmonary:     Effort: Pulmonary effort is normal. No respiratory distress.     Breath sounds: Normal breath sounds. No wheezing.  Musculoskeletal:     Cervical back: Neck supple.  Skin:    General: Skin is warm and dry.  Neurological:     Mental Status: She is alert and oriented to person, place, and time.  Psychiatric:        Behavior:  Behavior normal.        Thought Content: Thought content  normal.        Judgment: Judgment normal.           Assessment & Plan:  Tobacco abuse- not ready to quit. I asked her to let me know when she is ready.  COPD- fair control- now being followed by pulmonology.    Osteoporosis- Continue boniva and calcium supplement. She is due for a follow up bone density.  Anxiety- stable on lexapro, continue same.  Hyperthyroid- obtain follow up TSH.   This visit occurred during the SARS-CoV-2 public health emergency.  Safety protocols were in place, including screening questions prior to the visit, additional usage of staff PPE, and extensive cleaning of exam room while observing appropriate contact time as indicated for disinfecting solutions.

## 2020-02-12 DIAGNOSIS — J449 Chronic obstructive pulmonary disease, unspecified: Secondary | ICD-10-CM | POA: Diagnosis not present

## 2020-02-12 DIAGNOSIS — R69 Illness, unspecified: Secondary | ICD-10-CM | POA: Diagnosis not present

## 2020-02-15 ENCOUNTER — Ambulatory Visit (HOSPITAL_BASED_OUTPATIENT_CLINIC_OR_DEPARTMENT_OTHER)
Admission: RE | Admit: 2020-02-15 | Discharge: 2020-02-15 | Disposition: A | Discharge status: Home / Self Care | Payer: Medicare HMO | Source: Ambulatory Visit | Attending: Family | Admitting: Family

## 2020-02-15 ENCOUNTER — Other Ambulatory Visit: Payer: Self-pay

## 2020-02-15 DIAGNOSIS — M85852 Other specified disorders of bone density and structure, left thigh: Secondary | ICD-10-CM | POA: Diagnosis not present

## 2020-02-15 DIAGNOSIS — M810 Age-related osteoporosis without current pathological fracture: Secondary | ICD-10-CM | POA: Diagnosis not present

## 2020-02-15 DIAGNOSIS — Z780 Asymptomatic menopausal state: Secondary | ICD-10-CM | POA: Diagnosis not present

## 2020-02-26 DIAGNOSIS — D0461 Carcinoma in situ of skin of right upper limb, including shoulder: Secondary | ICD-10-CM | POA: Diagnosis not present

## 2020-02-26 DIAGNOSIS — L700 Acne vulgaris: Secondary | ICD-10-CM | POA: Diagnosis not present

## 2020-03-12 ENCOUNTER — Encounter: Payer: Self-pay | Admitting: Family

## 2020-03-21 DIAGNOSIS — C44612 Basal cell carcinoma of skin of right upper limb, including shoulder: Secondary | ICD-10-CM | POA: Diagnosis not present

## 2020-03-21 MED FILL — CEPHALEXIN 500 MG CAPSULE: 500 | 4 days supply | Qty: 12 | Fill #0

## 2020-05-09 ENCOUNTER — Other Ambulatory Visit: Payer: Self-pay | Admitting: Family

## 2020-05-15 DIAGNOSIS — H2513 Age-related nuclear cataract, bilateral: Secondary | ICD-10-CM | POA: Diagnosis not present

## 2020-05-15 DIAGNOSIS — D3131 Benign neoplasm of right choroid: Secondary | ICD-10-CM | POA: Diagnosis not present

## 2020-05-17 DIAGNOSIS — H5203 Hypermetropia, bilateral: Secondary | ICD-10-CM | POA: Diagnosis not present

## 2020-05-17 DIAGNOSIS — H524 Presbyopia: Secondary | ICD-10-CM | POA: Diagnosis not present

## 2020-05-29 ENCOUNTER — Other Ambulatory Visit: Payer: Self-pay | Admitting: Family

## 2020-06-07 ENCOUNTER — Ambulatory Visit: Payer: Medicare HMO | Attending: Internal Medicine

## 2020-06-07 ENCOUNTER — Other Ambulatory Visit (HOSPITAL_BASED_OUTPATIENT_CLINIC_OR_DEPARTMENT_OTHER): Payer: Self-pay | Admitting: Internal Medicine

## 2020-06-07 DIAGNOSIS — Z23 Encounter for immunization: Secondary | ICD-10-CM

## 2020-06-07 NOTE — Progress Notes (Signed)
   Covid-19 Vaccination Clinic  Name:  Taylor Atkins    MRN: [id number] DOB: [birth year]  06/07/2020  [name] was observed post Covid-19 immunization for 15 minutes without incident. She was provided with Vaccine Information Sheet and instruction to access the V-Safe system. Vaccinated By: Sabra Heck  [name] was instructed to call 911 with any severe reactions post vaccine: Marland Kitchen Difficulty breathing  . Swelling of face and throat  . A fast heartbeat  . A bad rash all over body  . Dizziness and weakness

## 2020-06-12 DIAGNOSIS — R69 Illness, unspecified: Secondary | ICD-10-CM | POA: Diagnosis not present

## 2020-06-20 DIAGNOSIS — C44612 Basal cell carcinoma of skin of right upper limb, including shoulder: Secondary | ICD-10-CM | POA: Diagnosis not present

## 2020-06-20 DIAGNOSIS — C44719 Basal cell carcinoma of skin of left lower limb, including hip: Secondary | ICD-10-CM | POA: Diagnosis not present

## 2020-06-20 DIAGNOSIS — C44311 Basal cell carcinoma of skin of nose: Secondary | ICD-10-CM | POA: Diagnosis not present

## 2020-06-27 DIAGNOSIS — R69 Illness, unspecified: Secondary | ICD-10-CM | POA: Diagnosis not present

## 2020-07-01 DIAGNOSIS — R69 Illness, unspecified: Secondary | ICD-10-CM | POA: Diagnosis not present

## 2020-07-04 DIAGNOSIS — C44321 Squamous cell carcinoma of skin of nose: Secondary | ICD-10-CM | POA: Diagnosis not present

## 2020-07-17 ENCOUNTER — Other Ambulatory Visit (HOSPITAL_BASED_OUTPATIENT_CLINIC_OR_DEPARTMENT_OTHER): Payer: Self-pay | Admitting: Family Medicine

## 2020-07-17 DIAGNOSIS — C44729 Squamous cell carcinoma of skin of left lower limb, including hip: Secondary | ICD-10-CM | POA: Diagnosis not present

## 2020-07-18 MED FILL — DOXYCYCLINE HYCLATE 100 MG: 100 | 30 days supply | Qty: 30 | Fill #0

## 2020-07-29 DIAGNOSIS — C44311 Basal cell carcinoma of skin of nose: Secondary | ICD-10-CM | POA: Diagnosis not present

## 2020-08-12 ENCOUNTER — Ambulatory Visit: Payer: Medicare HMO | Admitting: Family

## 2020-08-14 ENCOUNTER — Other Ambulatory Visit (HOSPITAL_BASED_OUTPATIENT_CLINIC_OR_DEPARTMENT_OTHER): Payer: Self-pay | Admitting: Family Medicine

## 2020-08-14 MED FILL — DOXYCYCLINE HYCLATE 100 MG: 100 | 90 days supply | Qty: 90 | Fill #0

## 2020-08-21 ENCOUNTER — Encounter: Payer: Self-pay | Admitting: Family

## 2020-08-21 ENCOUNTER — Other Ambulatory Visit: Payer: Self-pay

## 2020-08-21 ENCOUNTER — Ambulatory Visit (INDEPENDENT_AMBULATORY_CARE_PROVIDER_SITE_OTHER): Payer: Medicare HMO | Admitting: Family

## 2020-08-21 VITALS — BP 109/60 | HR 88 | Temp 99.1°F | Resp 16 | Ht 63.0 in | Wt 114.0 lb

## 2020-08-21 DIAGNOSIS — J449 Chronic obstructive pulmonary disease, unspecified: Secondary | ICD-10-CM | POA: Diagnosis not present

## 2020-08-21 DIAGNOSIS — R69 Illness, unspecified: Secondary | ICD-10-CM | POA: Diagnosis not present

## 2020-08-21 DIAGNOSIS — F419 Anxiety disorder, unspecified: Secondary | ICD-10-CM | POA: Diagnosis not present

## 2020-08-21 DIAGNOSIS — G4700 Insomnia, unspecified: Secondary | ICD-10-CM | POA: Diagnosis not present

## 2020-08-21 DIAGNOSIS — M810 Age-related osteoporosis without current pathological fracture: Secondary | ICD-10-CM

## 2020-08-21 DIAGNOSIS — Z23 Encounter for immunization: Secondary | ICD-10-CM | POA: Diagnosis not present

## 2020-08-21 NOTE — Progress Notes (Signed)
Subjective:    Patient ID: Taylor Atkins, female    DOB: [birth year], 64 y.o.   MRN: [id number]  HPI  Patient is a 64 yr old female who presents today for follow up.  COPD- breathing is "so so." She is following with pulmonology who is prescribing Stiolto.    Osteoporosis- last dexa was done in June. Maintained on Boniva.    Anxiety-Reports good mood/stable anxiety on lexapro 10mg .   Insomnia- continues trazodone with good improvement.   Dermatology is prescribing doxycycline for acne.  She has had some basal and squamous cell removal.   Flu shot today. Pneumovax 23   Review of Systems  See HPI  Past Medical History:  Diagnosis Date  . Acne   . Anxiety   . Basal cell carcinoma 2021   right forearm  . Goiter   . Osteopenia   . Skin cancer 6/15  . Squamous cell carcinoma in situ 2021   left shin  . Thyroid disease   . Tobacco use disorder      Social History   Socioeconomic History  . Marital status: Married    Spouse name: Not on file  . Number of children: Not on file  . Years of education: Not on file  . Highest education level: Not on file  Occupational History  . Not on file  Tobacco Use  . Smoking status: Current Every Day Smoker    Years: 40.00    Types: Cigarettes    Last attempt to quit: 01/13/2016    Years since quitting: 4.6  . Smokeless tobacco: Never Used  . Tobacco comment: 5 CIGARETTES DAILY  Substance and Sexual Activity  . Alcohol use: Yes    Alcohol/week: 0.0 standard drinks    Comment: seldom  . Drug use: No  . Sexual activity: Yes  Other Topics Concern  . 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 [place]  . [place]  . EXCISION MASS UPPER EXTREMETIES     AVM  . [place]  . TUBAL LIGATION  1993    Family History  Problem Relation Age of Onset  . Hypertension Mother        Afib, living  . Heart disease Father        Afib, pacemaker, PVD  . Hypertension Father   . Diabetes Brother   . Hypertension Brother   . Diabetes Brother   . Hypertension Brother   . Colon cancer Neg Hx     Allergies  Allergen Reactions  . Fosamax [Alendronate Sodium] Other (See Comments)    Extreme nausea    Current Outpatient Medications on File Prior to Visit  Medication Sig Dispense Refill  . albuterol (VENTOLIN HFA) 108 (90 Base) MCG/ACT inhaler Inhale 2 puffs into the lungs every 6 (six) hours as needed for wheezing or shortness of breath. 6.7 g 1  . Calcium Carbonate-Vitamin D  600-400 MG-UNIT per chew tablet Chew 1 tablet by mouth 2 (two) times daily.    Marland Kitchen doxycycline (VIBRAMYCIN) 100 MG capsule Take 100 mg by mouth 2 (two) times daily.    Marland Kitchen escitalopram (LEXAPRO) 10 MG tablet TAKE 1/2 TABLET BY MOUTH DAILY 45 tablet 1  . ibandronate (BONIVA) 150 MG tablet Take 1 tablet (150 mg total) by mouth every 30 (thirty) days. Take in the morning with a full glass of water, on an empty stomach, and do not take anything else by mouth or lie down for the next 30 min. 3 tablet 4  . ipratropium-albuterol (DUONEB) 0.5-2.5 (3) MG/3ML SOLN Take 3 mLs by nebulization every 6 (six) hours as needed. 360 mL 1  . meclizine (ANTIVERT) 25 MG tablet Take one tablet every 8 hours as needed for dizziness 30 tablet 0  . meloxicam (MOBIC) 7.5 MG tablet TAKE 1 TABLET BY MOUTH  DAILY AS NEEDED FOR PAIN 90 tablet 1  . Tiotropium Bromide-Olodaterol (STIOLTO RESPIMAT) 2.5-2.5 MCG/ACT AERS Inhale into the lungs.    . traZODone (DESYREL) 50 MG tablet  TAKE 1/2 TO 1 TABLET BY MOUTH EVERY NIGHT AT BEDTIME AS NEEDED FOR SLEEP 90 tablet 1  . [DISCONTINUED] buPROPion (WELLBUTRIN XL) 300 MG 24 hr tablet Take 1 tablet (300 mg total) by mouth daily. (Patient not taking: Reported on 06/16/2019) 30 tablet 5   No current facility-administered medications on file prior to visit.    BP 109/60 (BP Location: Right Arm, Patient Position: Sitting, Cuff Size: Small)   Pulse 88   Temp 99.1 F (37.3 C) (Oral)   Resp 16   Ht 5\' 3"  (1.6 m)   Wt 114 lb (51.7 kg)   SpO2 95%   BMI 20.19 kg/m        Objective:   Physical Exam Constitutional:      Appearance: She is well-developed and well-nourished.  Neck:     Thyroid: No thyromegaly.  Cardiovascular:     Rate and Rhythm: Normal rate and regular rhythm.     Heart sounds: Normal heart sounds. No murmur heard.   Pulmonary:     Effort: Pulmonary effort is normal. No respiratory distress.     Breath sounds: Normal breath sounds. No wheezing.  Musculoskeletal:     Cervical back: Neck supple.  Skin:    General: Skin is warm and dry.  Neurological:     Mental Status: She is alert and oriented to person, place, and time.  Psychiatric:        Mood and Affect: Mood and affect normal.        Behavior: Behavior normal.        Thought Content: Thought content normal.        Judgment: Judgment normal.           Assessment & Plan:  COPD- fair control. Management per pulmonology.  Osteoporosis- dexa up to date. Continue boniva and calcium.  Anxiety- stable continue lexapro 10mg  once daily.  Insomnia- stable, continue trazodone 50mg  once daily.  Flu shot and pneumovax today.  This visit occurred during the SARS-CoV-2 public health emergency.  Safety protocols were in place, including screening questions prior to the visit, additional usage of staff PPE, and extensive cleaning of exam room while observing appropriate contact time as indicated for disinfecting solutions.

## 2020-09-11 ENCOUNTER — Other Ambulatory Visit: Payer: Self-pay

## 2020-09-11 ENCOUNTER — Ambulatory Visit (INDEPENDENT_AMBULATORY_CARE_PROVIDER_SITE_OTHER): Payer: Medicare HMO | Admitting: Family

## 2020-09-11 ENCOUNTER — Encounter: Payer: Self-pay | Admitting: Family

## 2020-09-11 VITALS — BP 105/59 | HR 75 | Temp 98.6°F | Resp 16 | Ht 63.0 in | Wt 114.0 lb

## 2020-09-11 DIAGNOSIS — R2232 Localized swelling, mass and lump, left upper limb: Secondary | ICD-10-CM | POA: Diagnosis not present

## 2020-09-11 NOTE — Patient Instructions (Signed)
You should be contacted about [facility]. We will contact you with your results.

## 2020-09-11 NOTE — Progress Notes (Signed)
Subjective:    Patient ID: Taylor Atkins, female    DOB: 22-Aug-1956, 65 y.o.   MRN: [id number]  HPI  Patient is a 65 yr old female who presents today with chief complaint of mass in her left axilla. She first noticed about 3 weeks ago. Mass is non-tender and has not changed in size. She had her covid booster >1 month ago and her mammogram is up to date.  Review of Systems See HPI  Past Medical History:  Diagnosis Date  . Acne   . Anxiety   . Basal cell carcinoma 2021   right forearm  . Goiter   . Osteopenia   . Skin cancer 6/15  . Squamous cell carcinoma in situ 2021   left shin  . Thyroid disease   . Tobacco use disorder      Social History   Socioeconomic History  . Marital status: Married    Spouse name: Not on file  . Number of children: Not on file  . Years of education: Not on file  . Highest education level: Not on file  Occupational History  . Not on file  Tobacco Use  . Smoking status: Current Every Day Smoker    Years: 40.00    Types: Cigarettes    Last attempt to quit: 01/13/2016    Years since quitting: 4.6  . Smokeless tobacco: Never Used  . Tobacco comment: 5 CIGARETTES DAILY  Substance and Sexual Activity  . Alcohol use: Yes    Alcohol/week: 0.0 standard drinks    Comment: seldom  . Drug use: No  . Sexual activity: Yes  Other Topics Concern  . 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 BUNIONECTOMY  1978  . CESAREAN SECTION  1990, 1993  . EXCISION MASS UPPER EXTREMETIES     AVM   . HEMORRHOID SURGERY  1994  . TUBAL LIGATION  1993    Family History  Problem Relation Age of Onset  . Hypertension Mother        Afib, living  . Heart disease Father        Afib, pacemaker, PVD  . Hypertension Father   . Diabetes Brother   . Hypertension Brother   . Diabetes Brother   . Hypertension Brother   . Colon cancer Neg Hx     Allergies  Allergen Reactions  . Fosamax [Alendronate Sodium] Other (See Comments)    Extreme nausea    Current Outpatient Medications on File Prior to Visit  Medication Sig Dispense Refill  . albuterol (VENTOLIN HFA) 108 (90 Base) MCG/ACT inhaler Inhale 2 puffs into the lungs every 6 (six) hours as needed for wheezing or shortness of breath. 6.7 g 1  . Calcium Carbonate-Vitamin D 600-400 MG-UNIT per chew tablet Chew 1 tablet by mouth 2 (two) times daily.    Marland Kitchen doxycycline (VIBRAMYCIN) 100 MG capsule Take 100 mg by mouth 2 (two) times daily.    Marland Kitchen  escitalopram (LEXAPRO) 10 MG tablet TAKE 1/2 TABLET BY MOUTH DAILY 45 tablet 1  . ibandronate (BONIVA) 150 MG tablet Take 1 tablet (150 mg total) by mouth every 30 (thirty) days. Take in the morning with a full glass of water, on an empty stomach, and do not take anything else by mouth or lie down for the next 30 min. 3 tablet 4  . ipratropium-albuterol (DUONEB) 0.5-2.5 (3) MG/3ML SOLN Take 3 mLs by nebulization every 6 (six) hours as needed. 360 mL 1  . meclizine (ANTIVERT) 25 MG tablet Take one tablet every 8 hours as needed for dizziness 30 tablet 0  . meloxicam (MOBIC) 7.5 MG tablet TAKE 1 TABLET BY MOUTH  DAILY AS NEEDED FOR PAIN 90 tablet 1  . Tiotropium Bromide-Olodaterol (STIOLTO RESPIMAT) 2.5-2.5 MCG/ACT AERS Inhale into the lungs.    . traZODone (DESYREL) 50 MG tablet TAKE 1/2 TO 1 TABLET BY MOUTH EVERY NIGHT AT BEDTIME AS NEEDED FOR SLEEP 90 tablet 1  . [DISCONTINUED] buPROPion (WELLBUTRIN XL) 300 MG 24 hr tablet Take 1 tablet (300 mg total) by mouth daily. (Patient not taking: Reported on  06/16/2019) 30 tablet 5   No current facility-administered medications on file prior to visit.    BP (!) 105/59 (BP Location: Right Arm, Patient Position: Sitting, Cuff Size: Small)   Pulse 75   Temp 98.6 F (37 C) (Oral)   Resp 16   Ht 5\' 3"  (1.6 m)   Wt 114 lb (51.7 kg)   SpO2 95%   BMI 20.19 kg/m       Objective:   Physical Exam Constitutional:      Appearance: Normal appearance.  HENT:     Head: Normocephalic and atraumatic.  Skin:    General: Skin is warm and dry.  Neurological:     Mental Status: She is alert and oriented to person, place, and time.  Psychiatric:        Attention and Perception: Attention normal.        Mood and Affect: Mood normal.        Speech: Speech normal.        Behavior: Behavior normal.   Breast: there is a prominence in the left axilla overlying the left lattisimus dorsi. Non-tender, non- mobile.          Assessment & Plan:  Axillary mass- new. Will refer for diagnostic ultrasound/mammogram for further evaluation.   This visit occurred during the SARS-CoV-2 public health emergency.  Safety protocols were in place, including screening questions prior to the visit, additional usage of staff PPE, and extensive cleaning of exam room while observing appropriate contact time as indicated for disinfecting solutions.

## 2020-10-03 DIAGNOSIS — C44729 Squamous cell carcinoma of skin of left lower limb, including hip: Secondary | ICD-10-CM | POA: Diagnosis not present

## 2020-10-03 DIAGNOSIS — C44311 Basal cell carcinoma of skin of nose: Secondary | ICD-10-CM | POA: Diagnosis not present

## 2020-10-09 DIAGNOSIS — J441 Chronic obstructive pulmonary disease with (acute) exacerbation: Secondary | ICD-10-CM | POA: Diagnosis not present

## 2020-10-09 DIAGNOSIS — R69 Illness, unspecified: Secondary | ICD-10-CM | POA: Diagnosis not present

## 2020-10-09 DIAGNOSIS — R059 Cough, unspecified: Secondary | ICD-10-CM | POA: Diagnosis not present

## 2020-10-09 DIAGNOSIS — J449 Chronic obstructive pulmonary disease, unspecified: Secondary | ICD-10-CM | POA: Diagnosis not present

## 2020-10-09 DIAGNOSIS — R0600 Dyspnea, unspecified: Secondary | ICD-10-CM | POA: Diagnosis not present

## 2020-10-14 DIAGNOSIS — J9601 Acute respiratory failure with hypoxia: Secondary | ICD-10-CM | POA: Diagnosis not present

## 2020-10-23 ENCOUNTER — Other Ambulatory Visit (HOSPITAL_BASED_OUTPATIENT_CLINIC_OR_DEPARTMENT_OTHER): Payer: Self-pay | Admitting: Physician Assistant

## 2020-10-23 DIAGNOSIS — J441 Chronic obstructive pulmonary disease with (acute) exacerbation: Secondary | ICD-10-CM | POA: Diagnosis not present

## 2020-10-23 DIAGNOSIS — R69 Illness, unspecified: Secondary | ICD-10-CM | POA: Diagnosis not present

## 2020-10-23 MED FILL — AZITHROMYCIN 250 MG TABLET: 250 | 5 days supply | Qty: 6 | Fill #0

## 2020-11-05 ENCOUNTER — Other Ambulatory Visit: Payer: Self-pay | Admitting: Family

## 2020-11-11 DIAGNOSIS — J9601 Acute respiratory failure with hypoxia: Secondary | ICD-10-CM | POA: Diagnosis not present

## 2020-11-13 ENCOUNTER — Other Ambulatory Visit: Payer: Medicare HMO

## 2020-11-28 ENCOUNTER — Ambulatory Visit
Admission: RE | Admit: 2020-11-28 | Discharge: 2020-11-28 | Disposition: A | Discharge status: Home / Self Care | Payer: Medicare HMO | Source: Ambulatory Visit | Attending: Family | Admitting: Family

## 2020-11-28 ENCOUNTER — Ambulatory Visit: Payer: Medicare HMO

## 2020-11-28 ENCOUNTER — Other Ambulatory Visit: Payer: Self-pay

## 2020-11-28 DIAGNOSIS — R922 Inconclusive mammogram: Secondary | ICD-10-CM | POA: Diagnosis not present

## 2020-11-28 DIAGNOSIS — R2232 Localized swelling, mass and lump, left upper limb: Secondary | ICD-10-CM

## 2020-12-12 DIAGNOSIS — J9601 Acute respiratory failure with hypoxia: Secondary | ICD-10-CM | POA: Diagnosis not present

## 2020-12-19 DIAGNOSIS — L570 Actinic keratosis: Secondary | ICD-10-CM | POA: Diagnosis not present

## 2020-12-19 DIAGNOSIS — D1801 Hemangioma of skin and subcutaneous tissue: Secondary | ICD-10-CM | POA: Diagnosis not present

## 2020-12-19 DIAGNOSIS — L821 Other seborrheic keratosis: Secondary | ICD-10-CM | POA: Diagnosis not present

## 2020-12-19 DIAGNOSIS — L720 Epidermal cyst: Secondary | ICD-10-CM | POA: Diagnosis not present

## 2020-12-19 DIAGNOSIS — C44519 Basal cell carcinoma of skin of other part of trunk: Secondary | ICD-10-CM | POA: Diagnosis not present

## 2020-12-19 DIAGNOSIS — D225 Melanocytic nevi of trunk: Secondary | ICD-10-CM | POA: Diagnosis not present

## 2021-01-02 DIAGNOSIS — C44519 Basal cell carcinoma of skin of other part of trunk: Secondary | ICD-10-CM | POA: Diagnosis not present

## 2021-01-08 DIAGNOSIS — F17210 Nicotine dependence, cigarettes, uncomplicated: Secondary | ICD-10-CM | POA: Diagnosis not present

## 2021-01-08 DIAGNOSIS — J432 Centrilobular emphysema: Secondary | ICD-10-CM | POA: Diagnosis not present

## 2021-01-08 DIAGNOSIS — R69 Illness, unspecified: Secondary | ICD-10-CM | POA: Diagnosis not present

## 2021-01-08 DIAGNOSIS — R911 Solitary pulmonary nodule: Secondary | ICD-10-CM | POA: Diagnosis not present

## 2021-01-11 DIAGNOSIS — J9601 Acute respiratory failure with hypoxia: Secondary | ICD-10-CM | POA: Diagnosis not present

## 2021-01-13 DIAGNOSIS — R69 Illness, unspecified: Secondary | ICD-10-CM | POA: Diagnosis not present

## 2021-01-13 DIAGNOSIS — R911 Solitary pulmonary nodule: Secondary | ICD-10-CM | POA: Diagnosis not present

## 2021-01-13 DIAGNOSIS — Z122 Encounter for screening for malignant neoplasm of respiratory organs: Secondary | ICD-10-CM | POA: Diagnosis not present

## 2021-01-13 DIAGNOSIS — J432 Centrilobular emphysema: Secondary | ICD-10-CM | POA: Diagnosis not present

## 2021-01-14 ENCOUNTER — Encounter: Payer: Self-pay | Admitting: Family

## 2021-02-11 DIAGNOSIS — J9601 Acute respiratory failure with hypoxia: Secondary | ICD-10-CM | POA: Diagnosis not present

## 2021-02-12 ENCOUNTER — Encounter: Payer: Self-pay | Admitting: Family

## 2021-02-12 NOTE — Telephone Encounter (Signed)
Spoke with patient and her mother goes to a different office and has answered her questions

## 2021-02-24 ENCOUNTER — Encounter: Payer: Medicare HMO | Admitting: Family

## 2021-02-26 ENCOUNTER — Encounter: Payer: Self-pay | Admitting: Family

## 2021-02-26 ENCOUNTER — Other Ambulatory Visit: Payer: Self-pay

## 2021-02-26 ENCOUNTER — Other Ambulatory Visit (HOSPITAL_BASED_OUTPATIENT_CLINIC_OR_DEPARTMENT_OTHER): Payer: Self-pay

## 2021-02-26 ENCOUNTER — Ambulatory Visit: Payer: Medicare HMO | Attending: Internal Medicine

## 2021-02-26 ENCOUNTER — Ambulatory Visit (INDEPENDENT_AMBULATORY_CARE_PROVIDER_SITE_OTHER): Payer: Medicare HMO | Admitting: Family

## 2021-02-26 VITALS — BP 111/53 | HR 81 | Temp 98.6°F | Resp 16 | Ht 63.0 in | Wt 118.8 lb

## 2021-02-26 DIAGNOSIS — R918 Other nonspecific abnormal finding of lung field: Secondary | ICD-10-CM | POA: Diagnosis not present

## 2021-02-26 DIAGNOSIS — Z87891 Personal history of nicotine dependence: Secondary | ICD-10-CM | POA: Diagnosis not present

## 2021-02-26 DIAGNOSIS — Z86010 Personal history of colonic polyps: Secondary | ICD-10-CM | POA: Diagnosis not present

## 2021-02-26 DIAGNOSIS — Z23 Encounter for immunization: Secondary | ICD-10-CM

## 2021-02-26 DIAGNOSIS — E0590 Thyrotoxicosis, unspecified without thyrotoxic crisis or storm: Secondary | ICD-10-CM

## 2021-02-26 DIAGNOSIS — R69 Illness, unspecified: Secondary | ICD-10-CM | POA: Diagnosis not present

## 2021-02-26 DIAGNOSIS — R1084 Generalized abdominal pain: Secondary | ICD-10-CM | POA: Insufficient documentation

## 2021-02-26 DIAGNOSIS — R5383 Other fatigue: Secondary | ICD-10-CM | POA: Diagnosis not present

## 2021-02-26 DIAGNOSIS — J440 Chronic obstructive pulmonary disease with acute lower respiratory infection: Secondary | ICD-10-CM | POA: Diagnosis not present

## 2021-02-26 DIAGNOSIS — Z0000 Encounter for general adult medical examination without abnormal findings: Secondary | ICD-10-CM | POA: Insufficient documentation

## 2021-02-26 DIAGNOSIS — F418 Other specified anxiety disorders: Secondary | ICD-10-CM

## 2021-02-26 LAB — COMPREHENSIVE METABOLIC PANEL
ALT: 12 U/L (ref 0–35)
AST: 18 U/L (ref 0–37)
Albumin: 4.7 g/dL (ref 3.5–5.2)
Alkaline Phosphatase: 79 U/L (ref 39–117)
BUN: 10 mg/dL (ref 6–23)
CO2: 31 mEq/L (ref 19–32)
Calcium: 9.7 mg/dL (ref 8.4–10.5)
Chloride: 103 mEq/L (ref 96–112)
Creatinine, Ser: 0.91 mg/dL (ref 0.40–1.20)
GFR: 65.38 mL/min (ref >60.00)
Glucose, Bld: 84 mg/dL (ref 70–99)
Potassium: 4.4 mEq/L (ref 3.5–5.1)
Sodium: 141 mEq/L (ref 135–145)
Total Bilirubin: 0.5 mg/dL (ref 0.2–1.2)
Total Protein: 6.8 g/dL (ref 6.0–8.3)

## 2021-02-26 LAB — CBC WITH DIFFERENTIAL/PLATELET
Basophils Absolute: 0.1 10*3/uL (ref 0.0–0.1)
Basophils Relative: 0.7 % (ref 0.0–3.0)
Eosinophils Absolute: 0.3 10*3/uL (ref 0.0–0.7)
Eosinophils Relative: 4.4 % (ref 0.0–5.0)
HCT: 41.5 % (ref 36.0–46.0)
Hemoglobin: 14 g/dL (ref 12.0–15.0)
Lymphocytes Relative: 28.1 % (ref 12.0–46.0)
Lymphs Abs: 2.2 10*3/uL (ref 0.7–4.0)
MCHC: 33.8 g/dL (ref 30.0–36.0)
MCV: 88 fl (ref 78.0–100.0)
Monocytes Absolute: 0.5 10*3/uL (ref 0.1–1.0)
Monocytes Relative: 6.4 % (ref 3.0–12.0)
Neutro Abs: 4.8 10*3/uL (ref 1.4–7.7)
Neutrophils Relative %: 60.4 % (ref 43.0–77.0)
Platelets: 220 10*3/uL (ref 150.0–400.0)
RBC: 4.71 Mil/uL (ref 3.87–5.11)
RDW: 13.5 % (ref 11.5–15.5)
WBC: 7.9 10*3/uL (ref 4.0–10.5)

## 2021-02-26 LAB — TSH: TSH: 0.12 u[IU]/mL — ABNORMAL LOW (ref 0.35–4.50)

## 2021-02-26 MED ORDER — ESCITALOPRAM OXALATE 10 MG PO TABS
10.0000 mg | ORAL_TABLET | Freq: Every day | ORAL | 1 refills | Status: DC
  Filled 2021-02-26: qty 90, 90d supply, fill #0
  Filled 2021-07-01: qty 90, 90d supply, fill #1

## 2021-02-26 NOTE — Assessment & Plan Note (Signed)
Recommended second covid booster.  Continue healthy diet. Refer for colo, pap smear- she has aged out. Mammogram up to date.

## 2021-02-26 NOTE — Progress Notes (Signed)
   Covid-19 Vaccination [facility]  Name:  Taylor Atkins    MRN: [id number] DOB: [birth year]  02/26/2021  [name] was observed post Covid-19 immunization for 15 minutes without incident. She was provided with Vaccine Information Sheet and instruction to access the V-Safe system.   [name] was instructed to call 911 with any severe reactions post vaccine: Difficulty breathing  Swelling of face and throat  A fast heartbeat  A bad rash all over body  Dizziness and weakness   Immunizations Administered     Name Date Dose VIS Date Route   PFIZER Comrnaty(Gray TOP) Covid-19 Vaccine 02/26/2021  9:54 AM 0.3 mL 08/15/2020 Intramuscular   Manufacturer: [place]   Lot: [id number]   [place]: [phone]

## 2021-02-26 NOTE — Assessment & Plan Note (Signed)
Counseled on cessation 

## 2021-02-26 NOTE — Assessment & Plan Note (Signed)
Chronic, she is limited in her activity due to symptoms. She is being managed by pulmonology at [facility].

## 2021-02-26 NOTE — Progress Notes (Addendum)
Subjective:   By signing my name below, I, Taylor Atkins, attest that this documentation has been prepared under the direction and in the presence of Debbrah Alar NP. 02/26/2021    Patient ID: Taylor Atkins, female    DOB: October 08, 1955, 65 y.o.   MRN: [id number]  Chief Complaint  Patient presents with   Annual Exam    HPI Patient is in today for a comprehensive physical exam.  She denies having any unexpected weight change, hearing loss and rhinorrhea, visual disturbance, chest pain and leg swelling, nausea, vomiting, diarrhea and blood in stool, or dysuria and frequency, for myalgias and arthralgias, rash, headaches, adenopathy, depression or anxiety at this time. She has no recent changes in her family medical history. She has no recent changes with her surgical history. She continues taking care of her mother who is diagnosed with dementia. All of her siblings are currently alive at this time. She occasionally drinks alcohol. She does not use drugs. She smokes half a pack a day. She is interested in quitting smoking but struggles to do so. She notes that Chantix was effective in helping her quit. She continues having coughs due to thick phlegm developing in her throat.   COPD- Her breathing has not improved since her last visit. During her last CT scan a mass was found. She has a follow up CT scan in the next couple of months for further evaluation. She reports for the past couple of months having rotund abdominal distension. She has no pain or issues with her bowel movements. She does have occasional cramping caused by doing unfamiliar movements and coughing.  Depression/Anxiety- She reports that she recently started feeling more tired. She stopped taking Wellbutrin and started taking 5 mg lexapro and 25-50 mg trazodone daily PO. She reports that her mood did not change while she was on Wellbutrin daily PO. She reports that she continues having episodes of  anxiety.  Immunizations: She is UTD on flu until this fall. She is UTD on tetanus vaccines. She has 3 pfizer Covid-19 vaccines at this time.  Diet: She is managing a healthy diet at this time. Exercise: She does not participate in regular exercise due to breathing issues. Colonoscopy: Last completed 09/27/2015. Results showed 2 flat polyps ranging between 5-9 mm in sigmoid colon at hepatic flexure, otherwise results normal. Repeat in 5-10 years(Due). She is willing to set up an appointment at this time. Dexa: Last completed 02/15/2020. Results show osteoporosis. Repeat in 1-2 years.  Pap Smear: Last completed 06/08/2018. Results normal.   Mammogram: Last completed 11/06/2020. Results normal. Repeat in 1 year.  Dental: She is UTD on dental care. Vision:  She is UTD on vision care.  Health Maintenance Due  Topic Date Due   COLONOSCOPY (Pts 45-15yr Insurance coverage will need to be confirmed)  09/26/2020    Past Medical History:  Diagnosis Date   Acne    Anxiety    Basal cell carcinoma 2021   right forearm   Goiter    Osteopenia    Skin cancer 6/15   Squamous cell carcinoma in situ 2021   left shin   Thyroid disease    Tobacco use disorder     Past Surgical History:  Procedure Laterality Date   [facility]     [facility]   [place]   Family History  Problem  Relation Age of Onset   Hypertension Mother        Afib, living   Dementia Mother    Heart disease Father        Afib, pacemaker, PVD   Hypertension Father    Diabetes Brother    Hypertension Brother    Diabetes Brother    Hypertension Brother    Colon cancer Neg Hx     Social History   Socioeconomic History   Marital status: Married    Spouse name: Not on file   Number of children: Not on file   Years of education: Not on file   Highest education level: Not on file  Occupational History   Not on  file  Tobacco Use   Smoking status: Every Day    Packs/day: 0.50    Years: 40.00    Pack years: 20.00    Types: Cigarettes    Last attempt to quit: 01/13/2016    Years since quitting: 5.1   Smokeless tobacco: Never  Substance and Sexual Activity   Alcohol use: Yes    Alcohol/week: 0.0 standard drinks    Comment: seldom   Drug use: No   Sexual activity: Yes  Other Topics Concern   Not on file  Social History Narrative   Married   2 sons (one in college) one about to move out   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 6.7 g 1   Calcium Carbonate-Vitamin D 600-400 MG-UNIT per chew tablet Chew 1 tablet by mouth 2 (two) times daily.     COVID-19 mRNA vaccine, Pfizer, 30 MCG/0.3ML injection AS DIRECTED .3 mL 0   ibandronate (BONIVA) 150 MG tablet Take 1 tablet (150 mg total) by mouth every 30 (thirty) days. Take in the morning with a full glass of water, on an empty stomach, and do not take anything else by mouth or lie down for the next 30 min. 3 tablet 4   ipratropium-albuterol (DUONEB) 0.5-2.5 (3) MG/3ML SOLN Take 3 mLs by nebulization every 6 (six) hours as needed. 360 mL 1   meclizine (ANTIVERT) 25 MG tablet Take one tablet every 8 hours as needed for dizziness 30 tablet 0   meloxicam (MOBIC) 7.5 MG tablet TAKE 1 TABLET BY MOUTH  DAILY AS NEEDED FOR PAIN 90 tablet 1   Tiotropium Bromide-Olodaterol (STIOLTO RESPIMAT) 2.5-2.5 MCG/ACT AERS Inhale into the lungs.      traZODone (DESYREL) 50 MG tablet Take 0.5-1 tablets (25-50 mg total) by mouth at bedtime as needed for sleep. 90 tablet 1   escitalopram (LEXAPRO) 10 MG tablet TAKE 1/2 TABLET BY MOUTH DAILY 45 tablet 1   doxycycline (VIBRA-TABS) 100 MG tablet TAKE 1 TABLET BY MOUTH ONCE DAILY AS NEEDED FOR ACNE TAKE WITH FOOD 90 tablet 4   doxycycline (VIBRA-TABS) 100 MG tablet TAKE 1 TABLET BY MOUTH ONCE DAILY WITH FOOD *DO NOT TAKE ON EMPTY STOMACH AND  DRINK FULL GLASS OF WATER* 30 tablet 0   doxycycline (VIBRAMYCIN) 100 MG capsule Take 100 mg by mouth 2 (two) times daily.     No facility-administered medications prior to visit.    Allergies  Allergen Reactions   Fosamax [Alendronate Sodium] Other (See Comments)    Extreme nausea    Review of Systems  HENT:  Negative for hearing loss.   Respiratory:  Positive for cough.   Cardiovascular:  Negative for chest pain and leg swelling.  Gastrointestinal:  Negative for blood in stool, constipation, diarrhea, nausea and vomiting.  Genitourinary:  Negative for dysuria and frequency.  Musculoskeletal:  Negative for joint pain and myalgias.  Skin:  Negative for rash.  Neurological:  Negative for headaches.  Psychiatric/Behavioral:  Positive for depression. The patient is nervous/anxious.       Objective:    Physical Exam Constitutional:      General: She is not in acute distress.    Appearance: Normal appearance. She is not ill-appearing.  HENT:     Head: Normocephalic and atraumatic.     Right Ear: Tympanic membrane, ear canal and external ear normal.     Left Ear: Tympanic membrane, ear canal and external ear normal.  Eyes:     Extraocular Movements: Extraocular movements intact.     Pupils: Pupils are equal, round, and reactive to light.     Comments: No nystagmus  Cardiovascular:     Rate and Rhythm: Normal rate and regular rhythm.     Pulses: Normal pulses.     Heart sounds: Normal heart sounds. No murmur heard.   No gallop.   Pulmonary:     Effort: Pulmonary effort is normal. No respiratory distress.     Breath sounds: Wheezing (expiratory wheeze on left side) present. No rhonchi or rales.  Abdominal:     General: Bowel sounds are normal. There is distension.     Palpations: Abdomen is soft.     Tenderness: There is no abdominal tenderness. There is no guarding or rebound.     Hernia: No hernia is present.     Comments: Muscle defect on top of abdomen. Similar to an umbilical hernia but she does not have one at this time.  Musculoskeletal:     Comments: 5/5 strength in both upper and lower extremities  Skin:    General: Skin is warm and dry.  Neurological:     Mental Status: She is alert and oriented to person, place, and time.     Deep Tendon Reflexes:     Reflex Scores:      Patellar reflexes are 2+ on the right side and 2+ on the left side. Psychiatric:        Mood and Affect: Mood is anxious. Affect is flat.        Behavior: Behavior normal.    BP (!) 111/53 (BP Location: Right Arm, Patient Position: Sitting, Cuff Size: Small)   Pulse 81   Temp 98.6 F (37 C) (Oral)   Resp 16   Ht _0  (1.6 m)   Wt 118 lb 12.8 oz (53.9 kg)   SpO2 95%   BMI 21.04 kg/m  Wt Readings from Last 3 Encounters:  02/26/21 118 lb 12.8 oz (53.9 kg)  09/11/20 114 lb (51.7 kg)  08/21/20 114 lb (51.7 kg)       Assessment & Plan:   Problem List Items Addressed This Visit       Unprioritized   Preventative health care - Primary  Recommended second covid booster.  Continue healthy diet. Refer for colo, pap smear- she has aged out. Mammogram up to date.        Lung mass    6.6 mm irregular nodule in the left upper lobe (image 118), new- per lung cancer screening.  Plan per pulmonology is a repeat CT in August that they are arranging. Pt is aware.         Hyperthyroidism   Relevant Orders   TSH (Completed)   History of tobacco abuse    Counseled on cessation.        History of colon polyps    Relevant Orders   Ambulatory referral to Gastroenterology   Generalized abdominal pain    Will obtain CT abd/pelvis to further evaluate.        Relevant Orders   CT Abdomen Pelvis W Contrast   Fatigue    Labs as ordered.  I think her anxiety and depression may be a contributing factor as well.        Relevant Orders   Comp Met (CMET) (Completed)   CBC with Differential/Platelet (Completed)   Depression with anxiety    Uncontrolled.  Will increase lexapro from 42m to 129m        Relevant Medications   escitalopram (LEXAPRO) 10 MG tablet   COPD (chronic obstructive pulmonary disease) (HCC)    Chronic, she is limited in her activity due to symptoms. [facility]  Medications   escitalopram (LEXAPRO) 10 MG tablet    Sig: Take 1 tablet (10 mg total) by mouth daily.    Dispense:  90 tablet    Refill:  1    Order Specific Question:   Supervising Provider    Answer:   BLPenni Homans [[id number]]    I, MeDebbrah AlarP, personally preformed the services described in this documentation.  All medical record entries made by the scribe were at my direction and in my presence.  I have reviewed the chart and discharge instructions (if applicable) and agree that the record reflects my personal performance and is accurate and complete. 02/26/2021   I,Taylor Atkins,acting as a scEducation administratoror MeNance PearNP.,have documented all relevant documentation on the behalf of MeNance PearNP,as directed by  MeNance PearNP while in the presence of MeNance PearNP.   MeNance PearNP

## 2021-02-26 NOTE — Assessment & Plan Note (Signed)
Will obtain CT abd/pelvis to further evaluate.

## 2021-02-26 NOTE — Assessment & Plan Note (Signed)
Uncontrolled.  Will increase lexapro from 5mg  to 10mg .

## 2021-02-26 NOTE — Assessment & Plan Note (Signed)
6.6 mm irregular nodule in the left upper lobe (image 118), new- per lung cancer screening.  Plan per pulmonology is a repeat CT in August that they are arranging. Pt is aware.

## 2021-02-26 NOTE — Patient Instructions (Addendum)
Please complete lab work prior to leaving. Please get your covid booster on the first floor pharmacy.  Please increase lexapro from 1/2 tab to full tab once daily. You should be contacted about scheduling your CT scan.

## 2021-02-26 NOTE — Assessment & Plan Note (Signed)
Labs as ordered.  I think her anxiety and depression may be a contributing factor as well.

## 2021-02-27 ENCOUNTER — Telehealth: Payer: Self-pay | Admitting: Family

## 2021-02-27 ENCOUNTER — Other Ambulatory Visit (INDEPENDENT_AMBULATORY_CARE_PROVIDER_SITE_OTHER): Payer: Medicare HMO

## 2021-02-27 ENCOUNTER — Other Ambulatory Visit (HOSPITAL_BASED_OUTPATIENT_CLINIC_OR_DEPARTMENT_OTHER): Payer: Self-pay

## 2021-02-27 DIAGNOSIS — E042 Nontoxic multinodular goiter: Secondary | ICD-10-CM

## 2021-02-27 DIAGNOSIS — E039 Hypothyroidism, unspecified: Secondary | ICD-10-CM | POA: Diagnosis not present

## 2021-02-27 LAB — T4, FREE: Free T4: 0.88 ng/dL (ref 0.60–1.60)

## 2021-02-27 LAB — T3, FREE: T3, Free: 3.7 pg/mL (ref 2.3–4.2)

## 2021-02-27 MED ORDER — COVID-19 MRNA VAC-TRIS(PFIZER) 30 MCG/0.3ML IM SUSP
INTRAMUSCULAR | 0 refills | Status: DC
  Filled 2021-02-27: qty 0.3, 1d supply, fill #0

## 2021-02-28 NOTE — Telephone Encounter (Signed)
Please contact pt and let her know that her thyroid testing is borderline low.  I would like to repeat a thyroid ultrasound to follow up on her nodules.  I would also like to see her back in 3 months for repeat lab work/office visit.

## 2021-02-28 NOTE — Telephone Encounter (Signed)
Patient advised of results and [place] order. She will follow up on scheduled appointment

## 2021-03-05 ENCOUNTER — Ambulatory Visit (HOSPITAL_BASED_OUTPATIENT_CLINIC_OR_DEPARTMENT_OTHER)
Admission: RE | Admit: 2021-03-05 | Discharge: 2021-03-05 | Disposition: A | Discharge status: Home / Self Care | Payer: Medicare HMO | Source: Ambulatory Visit | Attending: Family | Admitting: Family

## 2021-03-05 ENCOUNTER — Other Ambulatory Visit: Payer: Self-pay

## 2021-03-05 ENCOUNTER — Encounter (HOSPITAL_BASED_OUTPATIENT_CLINIC_OR_DEPARTMENT_OTHER): Payer: Self-pay

## 2021-03-05 DIAGNOSIS — E042 Nontoxic multinodular goiter: Secondary | ICD-10-CM

## 2021-03-05 DIAGNOSIS — R1084 Generalized abdominal pain: Secondary | ICD-10-CM

## 2021-03-05 MED ORDER — IOHEXOL 300 MG/ML  SOLN: 100.0000 mL | Freq: Once | INTRAMUSCULAR | Status: DC | PRN

## 2021-03-06 ENCOUNTER — Ambulatory Visit (HOSPITAL_BASED_OUTPATIENT_CLINIC_OR_DEPARTMENT_OTHER)
Admission: RE | Admit: 2021-03-06 | Discharge: 2021-03-06 | Disposition: A | Discharge status: Home / Self Care | Payer: Medicare HMO | Source: Ambulatory Visit | Attending: Family | Admitting: Family

## 2021-03-06 ENCOUNTER — Encounter (HOSPITAL_BASED_OUTPATIENT_CLINIC_OR_DEPARTMENT_OTHER): Payer: Self-pay

## 2021-03-06 DIAGNOSIS — R1084 Generalized abdominal pain: Secondary | ICD-10-CM | POA: Insufficient documentation

## 2021-03-06 DIAGNOSIS — R197 Diarrhea, unspecified: Secondary | ICD-10-CM | POA: Diagnosis not present

## 2021-03-06 DIAGNOSIS — N281 Cyst of kidney, acquired: Secondary | ICD-10-CM | POA: Diagnosis not present

## 2021-03-06 MED ORDER — IOHEXOL 300 MG/ML  SOLN
100.0000 mL | Freq: Once | INTRAMUSCULAR | Status: AC | PRN
  Administered 2021-03-06: 100 mL via INTRAVENOUS

## 2021-03-07 ENCOUNTER — Encounter: Payer: Self-pay | Admitting: Family

## 2021-03-07 DIAGNOSIS — K869 Disease of pancreas, unspecified: Secondary | ICD-10-CM

## 2021-03-07 DIAGNOSIS — Q6102 Congenital multiple renal cysts: Secondary | ICD-10-CM | POA: Insufficient documentation

## 2021-03-07 HISTORY — DX: Disease of pancreas, unspecified: K86.9

## 2021-03-11 ENCOUNTER — Encounter: Payer: Self-pay | Admitting: Gastroenterology

## 2021-03-13 DIAGNOSIS — J9601 Acute respiratory failure with hypoxia: Secondary | ICD-10-CM | POA: Diagnosis not present

## 2021-03-26 ENCOUNTER — Other Ambulatory Visit: Payer: Self-pay

## 2021-03-26 ENCOUNTER — Telehealth (INDEPENDENT_AMBULATORY_CARE_PROVIDER_SITE_OTHER): Payer: Medicare HMO | Admitting: Family

## 2021-03-26 ENCOUNTER — Encounter: Payer: Self-pay | Admitting: Family

## 2021-03-26 DIAGNOSIS — R918 Other nonspecific abnormal finding of lung field: Secondary | ICD-10-CM | POA: Diagnosis not present

## 2021-03-26 DIAGNOSIS — E042 Nontoxic multinodular goiter: Secondary | ICD-10-CM | POA: Diagnosis not present

## 2021-03-26 DIAGNOSIS — J069 Acute upper respiratory infection, unspecified: Secondary | ICD-10-CM | POA: Diagnosis not present

## 2021-03-26 NOTE — Assessment & Plan Note (Signed)
Discussed supportive measures. I advised pt to do a home covid test and then let me know through mychart how it turns out. Would consider antiviral rx if +.

## 2021-03-26 NOTE — Assessment & Plan Note (Signed)
Stable on [place].  Mildly depressed TSH with normal T3/T4.  Will monitor.

## 2021-03-26 NOTE — Progress Notes (Signed)
Virtual telephone visit    Virtual Visit via Telephone Note   This visit type was conducted due to national recommendations for restrictions regarding the COVID-19 Pandemic (e.g. social distancing) in an effort to limit this patient's exposure and mitigate transmission in our community. Due to her co-morbid illnesses, this patient is at least at moderate risk for complications without adequate follow up. This format is felt to be most appropriate for this patient at this time. The patient did not have access to video technology or had technical difficulties with video requiring transitioning to audio format only (telephone). Physical exam was limited to content and character of the telephone converstion. CMA was able to get the patient set up on a telephone visit.   Patient location: Home. Patient and provider in visit Provider location: Office  I discussed the limitations of evaluation and management by telemedicine and the availability of in person appointments. The patient expressed understanding and agreed to proceed.   Visit Date: 03/26/2021  Today's healthcare provider: Nance Pear, NP     Subjective:    Patient ID: Taylor Atkins, female    DOB: Jan 24, 1956, 65 y.o.   MRN: [id number]  Chief Complaint  Patient presents with   Follow-up    4 week    Cough    Runny nose , congestion , onset: 1 day     HPI   Patient is in today for video visit. She presents with a a c/o cough, runny nose and nasal congestion which has been present for 1 day. Today was originally scheduled for an in person follow up.  Visit was changed to virtual due to her symptoms. She had audio issues with the video visit so we transitioned to phone visit.   Last visit we increased her lexapro from 5mg  to 10mg  due to patient having increased anxiety and low mood. She reports tolerating this dose without side effects.  Since the dose was increased she reports that she is not feeling "quite so  down." She is pleased with the improvement.   She is scheduled first week of August for a follow up CT scan through pulmonology to follow up on incidental finding of lung mass.   Past Medical History:  Diagnosis Date   Acne    Anxiety    Basal cell carcinoma 2021   right forearm   Goiter    Osteopenia    Pancreatic lesion 03/07/2021   Renal cyst    Skin cancer 02/2014   Squamous cell carcinoma in situ 2021   left shin   Thyroid disease    Tobacco use disorder     Past Surgical History:  Procedure Laterality Date   [place], 1993   EXCISION MASS UPPER EXTREMETIES     [facility]   HEMORRHOID SURGERY  1994   TUBAL LIGATION  1993    Family History  Problem Relation Age of Onset   Hypertension Mother        Afib, living   Dementia Mother    Heart disease Father        Afib, pacemaker, PVD   Hypertension Father    Diabetes Brother    Hypertension Brother    Diabetes Brother    Hypertension Brother    Colon cancer Neg Hx     Social History   Socioeconomic History   Marital status: Married    Spouse name: Not on file   Number of children: Not  on file   Years of education: Not on file   Highest education level: Not on file  Occupational History   Not on file  Tobacco Use   Smoking status: Every Day    Packs/day: 0.50    Years: 40.00    Pack years: 20.00    Types: Cigarettes    Last attempt to quit: 01/13/2016    Years since quitting: 5.2   Smokeless tobacco: Never  Substance and Sexual Activity   Alcohol use: Yes    Alcohol/week: 0.0 standard drinks    Comment: seldom   Drug use: No   Sexual activity: Yes  Other Topics Concern   Not on file  Social History Narrative   Married   2 sons (one in college) one about to move out   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, boards horses, raises cows   Retired [employer]   Enjoys resting    Social Determinants of Radio broadcast assistant Strain: Not on file  Food Insecurity: Not on file  Transportation  Needs: Not on file  Physical Activity: Not on file  Stress: Not on file  Social Connections: Not on file  Intimate Partner Violence: Not on file    Outpatient Medications Prior to Visit  Medication Sig Dispense Refill   albuterol (VENTOLIN HFA) 108 (90 Base) MCG/ACT inhaler Inhale 2 puffs into the lungs every 6 (six) hours as needed for wheezing or shortness of breath. 6.7 g 1   Calcium Carbonate-Vitamin D 600-400 MG-UNIT per chew tablet Chew 1 tablet by mouth 2 (two) times daily.     escitalopram (LEXAPRO) 10 MG tablet Take 1 tablet (10 mg total) by mouth daily. 90 tablet 1   ibandronate (BONIVA) 150 MG tablet Take 1 tablet (150 mg total) by mouth every 30 (thirty) days. Take in the morning with a full glass of water, on an empty stomach, and do not take anything else by mouth or lie down for the next 30 min. 3 tablet 4   ipratropium-albuterol (DUONEB) 0.5-2.5 (3) MG/3ML SOLN Take 3 mLs by nebulization every 6 (six) hours as needed. 360 mL 1   meclizine (ANTIVERT) 25 MG tablet Take one tablet every 8 hours as needed for dizziness 30 tablet 0   meloxicam (MOBIC) 7.5 MG tablet TAKE 1 TABLET BY MOUTH  DAILY AS NEEDED FOR PAIN 90 tablet 1   Tiotropium Bromide-Olodaterol (STIOLTO RESPIMAT) 2.5-2.5 MCG/ACT AERS Inhale into the lungs.     traZODone (DESYREL) 50 MG tablet Take 0.5-1 tablets (25-50 mg total) by mouth at bedtime as needed for sleep. 90 tablet 1   COVID-19 mRNA Vac-TriS, Pfizer, SUSP injection Inject into the muscle. 0.3 mL 0   COVID-19 mRNA vaccine, Pfizer, 30 MCG/0.3ML injection AS DIRECTED .3 mL 0   No facility-administered medications prior to visit.    Allergies  Allergen Reactions   Fosamax [Alendronate Sodium] Other (See Comments)    Extreme nausea    ROS    See HPI Objective:    Physical Exam  There were no vitals taken for this visit. Wt Readings from Last 3 Encounters:  02/26/21 118 lb 12.8 oz (53.9 kg)  09/11/20 114 lb (51.7 kg)  08/21/20 114 lb (51.7 kg)     Gen: Awake, alert, no acute distress Resp: Breathing is even and non-labored Psych: calm/pleasant demeanor Neuro: Alert and Oriented x 3, + facial symmetry, speech is clear.      Assessment & Plan:   Problem List Items Addressed This Visit  Unprioritized   Upper respiratory infection - Primary    Discussed supportive measures. I advised pt to do a home covid test and then let me know through mychart how it turns out. Would consider antiviral rx if +.         Multinodular goiter  see ultrasound  2014    Stable on [place].  Mildly depressed TSH with normal T3/T4.  Will monitor.       Lung mass    She will have repeat lung CT in August that is being arranged by pulmonology at [facility].         I have discontinued Ileana Ladd. Budzinski's COVID-19 mRNA vaccine Therapist, music) and COVID-19 mRNA Vac-TriS [employer]). I am also having her maintain her meclizine, Calcium Carbonate-Vitamin D, ibandronate, meloxicam, albuterol, ipratropium-albuterol, Stiolto Respimat, traZODone, and escitalopram.  No orders of the defined types were placed in this encounter.    I discussed the assessment and treatment plan with the patient. The patient was provided an opportunity to ask questions and all were answered. The patient agreed with the plan and demonstrated an understanding of the instructions.   The patient was advised to call back or seek an in-person evaluation if the symptoms worsen or if the condition fails to improve as anticipated.  I provided 11 minutes of non-face-to-face time during this encounter.   Nance Pear, NP [employer] [facility] [employer] [facility] [phone] (phone) [phone] (fax)  [place]

## 2021-03-26 NOTE — Assessment & Plan Note (Signed)
She will have repeat lung CT in August that is being arranged by pulmonology at [facility].

## 2021-04-03 DIAGNOSIS — C44519 Basal cell carcinoma of skin of other part of trunk: Secondary | ICD-10-CM | POA: Diagnosis not present

## 2021-04-03 DIAGNOSIS — L920 Granuloma annulare: Secondary | ICD-10-CM | POA: Diagnosis not present

## 2021-04-16 DIAGNOSIS — J438 Other emphysema: Secondary | ICD-10-CM | POA: Diagnosis not present

## 2021-04-16 DIAGNOSIS — I700 Atherosclerosis of aorta: Secondary | ICD-10-CM | POA: Diagnosis not present

## 2021-04-16 DIAGNOSIS — R911 Solitary pulmonary nodule: Secondary | ICD-10-CM | POA: Diagnosis not present

## 2021-04-16 DIAGNOSIS — R918 Other nonspecific abnormal finding of lung field: Secondary | ICD-10-CM | POA: Diagnosis not present

## 2021-04-16 DIAGNOSIS — J439 Emphysema, unspecified: Secondary | ICD-10-CM | POA: Diagnosis not present

## 2021-04-16 DIAGNOSIS — N200 Calculus of kidney: Secondary | ICD-10-CM | POA: Diagnosis not present

## 2021-04-25 ENCOUNTER — Telehealth: Payer: Self-pay | Admitting: Family

## 2021-04-25 NOTE — Telephone Encounter (Signed)
Left message for patient to call back and schedule Medicare Annual Wellness Visit (AWV) in office.   If not able to come in office, please offer to do virtually or by telephone.  Left office number and my jabber #[phone].  Due for AWVI  Please schedule at anytime with Nurse Health Advisor.   

## 2021-04-30 ENCOUNTER — Other Ambulatory Visit (HOSPITAL_BASED_OUTPATIENT_CLINIC_OR_DEPARTMENT_OTHER): Payer: Self-pay

## 2021-04-30 MED ORDER — ALBUTEROL SULFATE HFA 108 (90 BASE) MCG/ACT IN AERS
INHALATION_SPRAY | RESPIRATORY_TRACT | 5 refills | Status: DC
  Filled 2021-04-30: qty 18, 25d supply, fill #0
  Filled 2021-06-21: qty 18, 25d supply, fill #1
  Filled 2021-07-27: qty 18, 25d supply, fill #2
  Filled 2021-08-19: qty 18, 25d supply, fill #3

## 2021-05-01 ENCOUNTER — Ambulatory Visit (INDEPENDENT_AMBULATORY_CARE_PROVIDER_SITE_OTHER): Payer: Medicare HMO

## 2021-05-01 ENCOUNTER — Other Ambulatory Visit: Payer: Self-pay

## 2021-05-01 VITALS — BP 118/64 | HR 85 | Temp 98.6°F | Resp 16 | Ht 63.0 in | Wt 116.0 lb

## 2021-05-01 DIAGNOSIS — Z0000 Encounter for general adult medical examination without abnormal findings: Secondary | ICD-10-CM | POA: Diagnosis not present

## 2021-05-01 NOTE — Patient Instructions (Signed)
Taylor Atkins , Thank you for taking time to come for your Medicare Wellness Visit. I appreciate your ongoing commitment to your health goals. Please review the following plan we discussed and let me know if I can assist you in the future.   Screening recommendations/referrals: Colonoscopy: Scheduled for 05/2021 Mammogram: Completed 11/28/2020-Due 11/28/2021 Bone Density: Completed 02/15/2020-Due 02/14/2022 Recommended yearly ophthalmology/optometry visit for glaucoma screening and checkup Recommended yearly dental visit for hygiene and checkup  Vaccinations: Influenza vaccine: Due-May obtain vaccine at our office or your local pharmacy. Pneumococcal vaccine: Up to date Tdap vaccine: Up to date-Due-04/2024 Shingles vaccine: Completed vaccines   Covid-19:Up to date  Advanced directives: Please bring a copy for your chart  Conditions/risks identified: See problem list  Next appointment: Follow up in one year for your annual wellness visit 05/07/2022 @ 9:00   Preventive Care 65 Years and Older, Female Preventive care refers to lifestyle choices and visits with your health care provider that can promote health and wellness. What does preventive care include? A yearly physical exam. This is also called an annual well check. Dental exams once or twice a year. Routine eye exams. Ask your health care provider how often you should have your eyes checked. Personal lifestyle choices, including: Daily care of your teeth and gums. Regular physical activity. Eating a healthy diet. Avoiding tobacco and drug use. Limiting alcohol use. Practicing safe sex. Taking low-dose aspirin every day. Taking vitamin and mineral supplements as recommended by your health care provider. What happens during an annual well check? The services and screenings done by your health care provider during your annual well check will depend on your age, overall health, lifestyle risk factors, and family history of  disease. Counseling  Your health care provider may ask you questions about your: Alcohol use. Tobacco use. Drug use. Emotional well-being. Home and relationship well-being. Sexual activity. Eating habits. History of falls. Memory and ability to understand (cognition). Work and work Statistician. Reproductive health. Screening  You may have the following tests or measurements: Height, weight, and BMI. Blood pressure. Lipid and cholesterol levels. These may be checked every 5 years, or more frequently if you are over 68 years old. Skin check. Lung cancer screening. You may have this screening every year starting at age 11 if you have a 30-pack-year history of smoking and currently smoke or have quit within the past 15 years. Fecal occult blood test (FOBT) of the stool. You may have this test every year starting at age 28. Flexible sigmoidoscopy or colonoscopy. You may have a sigmoidoscopy every 5 years or a colonoscopy every 10 years starting at age 57. Hepatitis C blood test. Hepatitis B blood test. Sexually transmitted disease (STD) testing. Diabetes screening. This is done by checking your blood sugar (glucose) after you have not eaten for a while (fasting). You may have this done every 1-3 years. Bone density scan. This is done to screen for osteoporosis. You may have this done starting at age 55. Mammogram. This may be done every 1-2 years. Talk to your health care provider about how often you should have regular mammograms. Talk with your health care provider about your test results, treatment options, and if necessary, the need for more tests. Vaccines  Your health care provider may recommend certain vaccines, such as: Influenza vaccine. This is recommended every year. Tetanus, diphtheria, and acellular pertussis (Tdap, Td) vaccine. You may need a Td booster every 10 years. Zoster vaccine. You may need this after age 84. Pneumococcal 13-valent conjugate (  PCV13) vaccine. One  dose is recommended after age 4. Pneumococcal polysaccharide (PPSV23) vaccine. One dose is recommended after age 90. Talk to your health care provider about which screenings and vaccines you need and how often you need them. This information is not intended to replace advice given to you by your health care provider. Make sure you discuss any questions you have with your health care provider. Document Released: 09/20/2015 Document Revised: 05/13/2016 Document Reviewed: 06/25/2015 Elsevier Interactive Patient Education  2017 [place] Prevention in the Home Falls can cause injuries. They can happen to people of all ages. There are many things you can do to make your home safe and to help prevent falls. What can I do on the outside of my home? Regularly fix the edges of walkways and driveways and fix any cracks. Remove anything that might make you trip as you walk through a door, such as a raised step or threshold. Trim any bushes or trees on the path to your home. Use bright outdoor lighting. Clear any walking paths of anything that might make someone trip, such as rocks or tools. Regularly check to see if handrails are loose or broken. Make sure that both sides of any steps have handrails. Any raised decks and porches should have guardrails on the edges. Have any leaves, snow, or ice cleared regularly. Use sand or salt on walking paths during winter. Clean up any spills in your garage right away. This includes oil or grease spills. What can I do in the bathroom? Use night lights. Install grab bars by the toilet and in the tub and shower. Do not use towel bars as grab bars. Use non-skid mats or decals in the tub or shower. If you need to sit down in the shower, use a plastic, non-slip stool. Keep the floor dry. Clean up any water that spills on the floor as soon as it happens. Remove soap buildup in the tub or shower regularly. Attach bath mats securely with double-sided  non-slip rug tape. Do not have throw rugs and other things on the floor that can make you trip. What can I do in the bedroom? Use night lights. Make sure that you have a light by your bed that is easy to reach. Do not use any sheets or blankets that are too big for your bed. They should not hang down onto the floor. Have a firm chair that has side arms. You can use this for support while you get dressed. Do not have throw rugs and other things on the floor that can make you trip. What can I do in the kitchen? Clean up any spills right away. Avoid walking on wet floors. Keep items that you use a lot in easy-to-reach places. If you need to reach something above you, use a strong step stool that has a grab bar. Keep electrical cords out of the way. Do not use floor polish or wax that makes floors slippery. If you must use wax, use non-skid floor wax. Do not have throw rugs and other things on the floor that can make you trip. What can I do with my stairs? Do not leave any items on the stairs. Make sure that there are handrails on both sides of the stairs and use them. Fix handrails that are broken or loose. Make sure that handrails are as long as the stairways. Check any carpeting to make sure that it is firmly attached to the stairs. Fix any carpet that is  loose or worn. Avoid having throw rugs at the top or bottom of the stairs. If you do have throw rugs, attach them to the floor with carpet tape. Make sure that you have a light switch at the top of the stairs and the bottom of the stairs. If you do not have them, ask someone to add them for you. What else can I do to help prevent falls? Wear shoes that: Do not have high heels. Have rubber bottoms. Are comfortable and fit you well. Are closed at the toe. Do not wear sandals. If you use a stepladder: Make sure that it is fully opened. Do not climb a closed stepladder. Make sure that both sides of the stepladder are locked into place. Ask  someone to hold it for you, if possible. Clearly mark and make sure that you can see: Any grab bars or handrails. First and last steps. Where the edge of each step is. Use tools that help you move around (mobility aids) if they are needed. These include: Canes. Walkers. Scooters. Crutches. Turn on the lights when you go into a dark area. Replace any light bulbs as soon as they burn out. Set up your furniture so you have a clear path. Avoid moving your furniture around. If any of your floors are uneven, fix them. If there are any pets around you, be aware of where they are. Review your medicines with your doctor. Some medicines can make you feel dizzy. This can increase your chance of falling. Ask your doctor what other things that you can do to help prevent falls. This information is not intended to replace advice given to you by your health care provider. Make sure you discuss any questions you have with your health care provider. Document Released: 06/20/2009 Document Revised: 01/30/2016 Document Reviewed: 09/28/2014 Elsevier Interactive Patient Education  2017 [employer].

## 2021-05-01 NOTE — Progress Notes (Signed)
Subjective:   CANYA UMBEL is a 65 y.o. female who presents for an Initial Medicare Annual Wellness Visit.  Review of Systems     Cardiac Risk Factors include: advanced age (>54mn, >>79women);sedentary lifestyle;smoking/ tobacco exposure     Objective:    Today's Vitals   05/01/21 0853  BP: 118/64  Pulse: 85  Resp: 16  Temp: 98.6 F (37 C)  TempSrc: Temporal  SpO2: 93%  Weight: 116 lb (52.6 kg)  Height: '5\' 3"'$  (1.6 m)   Body mass index is 20.55 kg/m.  Advanced Directives 05/01/2021 09/27/2015 08/07/2015  Does Patient Have a Medical Advance Directive? Yes Yes Yes  Type of AParamedicof ARyeLiving will - [place]ardLiving will  Does patient want to make changes to medical advance directive? - - No - Patient declined  Copy of HRiver [place] Chart? No - copy requested - No - copy requested    Current Medications (verified) Outpatient Encounter Medications as of 05/01/2021  Medication Sig   albuterol (VENTOLIN HFA) 108 (90 Base) MCG/ACT inhaler Inhale 2 puffs into the lungs every 6 (six) hours as needed for wheezing or shortness of breath.   albuterol (VENTOLIN HFA) 108 (90 Base) MCG/ACT inhaler Inhale 2 puffs into the lungs every 6 (six) hours as needed for up to 30 days for Wheezing or Shortness of Breath.   Calcium Carbonate-Vitamin D 600-400 MG-UNIT per chew tablet Chew 1 tablet by mouth 2 (two) times daily.   escitalopram (LEXAPRO) 10 MG tablet Take 1 tablet (10 mg total) by mouth daily.   ibandronate (BONIVA) 150 MG tablet Take 1 tablet (150 mg total) by mouth every 30 (thirty) days. Take in the morning with a full glass of water, on an empty stomach, and do not take anything else by mouth or lie down for the next 30 min.   ipratropium-albuterol (DUONEB) 0.5-2.5 (3) MG/3ML SOLN Take 3 mLs by nebulization every 6 (six) hours as needed.   meclizine (ANTIVERT) 25 MG tablet Take one tablet every 8 hours as  needed for dizziness   meloxicam (MOBIC) 7.5 MG tablet TAKE 1 TABLET BY MOUTH  DAILY AS NEEDED FOR PAIN   Tiotropium Bromide-Olodaterol (STIOLTO RESPIMAT) 2.5-2.5 MCG/ACT AERS Inhale into the lungs.   traZODone (DESYREL) 50 MG tablet Take 0.5-1 tablets (25-50 mg total) by mouth at bedtime as needed for sleep.   [DISCONTINUED] buPROPion (WELLBUTRIN XL) 300 MG 24 hr tablet Take 1 tablet (300 mg total) by mouth daily. (Patient not taking: Reported on 06/16/2019)   No facility-administered encounter medications on file as of 05/01/2021.    Allergies (verified) Fosamax [alendronate sodium]   History: Past Medical History:  Diagnosis Date   Acne    Anxiety    Basal cell carcinoma 2021   right forearm   Goiter    Osteopenia    Pancreatic lesion 03/07/2021   Renal cyst    Skin cancer 02/2014   Squamous cell carcinoma in situ 2021   left shin   Thyroid disease    Tobacco use disorder    Past Surgical History:  [place]cedure [place] Date   [place]   EXCISION MASS UPPER EXTREMETIES     AVM   HEMORRHOID SURGERY  1994   TUBAL LIGATION  1993   Family History  Problem Relation Age of Onset   Hypertension Mother        Afib, living  Dementia Mother    Heart disease Father        Afib, pacemaker, PVD   Hypertension Father    Diabetes Brother    Hypertension Brother    Diabetes Brother    Hypertension Brother    Colon cancer Neg Hx    Social History   Socioeconomic History   Marital status: Married    Spouse name: Not on file   Number of children: Not on file   Years of education: Not on file   Highest education level: Not on file  Occupational History   Not on file  Tobacco Use   Smoking status: Every Day    Packs/day: 0.50    Years: 40.00    Pack years: 20.00    Types: Cigarettes    Last attempt to quit: 01/13/2016    Years since quitting: 5.3   Smokeless tobacco: Never  Substance and Sexual Activity   Alcohol use: Yes     Alcohol/week: 0.0 standard drinks    Comment: seldom   Drug use: No   Sexual activity: Yes  Other Topics Concern   Not on file  Social History Narrative   Married   2 sons (one in college) one about to move out   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, boards horses, raises cows   Retired [employer]   Enjoys resting    Social Determinants of Radio broadcast assistant Strain: Low Risk    Difficulty of Paying Living Expenses: Not hard at all  Food Insecurity: No Food Insecurity   Worried About Charity fundraiser in the Last Year: Never true   Arboriculturist in the Last Year: Never true  Transportation Needs: No Transportation Needs   Lack of Transportation (Medical): No   Lack of Transportation (Non-Medical): No  Physical Activity: Inactive   Days of Exercise per Week: 0 days   Minutes of Exercise per Session: 0 min  Stress: Stress Concern Present   Feeling of Stress : To some extent  Social Connections: Moderately Isolated   Frequency of Communication with Friends and Family: More than three times a week   Frequency of Social Gatherings with Friends and Family: More than three times a week   Attends Religious Services: Never   Marine scientist or Organizations: No   Attends Music therapist: Never   Marital Status: Married    Tobacco Counseling Ready to quit: Not Answered Counseling given: Not Answered   Clinical Intake:  Pre-visit preparation completed: Yes  Pain : No/denies pain     Nutritional Status: BMI of 19-24  Normal Nutritional Risks: None Diabetes: No  How often do you need to have someone help you when you read instructions, pamphlets, or other written materials from your doctor or pharmacy?: 1 - Never  Diabetic?No  Interpreter Needed?: No  Information entered by :: Caroleen Hamman LPN   Activities of Daily Living In your present state of health, do you have any difficulty performing the following activities: 05/01/2021 02/26/2021  Hearing? N N   Vision? N N  Difficulty concentrating or making decisions? Y N  Comment occasionally forgets a name -  Walking or climbing stairs? N N  Dressing or bathing? N N  Doing errands, shopping? N N  Preparing Food and eating ? N -  Using the Toilet? N -  In the past six months, have you accidently leaked urine? N -  Do you have problems with loss of bowel control? N -  Managing your Medications? N -  Managing your Finances? N -  Housekeeping or managing your Housekeeping? N -  Some recent data might be hidden    Patient Care Team: Debbrah Alar, NP as PCP - General (Internal Medicine) Clent Jacks, MD as Consulting Physician (Ophthalmology) [place], Jennefer Bravo, MD as Referring Physician (Dermatology)  Indicate any recent Medical Services you may have received from other than [facility] providers in the past year (date may be approximate).     Assessment:   This is a routine wellness examination for [place].  Hearing/Vision screen Hearing Screening - Comments:: No issues Vision Screening - Comments:: Eye exam scheduled for 06/2021-[facility]  Dietary issues and exercise activities discussed: Current Exercise Habits: The patient does not participate in regular exercise at present, Exercise limited by: respiratory conditions(s) (breathing issues)   Goals Addressed             This Visit's Progress    Patient Stated       Maintain current health       Depression Screen PHQ 2/9 Scores 05/01/2021 02/26/2021 02/07/2020 12/07/2018 03/03/2018 02/16/2017 05/15/2016  PHQ - 2 Score 2 5 0 6 2 0 0  PHQ- 9 Score 5 13 0 7 7 - -    Fall Risk Fall Risk  05/01/2021 02/26/2021 02/07/2020 02/16/2017 04/10/2014  Falls in the past year? 0 0 0 No No  Number falls in past yr: 0 0 0 - -  Injury with Fall? 0 0 0 - -  Follow up Falls prevention discussed - - - -    FALL RISK PREVENTION PERTAINING TO THE HOME:  Any stairs in or around the home? Yes  If so, are there any without handrails? No  Home  free of loose throw rugs in walkways, pet beds, electrical cords, etc? Yes  Adequate lighting in your home to reduce risk of falls? Yes   ASSISTIVE DEVICES UTILIZED TO PREVENT FALLS:  Life alert? No  Use of a cane, walker or w/c? No  Grab bars in the bathroom? No  Shower chair or bench in shower? No  Elevated toilet seat or a handicapped toilet? No   TIMED UP AND GO:  Was the test performed? Yes .  Length of time to ambulate 10 feet: 11 sec.   Gait steady and fast without use of assistive device  Cognitive Function:     6CIT Screen 05/01/2021  What Year? 0 points  What month? 0 points  What time? 0 points  Count back from 20 0 points  Months in reverse 0 points  Repeat phrase 0 points  Total Score 0    Immunizations Immunization History  Administered Date(s) Administered   Fluad Quad(high Dose 65+) 06/16/2019, 08/21/2020   Influenza-Unspecified 06/07/2014, 05/23/2015   PFIZER Comirnaty(Gray Top)Covid-19 Tri-Sucrose Vaccine 02/26/2021   PFIZER(Purple Top)SARS-COV-2 Vaccination 09/29/2019, 10/19/2019, 06/07/2020   Pneumococcal Conjugate-13 07/27/2014   Pneumococcal Polysaccharide-23 08/21/2020   Tdap 04/07/2014   Zoster Recombinat (Shingrix) 06/08/2018, 08/09/2018   Zoster, Live 08/22/2015    TDAP status: Up to date  Flu Vaccine status: Due, Education has been provided regarding the importance of this vaccine. Advised may receive this vaccine at local pharmacy or Health Dept. Aware to provide a copy of the vaccination record if obtained from local pharmacy or Health Dept. Verbalized acceptance and understanding.  Pneumococcal vaccine status: Up to date  Covid-19 vaccine status: Completed vaccines  Qualifies for Shingles Vaccine? No   Zostavax completed Yes   Shingrix Completed?: Yes  Screening Tests Health Maintenance  Topic Date Due   COLONOSCOPY (Pts 45-72yr Insurance coverage will need to be confirmed)  09/26/2020   COVID-19 Vaccine (5 - Booster for  Pfizer series) 06/28/2021   MAMMOGRAM  11/28/2021   TETANUS/TDAP  04/10/2024   DEXA SCAN  Completed   Hepatitis C Screening  Completed   PNA vac Low Risk Adult  Completed   Zoster Vaccines- Shingrix  Completed   HPV VACCINES  Aged Out   INFLUENZA VACCINE  Discontinued    Health Maintenance  Health Maintenance Due  Topic Date Due   COLONOSCOPY (Pts 45-479yrInsurance coverage will need to be confirmed)  09/26/2020    Colorectal cancer screening: Colonoscopy scheduled for 05/2021  Mammogram status: Completed Bilateral 11/28/2020. Repeat every year  Bone Density status: Completed 02/15/2020. Results reflect: Bone density results: OSTEOPOROSIS. Repeat every 2 years.  Lung Cancer Screening: (Low Dose CT Chest recommended if Age 67-80ears, 30 pack-year currently smoking OR have quit w/in 15years.) does not qualify.     Additional Screening:  Hepatitis C Screening: Completed 02/16/2017  Vision Screening: Recommended annual ophthalmology exams for early detection of glaucoma and other disorders of the eye. Is the patient up to date with their annual eye exam?  Yes  Who is the provider or what is the name of the office in which the patient attends annual eye exams? [facility]   Dental Screening: Recommended annual dental exams for proper oral hygiene  Community Resource Referral / Chronic Care Management: CRR required this visit?  No   CCM required this visit?  No      Plan:     I have personally reviewed and noted the following in the patient's chart:   Medical and social history Use of alcohol, tobacco or illicit drugs  Current medications and supplements including opioid prescriptions. Patient is not currently taking opioid prescriptions. Functional ability and status Nutritional status Physical activity Advanced directives List of other physicians Hospitalizations, surgeries, and ER visits in previous 12 months Vitals Screenings to include cognitive,  depression, and falls Referrals and appointments  In addition, I have reviewed and discussed with patient certain preventive protocols, quality metrics, and best practice recommendations. A written personalized care plan for preventive services as well as general preventive health recommendations were provided to patient.   Patient to access avs on mychart  MaMarta Ant[place]LPW[place] [id number] Health Advisor  Nurse Notes: None

## 2021-05-13 ENCOUNTER — Other Ambulatory Visit (HOSPITAL_BASED_OUTPATIENT_CLINIC_OR_DEPARTMENT_OTHER): Payer: Self-pay

## 2021-05-20 ENCOUNTER — Other Ambulatory Visit: Payer: Self-pay | Admitting: Family

## 2021-05-21 ENCOUNTER — Ambulatory Visit (AMBULATORY_SURGERY_CENTER): Payer: Medicare HMO | Admitting: *Deleted

## 2021-05-21 ENCOUNTER — Other Ambulatory Visit: Payer: Self-pay

## 2021-05-21 VITALS — Ht 63.0 in | Wt 118.0 lb

## 2021-05-21 DIAGNOSIS — Z86010 Personal history of colonic polyps: Secondary | ICD-10-CM

## 2021-05-21 MED ORDER — PLENVU 140 G PO SOLR: 1.0000 | Freq: Once | ORAL | 0 refills | Status: AC

## 2021-05-21 NOTE — Progress Notes (Signed)
Patient's pre-visit was done today over the phone with the patient due to COVID-19 pandemic. Name,DOB and address verified. Patient denies any allergies to Eggs and Soy. Patient denies any problems with anesthesia/sedation. Patient is not taking any diet pills or blood thinners. No home Oxygen. Packet of Prep instructions mailed to patient including a copy of a consent form & Coupon-pt is aware. Patient understands to call us back with any questions or concerns. Patient is aware of our care-partner policy and Covid-19 safety protocol.   EMMI education assigned to the patient for the procedure, sent to MyChart.   The patient is COVID-19 vaccinated.   

## 2021-05-22 ENCOUNTER — Encounter: Payer: Self-pay | Admitting: Gastroenterology

## 2021-05-28 ENCOUNTER — Telehealth: Payer: Self-pay | Admitting: Gastroenterology

## 2021-05-28 NOTE — Telephone Encounter (Signed)
Patient will activate a new medicare plenvu coupon using the website and she will take that to the pharmacy.

## 2021-05-28 NOTE — Telephone Encounter (Signed)
Inbound call from patient. States the coupon for her medication is not working. She states it has already been used and need a new one.

## 2021-06-03 ENCOUNTER — Telehealth: Payer: Self-pay | Admitting: Gastroenterology

## 2021-06-03 NOTE — Telephone Encounter (Signed)
Patient called to let the office know that she had two dental extractions yesterday.

## 2021-06-04 ENCOUNTER — Other Ambulatory Visit: Payer: Self-pay

## 2021-06-04 ENCOUNTER — Ambulatory Visit (AMBULATORY_SURGERY_CENTER): Payer: Medicare HMO | Admitting: Gastroenterology

## 2021-06-04 ENCOUNTER — Encounter: Payer: Self-pay | Admitting: Gastroenterology

## 2021-06-04 VITALS — BP 116/68 | HR 84 | Temp 98.0°F | Resp 27 | Ht 63.0 in | Wt 118.0 lb

## 2021-06-04 DIAGNOSIS — D123 Benign neoplasm of transverse colon: Secondary | ICD-10-CM | POA: Diagnosis not present

## 2021-06-04 DIAGNOSIS — K635 Polyp of colon: Secondary | ICD-10-CM | POA: Diagnosis not present

## 2021-06-04 DIAGNOSIS — K6389 Other specified diseases of intestine: Secondary | ICD-10-CM | POA: Diagnosis not present

## 2021-06-04 DIAGNOSIS — Z86010 Personal history of colonic polyps: Secondary | ICD-10-CM

## 2021-06-04 DIAGNOSIS — J449 Chronic obstructive pulmonary disease, unspecified: Secondary | ICD-10-CM | POA: Diagnosis not present

## 2021-06-04 MED ORDER — SODIUM CHLORIDE 0.9 % IV SOLN: 500.0000 mL | INTRAVENOUS | Status: DC

## 2021-06-04 NOTE — Progress Notes (Signed)
Called to room to assist during endoscopic procedure.  Patient ID and intended procedure confirmed with present staff. Received instructions for my participation in the procedure from the performing physician.  

## 2021-06-04 NOTE — Op Note (Signed)
[facility] Patient Name: Taylor Atkins Procedure Date: 06/04/2021 10:08 AM MRN: [id number] Endoscopist: Mauri Pole , MD Age: 65 Referring MD:  Date of Birth: Apr 24, 1956 Gender: Female Account #: [id number] Procedure:                Colonoscopy Indications:              High risk colon cancer surveillance: Personal                            history of colonic polyps, High risk colon cancer                            surveillance: Personal history of sessile serrated                            colon polyp (less than 10 mm in size) with no                            dysplasia Medicines:                Monitored Anesthesia Care Procedure:                Pre-Anesthesia Assessment:                           - Prior to the procedure, a History and Physical                            was performed, and patient medications and                            allergies were reviewed. The patient's tolerance of                            previous anesthesia was also reviewed. The risks                            and benefits of the procedure and the sedation                            options and risks were discussed with the patient.                            All questions were answered, and informed consent                            was obtained. Prior Anticoagulants: The patient has                            taken no previous anticoagulant or antiplatelet                            agents. ASA Grade Assessment: III - A patient with  severe systemic disease. After reviewing the risks                            and benefits, the patient was deemed in                            satisfactory condition to undergo the procedure.                           After obtaining informed consent, the colonoscope                            was passed under direct vision. Throughout the                            procedure, the patient's blood pressure, pulse,  and                            oxygen saturations were monitored continuously. The                            Olympus PCF-H190DL ([id number]) Colonoscope was                            introduced through the anus and advanced to the the                            cecum, identified by appendiceal orifice and                            ileocecal valve. The colonoscopy was performed                            without difficulty. The patient tolerated the                            procedure well. The quality of the bowel                            preparation was good. The ileocecal valve,                            appendiceal orifice, and rectum were photographed. Scope In: 10:23:09 AM Scope Out: 10:38:10 AM Scope Withdrawal Time: 0 hours 7 minutes 40 seconds  Total Procedure Duration: 0 hours 15 minutes 1 second  Findings:                 The perianal and digital rectal examinations were                            normal.                           A less than 1 mm polyp was found in the transverse  colon. The polyp was sessile. The polyp was removed                            with a cold biopsy forceps. Resection and retrieval                            were complete.                           Scattered small and large-mouthed diverticula were                            found in the sigmoid colon, transverse colon and                            ascending colon.                           Non-bleeding external and internal hemorrhoids were                            found during retroflexion. The hemorrhoids were                            medium-sized. Complications:            No immediate complications. Estimated Blood Loss:     Estimated blood loss was minimal. Impression:               - One less than 1 mm polyp in the transverse colon,                            removed with a cold biopsy forceps. Resected and                            retrieved.                            - Diverticulosis in the sigmoid colon, in the                            transverse colon and in the ascending colon.                           - Non-bleeding external and internal hemorrhoids. Recommendation:           - Patient has a contact number available for                            emergencies. The signs and symptoms of potential                            delayed complications were discussed with the                            patient. Return to normal activities tomorrow.  Written discharge instructions were provided to the                            patient.                           - Resume previous diet.                           - Continue present medications.                           - Await pathology results.                           - Repeat colonoscopy in 5-10 years for surveillance                            based on pathology results. Mauri Pole, MD 06/04/2021 10:47:57 AM This report has been signed electronically.

## 2021-06-04 NOTE — Progress Notes (Signed)
[facility] History and Physical   Primary Care Physician:  Debbrah Alar, NP   Reason for Procedure:  History of adenomatous colon polyps  Plan:    Surveillance colonoscopy with possible interventions as needed     HPI: Taylor Atkins is a very pleasant 65 y.o. female here for colonoscopy. Denies any nausea, vomiting, abdominal pain, melena or bright red blood per rectum  The risks and benefits as well as alternatives of endoscopic procedure(s) have been discussed and reviewed. All questions answered. The patient agrees to proceed.    Past Medical History:  Diagnosis Date   Acne    Anxiety    Basal cell carcinoma 2021   right forearm   COPD (chronic obstructive pulmonary disease) ([place])    Goiter    Osteopenia    Pancreatic lesion 03/07/2021   Renal cyst    Skin cancer 02/2014   Squamous cell carcinoma in situ 2021   left shin   Thyroid disease    Tobacco use disorder     Past Surgical History:  Procedure Laterality Date   [place]   COLONOSCOPY  09/27/2015   Dr.Whitlee Sluder   EXCISION MASS UPPER EXTREMETIES     [facility]   HEMORRHOID SURGERY  1994   TUBAL LIGATION  1993    Prior to Admission medications   Medication Sig Start Date End Date Taking? Authorizing Provider  albuterol (VENTOLIN HFA) 108 (90 Base) MCG/ACT inhaler Inhale 2 puffs into the lungs every 6 (six) hours as needed for up to 30 days for Wheezing or Shortness of Breath. 04/30/21     Calcium Carbonate-Vitamin D 600-400 MG-UNIT per chew tablet Chew 1 tablet by mouth 2 (two) times daily. 05/28/15   Debbrah Alar, NP  escitalopram (LEXAPRO) 10 MG tablet Take 1 tablet (10 mg total) by mouth daily. 02/26/21   Debbrah Alar, NP  ibandronate (BONIVA) 150 MG tablet Take 1 tablet (150 mg total) by mouth every 30 (thirty) days. Take in the morning with a full glass of water, on an empty stomach, and do not take anything else by mouth or lie  down for the next 30 min. 03/03/18   Debbrah Alar, NP  ipratropium-albuterol (DUONEB) 0.5-2.5 (3) MG/3ML SOLN Take 3 mLs by nebulization every 6 (six) hours as needed. 11/20/19   Debbrah Alar, NP  meclizine (ANTIVERT) 25 MG tablet Take one tablet every 8 hours as needed for dizziness 09/20/13   Schoenhoff, Altamese Cabal, MD  meloxicam (MOBIC) 7.5 MG tablet TAKE 1 TABLET BY MOUTH  DAILY AS NEEDED FOR PAIN 09/06/18   Debbrah Alar, NP  Tiotropium Bromide-Olodaterol (STIOLTO RESPIMAT) 2.5-2.5 MCG/ACT AERS Inhale into the lungs daily.    [provider]  traZODone (DESYREL) 50 MG tablet TAKE 1/2 TO 1 TABLET BY MOUTH AT BEDTIME AS NEEDED FOR SLEEP 05/21/21   Debbrah Alar, NP  buPROPion (WELLBUTRIN XL) 300 MG 24 hr tablet Take 1 tablet (300 mg total) by mouth daily. Patient not taking: Reported on 06/16/2019 03/27/19 10/14/19  Debbrah Alar, NP    Current Outpatient Medications  Medication Sig Dispense Refill   albuterol (VENTOLIN HFA) 108 (90 Base) MCG/ACT inhaler Inhale 2 puffs into the lungs every 6 (six) hours as needed for up to 30 days for Wheezing or Shortness of Breath. 18 g 5   Calcium Carbonate-Vitamin D 600-400 MG-UNIT per chew tablet Chew 1 tablet by mouth 2 (two) times daily.     escitalopram (LEXAPRO) 10 MG  tablet Take 1 tablet (10 mg total) by mouth daily. 90 tablet 1   ibandronate (BONIVA) 150 MG tablet Take 1 tablet (150 mg total) by mouth every 30 (thirty) days. Take in the morning with a full glass of water, on an empty stomach, and do not take anything else by mouth or lie down for the next 30 min. 3 tablet 4   ipratropium-albuterol (DUONEB) 0.5-2.5 (3) MG/3ML SOLN Take 3 mLs by nebulization every 6 (six) hours as needed. 360 mL 1   meclizine (ANTIVERT) 25 MG tablet Take one tablet every 8 hours as needed for dizziness 30 tablet 0   meloxicam (MOBIC) 7.5 MG tablet TAKE 1 TABLET BY MOUTH  DAILY AS NEEDED FOR PAIN 90 tablet 1   Tiotropium  Bromide-Olodaterol (STIOLTO RESPIMAT) 2.5-2.5 MCG/ACT AERS Inhale into the lungs daily.     traZODone (DESYREL) 50 MG tablet TAKE 1/2 TO 1 TABLET BY MOUTH AT BEDTIME AS NEEDED FOR SLEEP 90 tablet 1   No current facility-administered medications for this visit.    Allergies as of 06/04/2021 - Review Complete 05/21/2021  Allergen Reaction Noted   Fosamax [alendronate sodium] Other (See Comments) 02/12/2016    Family History  Problem Relation Age of Onset   Hypertension Mother        Afib, living   Dementia Mother    Heart disease Father        Afib, pacemaker, PVD   Hypertension Father    Diabetes Brother    Hypertension Brother    Diabetes Brother    Hypertension Brother    Colon cancer Neg Hx    Esophageal cancer Neg Hx    Rectal cancer Neg Hx    Stomach cancer Neg Hx    Colon polyps Neg Hx     Social History   Socioeconomic History   Marital status: Married    Spouse name: Not on file   Number of children: Not on file   Years of education: Not on file   Highest education level: Not on file  Occupational History   Not on file  Tobacco Use   Smoking status: Every Day    Packs/day: 0.50    Years: 40.00    Pack years: 20.00    Types: Cigarettes    Last attempt to quit: 01/13/2016    Years since quitting: 5.3   Smokeless tobacco: Never  Vaping Use   Vaping Use: Never used  Substance and Sexual Activity   Alcohol use: Not Currently   Drug use: No   Sexual activity: Yes  Other Topics Concern   Not on file  Social History Narrative   Married   2 sons (one in college) one about to move out   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, boards horses, raises cows   Retired [employer]   Enjoys resting    Social Determinants of Radio broadcast assistant Strain: Low Risk    Difficulty of Paying Living Expenses: Not hard at all  Food Insecurity: No Food Insecurity   Worried About Charity fundraiser in the Last Year: Never true   Arboriculturist in the Last Year: Never true  Transportation  Needs: No Transportation Needs   Lack of Transportation (Medical): No   Lack of Transportation (Non-Medical): No  Physical Activity: Inactive   Days of Exercise per Week: 0 days   Minutes of Exercise per Session: 0 min  Stress: Stress Concern Present   Feeling of Stress : To some extent  Social Connections: Moderately Isolated   Frequency of Communication with Friends and Family: More than three times a week   Frequency of Social Gatherings with Friends and Family: More than three times a week   Attends Religious Services: Never   Marine scientist or Organizations: No   Attends Music therapist: Never   Marital Status: Married  Human resources officer Violence: Not At Risk   Fear of Current or Ex-Partner: No   Emotionally Abused: No   Physically Abused: No   Sexually Abused: No    Review of Systems:  All other review of systems negative except as mentioned in the HPI.  Physical Exam: Vital signs in last 24 hours: BP 111/74   Pulse 92   Temp 98 F (36.7 C)   Resp 16   Ht 5\' 3"  (1.6 m)   Wt 118 lb (53.5 kg)   SpO2 94%   BMI 20.90 kg/m     General:   Alert, NAD Lungs:  Clear .   Heart:  Regular rate and rhythm Abdomen:  Soft, nontender and nondistended. Neuro/Psych:  Alert and cooperative. Normal mood and affect. A and O x 3  Reviewed labs, radiology imaging, old records and pertinent past GI work up  Patient is appropriate for planned procedure(s) and anesthesia in an ambulatory setting   K. Denzil Magnuson , MD [phone]

## 2021-06-04 NOTE — Progress Notes (Signed)
Pt's states no medical or surgical changes since previsit or office visit. 

## 2021-06-04 NOTE — Patient Instructions (Signed)
Handouts given for polyps, diverticulosis and hemorrhoids. Await biopsy results. Resume previous diet and medications.   YOU HAD AN ENDOSCOPIC PROCEDURE TODAY AT [facility]:   Refer to the procedure report that was given to you for any specific questions about what was found during the examination.  If the procedure report does not answer your questions, please call your gastroenterologist to clarify.  If you requested that your care partner not be given the details of your procedure findings, then the procedure report has been included in a sealed envelope for you to review at your convenience later.  YOU SHOULD EXPECT: Some feelings of bloating in the abdomen. Passage of more gas than usual.  Walking can help get rid of the air that was put into your GI tract during the procedure and reduce the bloating. If you had a lower endoscopy (such as a colonoscopy or flexible sigmoidoscopy) you may notice spotting of blood in your stool or on the toilet paper. If you underwent a bowel prep for your procedure, you may not have a normal bowel movement for a few days.  Please Note:  You might notice some irritation and congestion in your nose or some drainage.  This is from the oxygen used during your procedure.  There is no need for concern and it should clear up in a day or so.  SYMPTOMS TO REPORT IMMEDIATELY:  Following lower endoscopy (colonoscopy or flexible sigmoidoscopy):  Excessive amounts of blood in the stool  Significant tenderness or worsening of abdominal pains  Swelling of the abdomen that is new, acute  Fever of 100F or higher   For urgent or emergent issues, a gastroenterologist can be reached at any hour by calling [phone]. Do not use MyChart messaging for urgent concerns.    DIET:  We do recommend a small meal at first, but then you may proceed to your regular diet.  Drink plenty of fluids but you should avoid alcoholic beverages for 24 hours.  ACTIVITY:   You should plan to take it easy for the rest of today and you should NOT DRIVE or use heavy machinery until tomorrow (because of the sedation medicines used during the test).    FOLLOW UP: Our staff will call the number listed on your records 48-72 hours following your procedure to check on you and address any questions or concerns that you may have regarding the information given to you following your procedure. If we do not reach you, we will leave a message.  We will attempt to reach you two times.  During this call, we will ask if you have developed any symptoms of COVID 19. If you develop any symptoms (ie: fever, flu-like symptoms, shortness of breath, cough etc.) before then, please call [phone].  If you test positive for Covid 19 in the 2 weeks post procedure, please call and report this information to [place].    If any biopsies were taken you will be contacted by phone or by letter within the next 1-3 weeks.  Please call us at [phone] if you have not heard about the biopsies in 3 weeks.    SIGNATURES/CONFIDENTIALITY: You and/or your care partner have signed paperwork which will be entered into your electronic medical record.  These signatures attest to the fact that that the information above on your After Visit Summary has been reviewed and is understood.  Full responsibility of the confidentiality of this discharge information lies with you and/or your care-partner.

## 2021-06-04 NOTE — Progress Notes (Signed)
To PACU, VSS. Report to [email or web address] 

## 2021-06-04 NOTE — Progress Notes (Signed)
VS by [place]. 

## 2021-06-06 ENCOUNTER — Telehealth: Payer: Self-pay | Admitting: *Deleted

## 2021-06-06 NOTE — Telephone Encounter (Signed)
  Follow up Call-  Call back number 06/04/2021  Post procedure Call Back phone  # [phone]  Permission to leave phone message Yes  Some recent data might be hidden     Patient questions:  Do you have a fever, pain , or abdominal swelling? No. Pain Score  0 *  Have you tolerated food without any problems? Yes.    Have you been able to return to your normal activities? Yes.    Do you have any questions about your discharge instructions: Diet   No. Medications  No. Follow up visit  No.  Do you have questions or concerns about your Care? No.  Actions: * If pain score is 4 or above: No action needed, pain <4.  Have you developed a fever since your procedure? no  2.   Have you had an respiratory symptoms (SOB or cough) since your procedure? no  3.   Have you tested positive for COVID 19 since your procedure no  4.   Have you had any family members/close contacts diagnosed with the COVID 19 since your procedure?  no   If yes to any of these questions please route to Joylene John, RN and Joella Prince, RN

## 2021-06-10 ENCOUNTER — Encounter: Payer: Self-pay | Admitting: Gastroenterology

## 2021-06-18 DIAGNOSIS — H2513 Age-related nuclear cataract, bilateral: Secondary | ICD-10-CM | POA: Diagnosis not present

## 2021-06-18 DIAGNOSIS — D3131 Benign neoplasm of right choroid: Secondary | ICD-10-CM | POA: Diagnosis not present

## 2021-06-19 DIAGNOSIS — H5203 Hypermetropia, bilateral: Secondary | ICD-10-CM | POA: Diagnosis not present

## 2021-06-19 DIAGNOSIS — H524 Presbyopia: Secondary | ICD-10-CM | POA: Diagnosis not present

## 2021-06-23 ENCOUNTER — Other Ambulatory Visit (HOSPITAL_BASED_OUTPATIENT_CLINIC_OR_DEPARTMENT_OTHER): Payer: Self-pay

## 2021-07-01 ENCOUNTER — Other Ambulatory Visit (HOSPITAL_BASED_OUTPATIENT_CLINIC_OR_DEPARTMENT_OTHER): Payer: Self-pay

## 2021-07-04 ENCOUNTER — Ambulatory Visit: Payer: Medicare HMO | Attending: Internal Medicine

## 2021-07-04 DIAGNOSIS — Z23 Encounter for immunization: Secondary | ICD-10-CM

## 2021-07-04 NOTE — Progress Notes (Signed)
   Covid-19 Vaccination Clinic  Name:  Taylor Atkins    MRN: [id number] DOB: 10-24-1955  07/04/2021  Taylor Atkins was observed post Covid-19 immunization for 15 minutes without incident. She was provided with Vaccine Information Sheet and instruction to access the V-Safe system.   Taylor Atkins was instructed to call 911 with any severe reactions post vaccine: Difficulty breathing  Swelling of face and throat  A fast heartbeat  A bad rash all over body  Dizziness and weakness   Immunizations Administered     Name Date Dose VIS Date Route   Pfizer Covid-19 Vaccine Bivalent Booster 07/04/2021 11:58 AM 0.3 mL 05/07/2021 Intramuscular   Manufacturer: [place]   Lot: [place]   [place]: [phone]

## 2021-07-16 ENCOUNTER — Other Ambulatory Visit (HOSPITAL_BASED_OUTPATIENT_CLINIC_OR_DEPARTMENT_OTHER): Payer: Self-pay

## 2021-07-16 DIAGNOSIS — R062 Wheezing: Secondary | ICD-10-CM | POA: Diagnosis not present

## 2021-07-16 DIAGNOSIS — J432 Centrilobular emphysema: Secondary | ICD-10-CM | POA: Diagnosis not present

## 2021-07-16 DIAGNOSIS — J440 Chronic obstructive pulmonary disease with acute lower respiratory infection: Secondary | ICD-10-CM | POA: Diagnosis not present

## 2021-07-16 DIAGNOSIS — R0602 Shortness of breath: Secondary | ICD-10-CM | POA: Diagnosis not present

## 2021-07-16 DIAGNOSIS — R053 Chronic cough: Secondary | ICD-10-CM | POA: Diagnosis not present

## 2021-07-16 DIAGNOSIS — J9611 Chronic respiratory failure with hypoxia: Secondary | ICD-10-CM | POA: Diagnosis not present

## 2021-07-16 DIAGNOSIS — R69 Illness, unspecified: Secondary | ICD-10-CM | POA: Diagnosis not present

## 2021-07-16 DIAGNOSIS — F17210 Nicotine dependence, cigarettes, uncomplicated: Secondary | ICD-10-CM | POA: Diagnosis not present

## 2021-07-16 DIAGNOSIS — J22 Unspecified acute lower respiratory infection: Secondary | ICD-10-CM | POA: Diagnosis not present

## 2021-07-16 MED ORDER — PREDNISONE 10 MG PO TABS
ORAL_TABLET | ORAL | 0 refills | Status: DC
  Filled 2021-07-16: qty 14, 7d supply, fill #0

## 2021-07-16 MED ORDER — AZITHROMYCIN 250 MG PO TABS
ORAL_TABLET | ORAL | 0 refills | Status: DC
  Filled 2021-07-16: qty 6, 5d supply, fill #0

## 2021-07-22 DIAGNOSIS — J449 Chronic obstructive pulmonary disease, unspecified: Secondary | ICD-10-CM | POA: Diagnosis not present

## 2021-07-22 DIAGNOSIS — R0609 Other forms of dyspnea: Secondary | ICD-10-CM | POA: Diagnosis not present

## 2021-07-28 ENCOUNTER — Other Ambulatory Visit (HOSPITAL_BASED_OUTPATIENT_CLINIC_OR_DEPARTMENT_OTHER): Payer: Self-pay

## 2021-07-28 MED ORDER — PFIZER COVID-19 VAC BIVALENT 30 MCG/0.3ML IM SUSP
INTRAMUSCULAR | 0 refills | Status: DC
  Filled 2021-07-28: qty 0.3, 1d supply, fill #0

## 2021-07-30 DIAGNOSIS — F17210 Nicotine dependence, cigarettes, uncomplicated: Secondary | ICD-10-CM | POA: Diagnosis not present

## 2021-07-30 DIAGNOSIS — J432 Centrilobular emphysema: Secondary | ICD-10-CM | POA: Diagnosis not present

## 2021-07-30 DIAGNOSIS — R69 Illness, unspecified: Secondary | ICD-10-CM | POA: Diagnosis not present

## 2021-08-19 ENCOUNTER — Other Ambulatory Visit (HOSPITAL_BASED_OUTPATIENT_CLINIC_OR_DEPARTMENT_OTHER): Payer: Self-pay

## 2021-08-20 ENCOUNTER — Other Ambulatory Visit (HOSPITAL_BASED_OUTPATIENT_CLINIC_OR_DEPARTMENT_OTHER): Payer: Self-pay

## 2021-08-20 MED ORDER — PREDNISONE 10 MG PO TABS
ORAL_TABLET | ORAL | 0 refills | Status: DC
  Filled 2021-08-20: qty 10, 5d supply, fill #0

## 2021-08-28 ENCOUNTER — Other Ambulatory Visit (HOSPITAL_BASED_OUTPATIENT_CLINIC_OR_DEPARTMENT_OTHER): Payer: Self-pay

## 2021-09-03 DIAGNOSIS — M1990 Unspecified osteoarthritis, unspecified site: Secondary | ICD-10-CM | POA: Diagnosis not present

## 2021-09-03 DIAGNOSIS — G4700 Insomnia, unspecified: Secondary | ICD-10-CM | POA: Diagnosis not present

## 2021-09-03 DIAGNOSIS — R69 Illness, unspecified: Secondary | ICD-10-CM | POA: Diagnosis not present

## 2021-09-03 DIAGNOSIS — Z8249 Family history of ischemic heart disease and other diseases of the circulatory system: Secondary | ICD-10-CM | POA: Diagnosis not present

## 2021-09-03 DIAGNOSIS — J449 Chronic obstructive pulmonary disease, unspecified: Secondary | ICD-10-CM | POA: Diagnosis not present

## 2021-09-03 DIAGNOSIS — I499 Cardiac arrhythmia, unspecified: Secondary | ICD-10-CM | POA: Diagnosis not present

## 2021-09-23 ENCOUNTER — Other Ambulatory Visit: Payer: Self-pay | Admitting: Family

## 2021-09-23 ENCOUNTER — Other Ambulatory Visit (HOSPITAL_BASED_OUTPATIENT_CLINIC_OR_DEPARTMENT_OTHER): Payer: Self-pay

## 2021-09-23 MED ORDER — ESCITALOPRAM OXALATE 10 MG PO TABS
10.0000 mg | ORAL_TABLET | Freq: Every day | ORAL | 0 refills | Status: DC
  Filled 2021-09-23: qty 90, 90d supply, fill #0

## 2021-11-04 ENCOUNTER — Other Ambulatory Visit (HOSPITAL_BASED_OUTPATIENT_CLINIC_OR_DEPARTMENT_OTHER): Payer: Self-pay

## 2021-11-04 DIAGNOSIS — J9611 Chronic respiratory failure with hypoxia: Secondary | ICD-10-CM | POA: Diagnosis not present

## 2021-11-04 DIAGNOSIS — R69 Illness, unspecified: Secondary | ICD-10-CM | POA: Diagnosis not present

## 2021-11-04 DIAGNOSIS — J432 Centrilobular emphysema: Secondary | ICD-10-CM | POA: Diagnosis not present

## 2021-11-04 DIAGNOSIS — F17210 Nicotine dependence, cigarettes, uncomplicated: Secondary | ICD-10-CM | POA: Diagnosis not present

## 2021-11-04 MED ORDER — AZITHROMYCIN 250 MG PO TABS
ORAL_TABLET | ORAL | 0 refills | Status: DC
  Filled 2021-11-04: qty 6, 5d supply, fill #0

## 2021-11-04 MED ORDER — PREDNISONE 10 MG PO TABS
ORAL_TABLET | ORAL | 0 refills | Status: DC
  Filled 2021-11-04: qty 30, 12d supply, fill #0

## 2021-11-11 DIAGNOSIS — J449 Chronic obstructive pulmonary disease, unspecified: Secondary | ICD-10-CM | POA: Diagnosis not present

## 2021-11-11 DIAGNOSIS — J9611 Chronic respiratory failure with hypoxia: Secondary | ICD-10-CM | POA: Diagnosis not present

## 2021-11-20 DIAGNOSIS — R918 Other nonspecific abnormal finding of lung field: Secondary | ICD-10-CM | POA: Diagnosis not present

## 2021-11-20 DIAGNOSIS — R69 Illness, unspecified: Secondary | ICD-10-CM | POA: Diagnosis not present

## 2021-11-20 DIAGNOSIS — J432 Centrilobular emphysema: Secondary | ICD-10-CM | POA: Diagnosis not present

## 2021-11-22 ENCOUNTER — Other Ambulatory Visit: Payer: Self-pay | Admitting: Family

## 2021-12-04 DIAGNOSIS — J9611 Chronic respiratory failure with hypoxia: Secondary | ICD-10-CM | POA: Diagnosis not present

## 2021-12-11 ENCOUNTER — Other Ambulatory Visit (HOSPITAL_BASED_OUTPATIENT_CLINIC_OR_DEPARTMENT_OTHER): Payer: Self-pay

## 2021-12-11 ENCOUNTER — Other Ambulatory Visit: Payer: Self-pay | Admitting: Family

## 2021-12-11 MED ORDER — ESCITALOPRAM OXALATE 10 MG PO TABS
10.0000 mg | ORAL_TABLET | Freq: Every day | ORAL | 0 refills | Status: DC
  Filled 2021-12-11: qty 90, 90d supply, fill #0

## 2021-12-12 DIAGNOSIS — J449 Chronic obstructive pulmonary disease, unspecified: Secondary | ICD-10-CM | POA: Diagnosis not present

## 2021-12-12 DIAGNOSIS — J9611 Chronic respiratory failure with hypoxia: Secondary | ICD-10-CM | POA: Diagnosis not present

## 2021-12-15 ENCOUNTER — Other Ambulatory Visit (HOSPITAL_BASED_OUTPATIENT_CLINIC_OR_DEPARTMENT_OTHER): Payer: Self-pay

## 2021-12-15 MED ORDER — PREDNISONE 10 MG PO TABS
ORAL_TABLET | ORAL | 0 refills | Status: DC
  Filled 2021-12-15: qty 14, 7d supply, fill #0

## 2021-12-15 MED ORDER — AZITHROMYCIN 250 MG PO TABS
ORAL_TABLET | ORAL | 0 refills | Status: DC
  Filled 2021-12-15: qty 6, 5d supply, fill #0

## 2021-12-16 ENCOUNTER — Other Ambulatory Visit (HOSPITAL_BASED_OUTPATIENT_CLINIC_OR_DEPARTMENT_OTHER): Payer: Self-pay

## 2021-12-25 DIAGNOSIS — D225 Melanocytic nevi of trunk: Secondary | ICD-10-CM | POA: Diagnosis not present

## 2021-12-25 DIAGNOSIS — L814 Other melanin hyperpigmentation: Secondary | ICD-10-CM | POA: Diagnosis not present

## 2021-12-25 DIAGNOSIS — L570 Actinic keratosis: Secondary | ICD-10-CM | POA: Diagnosis not present

## 2021-12-25 DIAGNOSIS — L821 Other seborrheic keratosis: Secondary | ICD-10-CM | POA: Diagnosis not present

## 2021-12-25 DIAGNOSIS — L578 Other skin changes due to chronic exposure to nonionizing radiation: Secondary | ICD-10-CM | POA: Diagnosis not present

## 2022-01-04 DIAGNOSIS — J9611 Chronic respiratory failure with hypoxia: Secondary | ICD-10-CM | POA: Diagnosis not present

## 2022-01-11 DIAGNOSIS — J449 Chronic obstructive pulmonary disease, unspecified: Secondary | ICD-10-CM | POA: Diagnosis not present

## 2022-01-11 DIAGNOSIS — J9611 Chronic respiratory failure with hypoxia: Secondary | ICD-10-CM | POA: Diagnosis not present

## 2022-01-21 DIAGNOSIS — J432 Centrilobular emphysema: Secondary | ICD-10-CM | POA: Diagnosis not present

## 2022-01-21 DIAGNOSIS — R918 Other nonspecific abnormal finding of lung field: Secondary | ICD-10-CM | POA: Diagnosis not present

## 2022-01-21 DIAGNOSIS — R69 Illness, unspecified: Secondary | ICD-10-CM | POA: Diagnosis not present

## 2022-02-03 DIAGNOSIS — J9611 Chronic respiratory failure with hypoxia: Secondary | ICD-10-CM | POA: Diagnosis not present

## 2022-02-11 DIAGNOSIS — J449 Chronic obstructive pulmonary disease, unspecified: Secondary | ICD-10-CM | POA: Diagnosis not present

## 2022-02-11 DIAGNOSIS — J9611 Chronic respiratory failure with hypoxia: Secondary | ICD-10-CM | POA: Diagnosis not present

## 2022-02-17 ENCOUNTER — Other Ambulatory Visit (HOSPITAL_BASED_OUTPATIENT_CLINIC_OR_DEPARTMENT_OTHER): Payer: Self-pay

## 2022-02-17 MED ORDER — PREDNISONE 10 MG PO TABS
ORAL_TABLET | ORAL | 0 refills | Status: DC
  Filled 2022-02-17: qty 10, 5d supply, fill #0

## 2022-02-17 MED ORDER — AZITHROMYCIN 250 MG PO TABS
ORAL_TABLET | ORAL | 0 refills | Status: DC
  Filled 2022-02-17: qty 6, 5d supply, fill #0

## 2022-02-23 ENCOUNTER — Other Ambulatory Visit: Payer: Self-pay | Admitting: Family

## 2022-02-24 ENCOUNTER — Other Ambulatory Visit (HOSPITAL_BASED_OUTPATIENT_CLINIC_OR_DEPARTMENT_OTHER): Payer: Self-pay

## 2022-02-24 MED ORDER — ESCITALOPRAM OXALATE 10 MG PO TABS
10.0000 mg | ORAL_TABLET | Freq: Every day | ORAL | 0 refills | Status: DC
  Filled 2022-02-24 – 2022-03-06 (×3): qty 90, 90d supply, fill #0

## 2022-03-04 ENCOUNTER — Other Ambulatory Visit (HOSPITAL_BASED_OUTPATIENT_CLINIC_OR_DEPARTMENT_OTHER): Payer: Self-pay

## 2022-03-04 MED ORDER — PREDNISONE 10 MG PO TABS
ORAL_TABLET | ORAL | 0 refills | Status: DC
  Filled 2022-03-04: qty 30, 12d supply, fill #0

## 2022-03-06 ENCOUNTER — Other Ambulatory Visit (HOSPITAL_BASED_OUTPATIENT_CLINIC_OR_DEPARTMENT_OTHER): Payer: Self-pay

## 2022-03-06 DIAGNOSIS — J9611 Chronic respiratory failure with hypoxia: Secondary | ICD-10-CM | POA: Diagnosis not present

## 2022-03-09 ENCOUNTER — Telehealth: Payer: Self-pay | Admitting: Family

## 2022-03-09 ENCOUNTER — Other Ambulatory Visit: Payer: Self-pay

## 2022-03-09 ENCOUNTER — Encounter: Payer: Self-pay | Admitting: Family

## 2022-03-09 DIAGNOSIS — K8689 Other specified diseases of pancreas: Secondary | ICD-10-CM

## 2022-03-09 MED ORDER — IPRATROPIUM-ALBUTEROL 0.5-2.5 (3) MG/3ML IN SOLN: 3.0000 mL | Freq: Four times a day (QID) | RESPIRATORY_TRACT | 1 refills | Status: DC | PRN

## 2022-03-11 NOTE — Telephone Encounter (Signed)
I am not sure that I sent you a complete message on this patient. Please advise her that the MRI I ordered is to follow up on the 2 small spots noted on her pancreas last year on CT.

## 2022-03-12 NOTE — Telephone Encounter (Signed)
Patient informed of provider's comments. She agrees with plan of care.

## 2022-03-13 DIAGNOSIS — J9611 Chronic respiratory failure with hypoxia: Secondary | ICD-10-CM | POA: Diagnosis not present

## 2022-03-13 DIAGNOSIS — J449 Chronic obstructive pulmonary disease, unspecified: Secondary | ICD-10-CM | POA: Diagnosis not present

## 2022-03-14 ENCOUNTER — Ambulatory Visit (HOSPITAL_BASED_OUTPATIENT_CLINIC_OR_DEPARTMENT_OTHER)
Admission: RE | Admit: 2022-03-14 | Discharge: 2022-03-14 | Disposition: A | Discharge status: Home / Self Care | Payer: Medicare HMO | Source: Ambulatory Visit | Attending: Family | Admitting: Family

## 2022-03-14 DIAGNOSIS — K8689 Other specified diseases of pancreas: Secondary | ICD-10-CM | POA: Diagnosis not present

## 2022-03-14 DIAGNOSIS — N281 Cyst of kidney, acquired: Secondary | ICD-10-CM | POA: Diagnosis not present

## 2022-03-14 DIAGNOSIS — K862 Cyst of pancreas: Secondary | ICD-10-CM | POA: Diagnosis not present

## 2022-03-14 MED ORDER — GADOBUTROL 1 MMOL/ML IV SOLN
5.2000 mL | Freq: Once | INTRAVENOUS | Status: AC | PRN
  Administered 2022-03-14: 5.2 mL via INTRAVENOUS

## 2022-03-20 ENCOUNTER — Other Ambulatory Visit (HOSPITAL_BASED_OUTPATIENT_CLINIC_OR_DEPARTMENT_OTHER): Payer: Self-pay

## 2022-03-20 ENCOUNTER — Encounter: Payer: Self-pay | Admitting: Emergency Medicine

## 2022-03-20 ENCOUNTER — Emergency Department
Admission: EM | Admit: 2022-03-20 | Discharge: 2022-03-20 | Disposition: A | Discharge status: Home / Self Care | Payer: Medicare HMO | Attending: Family Medicine | Admitting: Family Medicine

## 2022-03-20 DIAGNOSIS — R062 Wheezing: Secondary | ICD-10-CM

## 2022-03-20 DIAGNOSIS — R059 Cough, unspecified: Secondary | ICD-10-CM | POA: Diagnosis not present

## 2022-03-20 DIAGNOSIS — U071 COVID-19: Secondary | ICD-10-CM

## 2022-03-20 DIAGNOSIS — J309 Allergic rhinitis, unspecified: Secondary | ICD-10-CM

## 2022-03-20 MED ORDER — BENZONATATE 200 MG PO CAPS
200.0000 mg | ORAL_CAPSULE | Freq: Three times a day (TID) | ORAL | 0 refills | Status: AC | PRN
  Filled 2022-03-20: qty 40, 14d supply, fill #0

## 2022-03-20 MED ORDER — NIRMATRELVIR/RITONAVIR (PAXLOVID)TABLET
3.0000 | ORAL_TABLET | Freq: Two times a day (BID) | ORAL | 0 refills | Status: AC
Start: 2022-03-20 — End: 2022-03-25
  Filled 2022-03-20: qty 30, 5d supply, fill #0

## 2022-03-20 MED ORDER — PREDNISONE 10 MG PO TABS
ORAL_TABLET | Freq: Every day | ORAL | 0 refills | Status: DC
  Filled 2022-03-20: qty 42, 12d supply, fill #0

## 2022-03-20 MED ORDER — FEXOFENADINE HCL 180 MG PO TABS
180.0000 mg | ORAL_TABLET | Freq: Every day | ORAL | 0 refills | Status: DC
  Filled 2022-03-20: qty 30, 30d supply, fill #0

## 2022-03-20 NOTE — ED Provider Notes (Signed)
[name]    CSN: [id number] Arrival date & time: 03/20/22  1058      History   Chief Complaint Chief Complaint  Patient presents with   Cough    COVID POS 03/20/22    HPI Taylor Atkins is a 66 y.o. female.   HPI Very pleasant 66 year old female presents with positive COVID-19 test this morning.  Reports wheezing earlier this morning as well as coughing with fatigue.  PMH significant for COPD, BCC, and lung mass.  Past Medical History:  Diagnosis Date   Acne    Anxiety    Basal cell carcinoma 2021   right forearm   COPD (chronic obstructive pulmonary disease) ([facility])    Goiter    Osteopenia    Pancreatic lesion 03/07/2021   Renal cyst    Skin cancer 02/2014   Squamous cell carcinoma in situ 2021   left shin   Thyroid disease    Tobacco use disorder     Patient Active Problem List   Diagnosis Date Noted   Upper respiratory infection 03/26/2021   Multiple renal cysts 03/07/2021   Pancreatic lesion 03/07/2021   Lung mass 02/26/2021   History of colon polyps 02/26/2021   Fatigue 02/26/2021   Generalized abdominal pain 02/26/2021   Preventative health care 02/26/2021   Basal cell carcinoma 2021   Hyperthyroidism 02/22/2017   Memory problem 06/10/2016   History of skin cancer 05/06/2015   Insomnia 05/06/2015   Multinodular goiter  see ultrasound  2014 12/28/2013   Vertigo 09/20/2013   Renal calculus, left 08/23/2012   COPD (chronic obstructive pulmonary disease) ([place]) 08/22/2012   Hematuria 07/26/2012   Asymptomatic PVCs 07/26/2012   Depression with anxiety    History of tobacco abuse    ALLERGIC REACTION 02/07/2011    Past Surgical History:  Procedure Laterality Date   [place]   COLONOSCOPY  09/27/2015   Dr.Nandigam   EXCISION MASS UPPER EXTREMETIES     [facility]   HEMORRHOID SURGERY  1994   TUBAL LIGATION  1993    OB History     Gravida  3   Para  2   Term      Preterm      AB  1    Living         SAB  1   IAB      Ectopic      Multiple      Live Births               Home Medications    Prior to Admission medications   Medication Sig Start Date End Date Taking? Authorizing Provider  benzonatate (TESSALON) 200 MG capsule Take 1 capsule (200 mg total) by mouth 3 (three) times daily as needed for up to 7 days for cough. 03/20/22 04/03/22 Yes Eliezer Lofts, FNP  fexofenadine [facility] ALLERGY) 180 MG tablet Take 1 tablet (180 mg total) by mouth daily 03/20/22 04/19/22 Yes Eliezer Lofts, FNP  nirmatrelvir/ritonavir EUA (PAXLOVID) 20 x 150 MG & 10 x '100MG'$  TABS Take 3 tablets by mouth 2 (two) times daily for 5 days. Patient GFR is 65.68. Take nirmatrelvir (150 mg) two tablets twice daily for 5 days and ritonavir (100 mg) one tablet twice daily for 5 days. 03/20/22 03/25/22 Yes Eliezer Lofts, FNP  predniSONE (DELTASONE) 10 MG tablet Take 6 tablets by mouth daily for 2 days, then 5 tablets for 2 days, then 4 tablets  for 2 days, then 3 tablets for 2 days, then 2 tablets for 2 days, then 1 tablet by mouth daily for 2 days 03/20/22  Yes Eliezer Lofts, FNP  albuterol (VENTOLIN HFA) 108 (90 Base) MCG/ACT inhaler Inhale 2 puffs into the lungs every 6 (six) hours as needed for up to 30 days for Wheezing or Shortness of Breath. 04/30/21     Calcium Carbonate-Vitamin D 600-400 MG-UNIT per chew tablet Chew 1 tablet by mouth 2 (two) times daily. 05/28/15   Debbrah Alar, NP  COVID-19 mRNA bivalent vaccine, Pfizer, (PFIZER COVID-19 VAC BIVALENT) injection Inject into the muscle. 07/04/21   Carlyle Basques, MD  escitalopram (LEXAPRO) 10 MG tablet Take 1 tablet (10 mg total) by mouth daily. 02/24/22   Debbrah Alar, NP  ibandronate (BONIVA) 150 MG tablet Take 1 tablet (150 mg total) by mouth every 30 (thirty) days. Take in the morning with a full glass of water, on an empty stomach, and do not take anything else by mouth or lie down for the next 30 min. 03/03/18   Debbrah Alar, NP  ipratropium-albuterol (DUONEB) 0.5-2.5 (3) MG/3ML SOLN Take 3 mLs by nebulization every 6 (six) hours as needed. 03/09/22   Debbrah Alar, NP  meclizine (ANTIVERT) 25 MG tablet Take one tablet every 8 hours as needed for dizziness 09/20/13   Schoenhoff, Altamese Cabal, MD  Tiotropium Bromide-Olodaterol (STIOLTO RESPIMAT) 2.5-2.5 MCG/ACT AERS Inhale into the lungs daily.    [provider]  traZODone (DESYREL) 50 MG tablet TAKE 1/2 TO 1 TABLET BY MOUTH EVERY NIGHT AT BEDTIME AS NEEDED FOR SLEEP 11/23/21   Debbrah Alar, NP  buPROPion (WELLBUTRIN XL) 300 MG 24 hr tablet Take 1 tablet (300 mg total) by mouth daily. Patient not taking: No sig reported 03/27/19 10/14/19  Debbrah Alar, NP    Family History Family History  Problem Relation Age of Onset   Hypertension Mother        Afib, living   Dementia Mother    Heart disease Father        Afib, pacemaker, PVD   Hypertension Father    Diabetes Brother    Hypertension Brother    Diabetes Brother    Hypertension Brother    Colon cancer Neg Hx    Esophageal cancer Neg Hx    Rectal cancer Neg Hx    Stomach cancer Neg Hx    Colon polyps Neg Hx     Social History Social History   Tobacco Use   Smoking status: Every Day    Packs/day: 0.50    Years: 40.00    Total pack years: 20.00    Types: Cigarettes    Last attempt to quit: 01/13/2016    Years since quitting: 6.1   Smokeless tobacco: Never  Vaping Use   Vaping Use: Never used  Substance Use Topics   Alcohol use: Not Currently   Drug use: No     Allergies   Fosamax [alendronate sodium]   Review of Systems Review of Systems  Constitutional:  Positive for fatigue.  Respiratory:  Positive for wheezing.   All other systems reviewed and are negative.    Physical Exam Triage Vital Signs ED Triage Vitals  Enc Vitals Group     BP      Pulse      Resp      Temp      Temp src      SpO2      Weight  Height      Head Circumference       Peak Flow      Pain Score      Pain Loc      Pain Edu?      Excl. in [employer]?    No data found.  Updated Vital Signs BP 126/66 (BP Location: Right Arm)   Pulse 82   Temp 99.1 F (37.3 C) (Oral)   Resp 20   SpO2 94%      Physical Exam Vitals and nursing note reviewed.  Constitutional:      Appearance: Normal appearance. She is normal weight. She is ill-appearing.  HENT:     Head: Normocephalic and atraumatic.     Right Ear: Tympanic membrane, ear canal and external ear normal.     Left Ear: Tympanic membrane, ear canal and external ear normal.     Mouth/Throat:     Mouth: Mucous membranes are moist.     Pharynx: Oropharynx is clear.     Comments: Moderate to significant amount of clear drainage of posterior oropharynx noted Eyes:     Extraocular Movements: Extraocular movements intact.     Conjunctiva/sclera: Conjunctivae normal.     Pupils: Pupils are equal, round, and reactive to light.  Cardiovascular:     Rate and Rhythm: Normal rate and regular rhythm.     Pulses: Normal pulses.     Heart sounds: Normal heart sounds.  Pulmonary:     Effort: Pulmonary effort is normal.     Breath sounds: Wheezing present. No rhonchi or rales.     Comments: Audible wheezing noted throughout, infrequent nonproductive cough noted on exam Musculoskeletal:     Cervical back: Normal range of motion and neck supple.  Skin:    General: Skin is warm and dry.  Neurological:     General: No focal deficit present.     Mental Status: She is alert and oriented to person, place, and time.      UC Treatments / Results  Labs (all labs ordered are listed, but only abnormal results are displayed) Labs Reviewed - No data to display  EKG   Radiology No results found.  Procedures Procedures (including critical care time)  Medications Ordered in UC Medications - No data to display  Initial Impression / Assessment and Plan / UC Course  I have reviewed the triage vital signs and the nursing  notes.  Pertinent labs & imaging results that were available during my care of the patient were reviewed by me and considered in my medical decision making (see chart for details).     MDM: 1.  COVID-19-Rx'd Paxlovid; 2.  Wheezes-Rx'd Sterapred-unipak; 3. Cough-Rx'd Tessalon Perles; 4.  Allergic Rhinitis-Rx'd Allegra. Instructed patient to take medication as directed with food to completion.  Advised patient to take prednisone and Allegra with first dose of Paxlovid for the next 5 of 10 days.  Advised may take Tessalon Perles daily or as needed for cough.  Encouraged patient to increase daily water intake while taking these medications.  Advised patient to remain self quarantine for 5 days or until Wednesday, 03/25/2022.  Advised patient if symptoms worsen and/or unresolved please follow-up with PCP or here for further evaluation.  Patient discharged home, hemodynamically stable.  Final Clinical Impressions(s) / UC Diagnoses   Final diagnoses:  COVID-19  Cough, unspecified type  Allergic rhinitis, unspecified seasonality, unspecified trigger  Wheezes     Discharge Instructions      Instructed patient to take medication  as directed with food to completion.  Advised patient to take prednisone and Allegra with first dose of Paxlovid for the next 5 of 10 days.  Advised may take Tessalon Perles daily or as needed for cough.  Encouraged patient to increase daily water intake while taking these medications.  Advised patient to remain self quarantine for 5 days or until Wednesday, 03/25/2022.  Advised patient if symptoms worsen and/or unresolved please follow-up with PCP or here for further evaluation.     ED Prescriptions     Medication Sig Dispense Auth. Provider   nirmatrelvir/ritonavir EUA (PAXLOVID) 20 x 150 MG & 10 x '100MG'$  TABS Take 3 tablets by mouth 2 (two) times daily for 5 days. Patient GFR is 65.68. Take nirmatrelvir (150 mg) two tablets twice daily for 5 days and ritonavir (100 mg)  one tablet twice daily for 5 days. 30 tablet Eliezer Lofts, FNP   predniSONE (DELTASONE) 10 MG tablet Take 6 tablets by mouth daily for 2 days, then 5 tablets for 2 days, then 4 tablets for 2 days, then 3 tablets for 2 days, then 2 tablets for 2 days, then 1 tablet by mouth daily for 2 days 42 tablet Eliezer Lofts, FNP   benzonatate (TESSALON) 200 MG capsule Take 1 capsule (200 mg total) by mouth 3 (three) times daily as needed for up to 7 days for cough. 40 capsule Eliezer Lofts, FNP   fexofenadine [facility] ALLERGY) 180 MG tablet Take 1 tablet (180 mg total) by mouth daily 30 tablet Eliezer Lofts, FNP      PDMP not reviewed this encounter.   Eliezer Lofts, [place] 03/20/22 1155

## 2022-03-20 NOTE — Discharge Instructions (Addendum)
Instructed patient to take medication as directed with food to completion.  Advised patient to take prednisone and Allegra with first dose of Paxlovid for the next 5 of 10 days.  Advised may take Tessalon Perles daily or as needed for cough.  Encouraged patient to increase daily water intake while taking these medications.  Advised patient to remain self quarantine for 5 days or until Wednesday, 03/25/2022.  Advised patient if symptoms worsen and/or unresolved please follow-up with PCP or here for further evaluation.

## 2022-03-20 NOTE — ED Triage Notes (Signed)
Pt states yesterday she started feeling bad and fatigue and coughing. She tested positive at home this am for covid, she has been noticing some wheezing this am.

## 2022-04-05 DIAGNOSIS — J9611 Chronic respiratory failure with hypoxia: Secondary | ICD-10-CM | POA: Diagnosis not present

## 2022-04-07 DIAGNOSIS — J432 Centrilobular emphysema: Secondary | ICD-10-CM | POA: Diagnosis not present

## 2022-04-07 DIAGNOSIS — R69 Illness, unspecified: Secondary | ICD-10-CM | POA: Diagnosis not present

## 2022-04-13 DIAGNOSIS — J449 Chronic obstructive pulmonary disease, unspecified: Secondary | ICD-10-CM | POA: Diagnosis not present

## 2022-04-13 DIAGNOSIS — J9611 Chronic respiratory failure with hypoxia: Secondary | ICD-10-CM | POA: Diagnosis not present

## 2022-04-17 DIAGNOSIS — F17210 Nicotine dependence, cigarettes, uncomplicated: Secondary | ICD-10-CM | POA: Diagnosis not present

## 2022-04-17 DIAGNOSIS — J439 Emphysema, unspecified: Secondary | ICD-10-CM | POA: Diagnosis not present

## 2022-04-17 DIAGNOSIS — J432 Centrilobular emphysema: Secondary | ICD-10-CM | POA: Diagnosis not present

## 2022-04-17 DIAGNOSIS — R69 Illness, unspecified: Secondary | ICD-10-CM | POA: Diagnosis not present

## 2022-04-17 DIAGNOSIS — Z122 Encounter for screening for malignant neoplasm of respiratory organs: Secondary | ICD-10-CM | POA: Diagnosis not present

## 2022-04-20 ENCOUNTER — Other Ambulatory Visit (HOSPITAL_BASED_OUTPATIENT_CLINIC_OR_DEPARTMENT_OTHER): Payer: Self-pay

## 2022-04-20 ENCOUNTER — Ambulatory Visit (HOSPITAL_BASED_OUTPATIENT_CLINIC_OR_DEPARTMENT_OTHER)
Admission: RE | Admit: 2022-04-20 | Discharge: 2022-04-20 | Disposition: A | Discharge status: Home / Self Care | Payer: Medicare HMO | Source: Ambulatory Visit | Attending: Medical | Admitting: Medical

## 2022-04-20 ENCOUNTER — Ambulatory Visit (INDEPENDENT_AMBULATORY_CARE_PROVIDER_SITE_OTHER): Payer: Medicare HMO | Admitting: Medical

## 2022-04-20 VITALS — BP 108/59 | HR 85 | Temp 98.5°F | Resp 18 | Wt 115.0 lb

## 2022-04-20 DIAGNOSIS — H6121 Impacted cerumen, right ear: Secondary | ICD-10-CM

## 2022-04-20 DIAGNOSIS — R059 Cough, unspecified: Secondary | ICD-10-CM

## 2022-04-20 DIAGNOSIS — H9201 Otalgia, right ear: Secondary | ICD-10-CM | POA: Diagnosis not present

## 2022-04-20 DIAGNOSIS — J441 Chronic obstructive pulmonary disease with (acute) exacerbation: Secondary | ICD-10-CM | POA: Diagnosis not present

## 2022-04-20 DIAGNOSIS — R062 Wheezing: Secondary | ICD-10-CM

## 2022-04-20 DIAGNOSIS — J40 Bronchitis, not specified as acute or chronic: Secondary | ICD-10-CM

## 2022-04-20 DIAGNOSIS — J449 Chronic obstructive pulmonary disease, unspecified: Secondary | ICD-10-CM | POA: Diagnosis not present

## 2022-04-20 DIAGNOSIS — R0902 Hypoxemia: Secondary | ICD-10-CM | POA: Diagnosis not present

## 2022-04-20 DIAGNOSIS — R0600 Dyspnea, unspecified: Secondary | ICD-10-CM | POA: Diagnosis not present

## 2022-04-20 LAB — CBC WITH DIFFERENTIAL/PLATELET
Basophils Absolute: 0 10*3/uL (ref 0.0–0.1)
Basophils Relative: 0.5 % (ref 0.0–3.0)
Eosinophils Absolute: 0.5 10*3/uL (ref 0.0–0.7)
Eosinophils Relative: 5.8 % — ABNORMAL HIGH (ref 0.0–5.0)
HCT: 41.9 % (ref 36.0–46.0)
Hemoglobin: 13.9 g/dL (ref 12.0–15.0)
Lymphocytes Relative: 27.6 % (ref 12.0–46.0)
Lymphs Abs: 2.4 10*3/uL (ref 0.7–4.0)
MCHC: 33.2 g/dL (ref 30.0–36.0)
MCV: 90.3 fl (ref 78.0–100.0)
Monocytes Absolute: 0.7 10*3/uL (ref 0.1–1.0)
Monocytes Relative: 8.2 % (ref 3.0–12.0)
Neutro Abs: 5 10*3/uL (ref 1.4–7.7)
Neutrophils Relative %: 57.9 % (ref 43.0–77.0)
Platelets: 228 10*3/uL (ref 150.0–400.0)
RBC: 4.64 Mil/uL (ref 3.87–5.11)
RDW: 13.7 % (ref 11.5–15.5)
WBC: 8.6 10*3/uL (ref 4.0–10.5)

## 2022-04-20 MED ORDER — IPRATROPIUM-ALBUTEROL 0.5-2.5 (3) MG/3ML IN SOLN
3.0000 mL | Freq: Four times a day (QID) | RESPIRATORY_TRACT | 1 refills | Status: AC | PRN
  Filled 2022-04-20 (×2): qty 180, 15d supply, fill #0

## 2022-04-20 MED ORDER — DOXYCYCLINE HYCLATE 100 MG PO TABS
100.0000 mg | ORAL_TABLET | Freq: Two times a day (BID) | ORAL | 0 refills | Status: DC
  Filled 2022-04-20: qty 20, 10d supply, fill #0

## 2022-04-20 MED ORDER — METHYLPREDNISOLONE ACETATE 80 MG/ML IJ SUSP
80.0000 mg | Freq: Once | INTRAMUSCULAR | Status: AC
  Administered 2022-04-20: 80 mg via INTRAMUSCULAR

## 2022-04-20 MED ORDER — CEFTRIAXONE SODIUM 1 G IJ SOLR
1.0000 g | Freq: Once | INTRAMUSCULAR | Status: AC
  Administered 2022-04-20: 1 g via INTRAMUSCULAR

## 2022-04-20 MED ORDER — PREDNISONE 10 MG PO TABS
ORAL_TABLET | ORAL | 0 refills | Status: DC
  Filled 2022-04-20: qty 21, 6d supply, fill #0

## 2022-04-20 NOTE — Addendum Note (Signed)
Addended by: Jiles Prows on: 04/20/2022 03:44 PM   Modules accepted: Orders

## 2022-04-20 NOTE — Progress Notes (Signed)
Subjective:    Patient ID: Taylor Atkins, female    DOB: 1955-12-17, 66 y.o.   MRN: [id number]  HPI  Pt in for recent ear aches. She states Saturday got ear ache. Pt states she has recent chest congestion for 2-3 weeks. She states feels like brings up mucus but then will swallow he mucus.  Pt has been wheezing for 2 weeks. Has some gurgling like sounds.  Fifty pack-year history. Emphysema.  Pt 02 sat percentage is 92-93% on deep inspiration. She states usually her 02 sat is 93-95%.  Pt had CT chest on 04-18-2023. Did find in epic.   IMPRESSION:  1. Lung-RADS 2, benign appearance or behavior. Continue annual  screening with low-dose chest CT without contrast in 12 months.  2. Aortic atherosclerosis.  3. Mild diffuse bronchial wall thickening with moderate  centrilobular and paraseptal emphysema; imaging findings suggestive  of underlying COPD.   Pt expresses she does not want to go to ED. Thinks she does not need to go to ED. Describes opposed to this presently.  Pt is on duoneb, stiolto and proair.     Review of Systems  Constitutional:  Negative for chills, fatigue and fever.  HENT:  Negative for dental problem.   Respiratory:  Negative for cough, chest tightness, shortness of breath and wheezing.   Cardiovascular:  Negative for chest pain and palpitations.  Gastrointestinal:  Negative for abdominal pain, blood in stool and diarrhea.  Genitourinary:  Negative for dysuria, flank pain, frequency and urgency.  Musculoskeletal:  Negative for arthralgias, gait problem and neck pain.  Skin:  Negative for rash.    Past Medical History:  Diagnosis Date   Acne    Anxiety    Basal cell carcinoma 2021   right forearm   COPD (chronic obstructive pulmonary disease) (HCC)    Goiter    Osteopenia    Pancreatic lesion 03/07/2021   Renal cyst    Skin cancer 02/2014   Squamous cell carcinoma in situ 2021   left shin   Thyroid disease    Tobacco use disorder       Social History   Socioeconomic History   Marital status: Married    Spouse name: Not on file   Number of children: Not on file   Years of education: Not on file   Highest education level: Not on file  Occupational History   Not on file  Tobacco Use   Smoking status: Every Day    Packs/day: 0.50    Years: 40.00    Total pack years: 20.00    Types: Cigarettes    Last attempt to quit: 01/13/2016    Years since quitting: 6.2   Smokeless tobacco: Never  Vaping Use   Vaping Use: Never used  Substance and Sexual Activity   Alcohol use: Not Currently   Drug use: No   Sexual activity: Yes  Other Topics Concern   Not on file  Social History Narrative   Married   2 sons (one in college) one about to move out   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, [place]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, raises cows   Retired [employer]   Enjoys resting    Social Determinants of Health   Financial Resource Strain: Low Risk  (05/01/2021)   Overall Financial Resource Strain (CARDIA)    Difficulty of Paying Living Expenses: Not hard at all  Food Insecurity: No Food Insecurity (05/01/2021)   Hunger Vital Sign    Worried About Running Out of Food in the  Last Year: Never true    [place] in the Last Year: Never true  Transportation Needs: No Transportation Needs (05/01/2021)   PRAPARE - Hydrologist (Medical): No    Lack of Transportation (Non-Medical): No  Physical Activity: Inactive (05/01/2021)   Exercise Vital Sign    Days of Exercise per Week: 0 days    Minutes of Exercise per Session: 0 min  Stress: Stress Concern Present (05/01/2021)   [place]    Feeling of Stress : To some extent  Social Connections: Moderately Isolated (05/01/2021)   Social Connection and Isolation Panel [NHANES]    Frequency of Communication with Friends and Family: More than three times a week    Frequency of Social Gatherings with Friends and Family: More than three times  a week    Attends Religious Services: Never    Marine scientist or Organizations: No    Attends Archivist Meetings: Never    Marital Status: Married  Human resources officer Violence: Not At Risk (05/01/2021)   Humiliation, Afraid, Rape, and Kick questionnaire    Fear of Current or Ex-Partner: No    Emotionally Abused: No    Physically Abused: No    Sexually Abused: No    Past Surgical History:  Procedure Laterality Date   BUNIONECTOMY  [place]  09/27/2015   Dr.Nandigam   EXCISION MASS UPPER EXTREMETIES     [facility]   HEMORRHOID [place]    Family History  Problem Relation Age of Onset   Hypertension Mother        Afib, living   Dementia Mother    Heart disease Father        Afib, pacemaker, PVD   Hypertension Father    Diabetes Brother    Hypertension Brother    Diabetes Brother    Hypertension Brother    Colon cancer Neg Hx    Esophageal cancer Neg Hx    Rectal cancer Neg Hx    Stomach cancer Neg Hx    Colon polyps Neg Hx     Allergies  Allergen Reactions   Fosamax [Alendronate Sodium] Other (See Comments)    Extreme nausea    Current Outpatient Medications on File Prior to Visit  Medication Sig Dispense Refill   albuterol (VENTOLIN HFA) 108 (90 Base) MCG/ACT inhaler Inhale 2 puffs into the lungs every 6 (six) hours as needed for up to 30 days for Wheezing or Shortness of Breath. 18 g 5   Calcium Carbonate-Vitamin D 600-400 MG-UNIT per chew tablet Chew 1 tablet by mouth 2 (two) times daily.     COVID-19 mRNA bivalent vaccine, Pfizer, (PFIZER COVID-19 VAC BIVALENT) injection Inject into the muscle. 0.3 mL 0   escitalopram (LEXAPRO) 10 MG tablet Take 1 tablet (10 mg total) by mouth daily. 90 tablet 0   ibandronate (BONIVA) 150 MG tablet Take 1 tablet (150 mg total) by mouth every 30 (thirty) days. Take in the morning with a full glass of water, on an empty stomach, and do not take  anything else by mouth or lie down for the next 30 min. 3 tablet 4   ipratropium-albuterol (DUONEB) 0.5-2.5 (3) MG/3ML SOLN Take 3 mLs by nebulization every 6 (six) hours as needed. 360 mL 1   meclizine (ANTIVERT) 25 MG tablet Take one tablet every 8  hours as needed for dizziness 30 tablet 0   Tiotropium Bromide-Olodaterol (STIOLTO RESPIMAT) 2.5-2.5 MCG/ACT AERS Inhale into the lungs daily.     traZODone (DESYREL) 50 MG tablet TAKE 1/2 TO 1 TABLET BY MOUTH EVERY NIGHT AT BEDTIME AS NEEDED FOR SLEEP 90 tablet 1   fexofenadine (ALLEGRA ALLERGY) 180 MG tablet Take 1 tablet (180 mg total) by mouth daily 30 tablet 0   [DISCONTINUED] buPROPion (WELLBUTRIN XL) 300 MG 24 hr tablet Take 1 tablet (300 mg total) by mouth daily. (Patient not taking: Reported on 06/16/2019) 30 tablet 5   No current facility-administered medications on file prior to visit.    BP (!) 108/59 (BP Location: Right Arm, Patient Position: Sitting, Cuff Size: Small)   Pulse 85   Temp 98.5 F (36.9 C) (Oral)   Resp 18   Wt 115 lb (52.2 kg)   SpO2 92%   BMI 20.37 kg/m        Objective:   Physical Exam  General- No acute distress. Pleasant patient. Neck- Full range of motion, no jvd Lungs- Clear, even and unlabored. Heart- regular rate and rhythm. Neurologic- CNII- XII grossly intact.  Heent- no sinus pressure. Rt ear canal. Large amount of wax seen. Can't see tm at all. Lungs-scattered rough breath sounds with expiratory wheeze.  Symmetric bilaterally. Lower extremity-calves thin and symmetric.  No swelling and negative Homans' sign bilaterally.      Assessment & Plan:   Patient Instructions  Bronchitis with copd. Copd flare  with scattered rough breath sounds with expiratory wheezing.  Your O2 sat percentage is 92 to 93%.  Did discuss most cautious approach with such a low percentage O2 would be emergency department evaluation.  You expressed a strong desire not to go to the emergency department.  In light of this  decided we would do you Depo-Medrol IM, 6-day taper dose of prednisone,and refilled your DuoNeb solution.  Continue Merchant navy officer.  Please make sure you are checking oxygen daily and watch her oxygen to increase.  No increase in oxygen percentage or clinically worsening that have to advise emergency department evaluation.  I will get a chest x-ray today to evaluate for pneumonia and get CBC stat.  Note if chest x-ray shows pneumonia or if you have significant elevated WBC level will advise emergency department as well.  Prescribing doxycycline antibiotic for bronchitis.  I decided also to give her 1 g Rocephin IM. You report severe right ear pain but I cannot see that tympanic membrane due to large amount of wax.  Recommend using Debrox over-the-counter to soften wax.  Follow-up in 3 days with PCP or sooner if needed.   Mackie Pai, PA-C

## 2022-04-20 NOTE — Patient Instructions (Addendum)
Bronchitis with copd. Copd flare  with scattered rough breath sounds with expiratory wheezing.  Your O2 sat percentage is 92 to 93%.  Did discuss most cautious approach with such a low percentage O2 would be emergency department evaluation.  You expressed a strong desire not to go to the emergency department.  In light of this decided we would do you Depo-Medrol IM, 6-day taper dose of prednisone,and refilled your DuoNeb solution.  Continue Merchant navy officer.  Please make sure you are checking oxygen daily and watch her oxygen to increase.  No increase in oxygen percentage or clinically worsening that have to advise emergency department evaluation.  I will get a chest x-ray today to evaluate for pneumonia and get CBC stat.  Note if chest x-ray shows pneumonia or if you have significant elevated WBC level will advise emergency department as well.  Prescribing doxycycline antibiotic for bronchitis.  I decided also to give her 1 g Rocephin IM. You report severe right ear pain but I cannot see that tympanic membrane due to large amount of wax.  Recommend using Debrox over-the-counter to soften wax.  Follow-up in 3 days with PCP or sooner if needed.

## 2022-04-21 ENCOUNTER — Other Ambulatory Visit (HOSPITAL_BASED_OUTPATIENT_CLINIC_OR_DEPARTMENT_OTHER): Payer: Self-pay

## 2022-04-24 ENCOUNTER — Encounter: Payer: Self-pay | Admitting: Family

## 2022-04-24 ENCOUNTER — Other Ambulatory Visit (HOSPITAL_BASED_OUTPATIENT_CLINIC_OR_DEPARTMENT_OTHER): Payer: Self-pay

## 2022-04-24 ENCOUNTER — Ambulatory Visit (INDEPENDENT_AMBULATORY_CARE_PROVIDER_SITE_OTHER): Payer: Medicare HMO | Admitting: Family

## 2022-04-24 VITALS — BP 100/60 | HR 50 | Temp 98.1°F | Ht 63.0 in | Wt 116.6 lb

## 2022-04-24 DIAGNOSIS — H6121 Impacted cerumen, right ear: Secondary | ICD-10-CM | POA: Diagnosis not present

## 2022-04-24 NOTE — Progress Notes (Signed)
Taylor Atkins is a 66 y.o. female with the following history as recorded in Epic[facility]:  Patient Active Problem List   Diagnosis Date Noted   Upper respiratory infection 03/26/2021   Multiple renal cysts 03/07/2021   Pancreatic lesion 03/07/2021   Lung mass 02/26/2021   History of colon polyps 02/26/2021   Fatigue 02/26/2021   Generalized abdominal pain 02/26/2021   Preventative health care 02/26/2021   Basal cell carcinoma 2021   Hyperthyroidism 02/22/2017   Memory problem 06/10/2016   History of skin cancer 05/06/2015   Insomnia 05/06/2015   Multinodular goiter  see ultrasound  2014 12/28/2013   Vertigo 09/20/2013   Renal calculus, left 08/23/2012   COPD (chronic obstructive pulmonary disease) ([place]) 08/22/2012   Hematuria 07/26/2012   Asymptomatic PVCs 07/26/2012   Depression with anxiety    History of tobacco abuse    ALLERGIC REACTION 02/07/2011    Current Outpatient Medications  Medication Sig Dispense Refill   albuterol (VENTOLIN HFA) 108 (90 Base) MCG/ACT inhaler Inhale 2 puffs into the lungs every 6 (six) hours as needed for up to 30 days for Wheezing or Shortness of Breath. 18 g 5   Calcium Carbonate-Vitamin D 600-400 MG-UNIT per chew tablet Chew 1 tablet by mouth 2 (two) times daily.     doxycycline (VIBRA-TABS) 100 MG tablet Take 1 tablet (100 mg total) by mouth 2 (two) times daily. 20 tablet 0   escitalopram (LEXAPRO) 10 MG tablet Take 1 tablet (10 mg total) by mouth daily. 90 tablet 0   ibandronate (BONIVA) 150 MG tablet Take 1 tablet (150 mg total) by mouth every 30 (thirty) days. Take in the morning with a full glass of water, on an empty stomach, and do not take anything else by mouth or lie down for the next 30 min. 3 tablet 4   ipratropium-albuterol (DUONEB) 0.5-2.5 (3) MG/3ML SOLN Take 3 mLs by nebulization every 6 (six) hours as needed. 360 mL 1   meclizine (ANTIVERT) 25 MG tablet Take one tablet every 8 hours as needed for dizziness 30 tablet 0    predniSONE (DELTASONE) 10 MG tablet Take 6 tablets by mouth daily for 1 day, then 5 tabs for 1 day, then 4 tabs for 1 day, then 3 tabs for 1 day then 2 tabs for 1 day, then 1 tab for 1 day 21 tablet 0   Tiotropium Bromide-Olodaterol (STIOLTO RESPIMAT) 2.5-2.5 MCG/ACT AERS Inhale into the lungs daily.     traZODone (DESYREL) 50 MG tablet TAKE 1/2 TO 1 TABLET BY MOUTH EVERY NIGHT AT BEDTIME AS NEEDED FOR SLEEP 90 tablet 1   fexofenadine (ALLEGRA ALLERGY) 180 MG tablet Take 1 tablet (180 mg total) by mouth daily 30 tablet 0   No current facility-administered medications for this visit.    Allergies: Fosamax [alendronate sodium]  Past Medical History:  Diagnosis Date   Acne    Anxiety    Basal cell carcinoma 2021   right forearm   COPD (chronic obstructive pulmonary disease) ([place])    Goiter    Osteopenia    Pancreatic lesion 03/07/2021   Renal cyst    Skin cancer 02/2014   Squamous cell carcinoma in situ 2021   left shin   Thyroid disease    Tobacco use disorder     Past Surgical History:  Procedure Laterality Date   [place]   COLONOSCOPY  09/27/2015   Dr.Nandigam   EXCISION MASS UPPER EXTREMETIES  [facility]   HEMORRHOID SURGERY  1994   TUBAL LIGATION  1993    Family History  Problem Relation Age of Onset   Hypertension Mother        Afib, living   Dementia Mother    Heart disease Father        Afib, pacemaker, PVD   Hypertension Father    Diabetes Brother    Hypertension Brother    Diabetes Brother    Hypertension Brother    Colon cancer Neg Hx    Esophageal cancer Neg Hx    Rectal cancer Neg Hx    Stomach cancer Neg Hx    Colon polyps Neg Hx     Social History   Tobacco Use   Smoking status: Every Day    Packs/day: 0.50    Years: 40.00    Total pack years: 20.00    Types: Cigarettes    Last attempt to quit: 01/13/2016    Years since quitting: 6.2   Smokeless tobacco: Never  Substance Use Topics   Alcohol use: Not  Currently    Subjective:  Seen earlier this week with COPD flare- doing better on combination of prednisone and antibiotics; is scheduled to see her pulmonologist next week; During that exam, right ear was noted to have marked right cerumen impaction; has been using drops and is here for lavage;      Objective:  Vitals:   04/24/22 1052  BP: 100/60  Pulse: (!) 50  Temp: 98.1 F (36.7 C)  TempSrc: Oral  SpO2: 93%  Weight: 116 lb 9.6 oz (52.9 kg)  Height: '5\' 3"'$  (1.6 m)    General: Well developed, well nourished, in no acute distress  Skin : Warm and dry.  Head: Normocephalic and atraumatic  Eyes: Sclera and conjunctiva clear; pupils round and reactive to light; extraocular movements intact  Ears: External normal; canals clear; after lavage, unable to visualize TM Oropharynx: Pink, supple. No suspicious lesions  Neck: Supple without thyromegaly, adenopathy  Lungs: Respirations unlabored; wheezing noted; Neurologic: Alert and oriented; speech intact; face symmetrical; moves all extremities well; CNII-XII intact without focal deficit   Assessment:  1. Impacted cerumen of right ear     Plan:  Ear lavage was not successful; extensive wax still noted in ear; due to patient's discomfort, opted to stop and refer to ENT;  She will also keep her planned follow up with pulmonology next week.   No follow-ups on file.  Orders Placed This Encounter  Procedures   Ambulatory referral to ENT    Referral Priority:   Routine    Referral Type:   Consultation    Referral Reason:   Specialty Services Required    Requested Specialty:   Otolaryngology    Number of Visits Requested:   1    Requested Prescriptions    No prescriptions requested or ordered in this encounter

## 2022-04-29 DIAGNOSIS — J432 Centrilobular emphysema: Secondary | ICD-10-CM | POA: Diagnosis not present

## 2022-04-29 DIAGNOSIS — H93291 Other abnormal auditory perceptions, right ear: Secondary | ICD-10-CM | POA: Diagnosis not present

## 2022-04-29 DIAGNOSIS — Z9981 Dependence on supplemental oxygen: Secondary | ICD-10-CM | POA: Diagnosis not present

## 2022-04-29 DIAGNOSIS — F17210 Nicotine dependence, cigarettes, uncomplicated: Secondary | ICD-10-CM | POA: Diagnosis not present

## 2022-04-29 DIAGNOSIS — J9611 Chronic respiratory failure with hypoxia: Secondary | ICD-10-CM | POA: Diagnosis not present

## 2022-04-29 DIAGNOSIS — R69 Illness, unspecified: Secondary | ICD-10-CM | POA: Diagnosis not present

## 2022-04-29 DIAGNOSIS — H6121 Impacted cerumen, right ear: Secondary | ICD-10-CM | POA: Diagnosis not present

## 2022-05-06 DIAGNOSIS — J9611 Chronic respiratory failure with hypoxia: Secondary | ICD-10-CM | POA: Diagnosis not present

## 2022-05-07 ENCOUNTER — Ambulatory Visit (INDEPENDENT_AMBULATORY_CARE_PROVIDER_SITE_OTHER): Payer: Medicare HMO | Admitting: *Deleted

## 2022-05-07 VITALS — BP 112/59 | HR 89 | Ht 63.0 in | Wt 117.2 lb

## 2022-05-07 DIAGNOSIS — Z1231 Encounter for screening mammogram for malignant neoplasm of breast: Secondary | ICD-10-CM

## 2022-05-07 DIAGNOSIS — Z0000 Encounter for general adult medical examination without abnormal findings: Secondary | ICD-10-CM

## 2022-05-07 NOTE — Progress Notes (Signed)
Subjective:   Taylor Atkins is a 66 y.o. female who presents for Medicare Annual (Subsequent) preventive examination.  Review of Systems    Defer to PCP Cardiac Risk Factors include: advanced age (>49mn, >>23women)     Objective:    Today's Vitals   05/07/22 0901  BP: (!) 112/59  Pulse: 89  Weight: 117 lb 3.2 oz (53.2 kg)  Height: '5\' 3"'$  (1.6 m)   Body mass index is 20.76 kg/m.     05/07/2022    9:07 AM 06/04/2021    9:32 AM 05/01/2021    9:02 AM 09/27/2015    7:17 AM 08/07/2015    7:54 AM  Advanced Directives  Does Patient Have a Medical Advance Directive? No;Yes Yes Yes Yes Yes  Type of Advance Directive Living will;Healthcare Power of Attorney Living will [place] will  [place] will  Does patient want to make changes to medical advance directive? No - Patient declined No - Patient declined   No - Patient declined  Copy of [place] Chart? No - copy requested  No - copy requested  No - copy requested  Would patient like information on creating a medical advance directive? No - Patient declined        Current Medications (verified) Outpatient Encounter Medications as of 05/07/2022  Medication Sig   albuterol (VENTOLIN HFA) 108 (90 Base) MCG/ACT inhaler Inhale 2 puffs into the lungs every 6 (six) hours as needed for up to 30 days for Wheezing or Shortness of Breath.   Calcium Carbonate-Vitamin D 600-400 MG-UNIT per chew tablet Chew 1 tablet by mouth 2 (two) times daily.   escitalopram (LEXAPRO) 10 MG tablet Take 1 tablet (10 mg total) by mouth daily.   fexofenadine (ALLEGRA ALLERGY) 180 MG tablet Take 1 tablet (180 mg total) by mouth daily   ibandronate (BONIVA) 150 MG tablet Take 1 tablet (150 mg total) by mouth every 30 (thirty) days. Take in the morning with a full glass of water, on Taylor empty stomach, and do not take anything else by mouth or lie down for the next 30 min.    ipratropium-albuterol (DUONEB) 0.5-2.5 (3) MG/3ML SOLN Take 3 mLs by nebulization every 6 (six) hours as needed.   meclizine (ANTIVERT) 25 MG tablet Take one tablet every 8 hours as needed for dizziness   Tiotropium Bromide-Olodaterol (STIOLTO RESPIMAT) 2.5-2.5 MCG/ACT AERS Inhale into the lungs daily.   traZODone (DESYREL) 50 MG tablet TAKE 1/2 TO 1 TABLET BY MOUTH EVERY NIGHT AT BEDTIME AS NEEDED FOR SLEEP   [DISCONTINUED] buPROPion (WELLBUTRIN XL) 300 MG 24 hr tablet Take 1 tablet (300 mg total) by mouth daily. (Patient not taking: Reported on 06/16/2019)   [DISCONTINUED] doxycycline (VIBRA-TABS) 100 MG tablet Take 1 tablet (100 mg total) by mouth 2 (two) times daily.   [DISCONTINUED] predniSONE (DELTASONE) 10 MG tablet Take 6 tablets by mouth daily for 1 day, then 5 tabs for 1 day, then 4 tabs for 1 day, then 3 tabs for 1 day then 2 tabs for 1 day, then 1 tab for 1 day   No facility-administered encounter medications on file as of 05/07/2022.    Allergies (verified) Fosamax [alendronate sodium]   History: Past Medical History:  Diagnosis Date   Acne    Anxiety    Basal cell carcinoma 2021   right forearm   COPD (chronic obstructive pulmonary disease) (HCC)    Goiter    Osteopenia  Pancreatic lesion 03/07/2021   Renal cyst    Skin cancer 02/2014   Squamous cell carcinoma in situ 2021   left shin   Thyroid disease    Tobacco use disorder    Past Surgical History:  Procedure Laterality Date   [place]   COLONOSCOPY  09/27/2015   Dr.Nandigam   EXCISION MASS UPPER EXTREMETIES     [facility]   HEMORRHOID SURGERY  1994   TUBAL LIGATION  1993   Family History  Problem Relation Age of Onset   Hypertension Mother        Afib, living   Dementia Mother    Heart disease Father        Afib, pacemaker, PVD   Hypertension Father    Diabetes Brother    Hypertension Brother    Diabetes Brother    Hypertension Brother    Colon cancer Neg Hx     Esophageal cancer Neg Hx    Rectal cancer Neg Hx    Stomach cancer Neg Hx    Colon polyps Neg Hx    Social History   Socioeconomic History   Marital status: Married    Spouse name: Not on file   Number of children: Not on file   Years of education: Not on file   Highest education level: Not on file  Occupational History   Not on file  Tobacco Use   Smoking status: Every Day    Packs/day: 0.50    Years: 40.00    Total pack years: 20.00    Types: Cigarettes    Last attempt to quit: 01/13/2016    Years since quitting: 6.3   Smokeless tobacco: Never  Vaping Use   Vaping Use: Never used  Substance and Sexual Activity   Alcohol use: Not Currently   Drug use: No   Sexual activity: Yes  Other Topics Concern   Not on file  Social History Narrative   Married   2 sons (one in college) one about to move out   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, boards horses, raises cows   Retired [employer]   Enjoys resting    Social Determinants of Radio broadcast assistant Strain: Low Risk  (05/01/2021)   Overall Financial Resource Strain (CARDIA)    Difficulty of Paying Living Expenses: Not hard at all  Food Insecurity: No Food Insecurity (05/01/2021)   Hunger Vital Sign    Worried About Running Out of Food in the Last Year: Never true    Ran Out of Food in the Last Year: Never true  Transportation Needs: No Transportation Needs (05/01/2021)   PRAPARE - Hydrologist (Medical): No    Lack of Transportation (Non-Medical): No  Physical Activity: Inactive (05/01/2021)   Exercise Vital Sign    Days of Exercise per Week: 0 days    Minutes of Exercise per Session: 0 min  Stress: Stress Concern Present (05/01/2021)   [place]    Feeling of Stress : To some extent  Social Connections: Moderately Isolated (05/01/2021)   Social Connection and Isolation Panel [NHANES]    Frequency of Communication with Friends and Family: More  than three times a week    Frequency of Social Gatherings with Friends and Family: More than three times a week    Attends Religious Services: Never    Marine scientist or Organizations: No  Attends Archivist Meetings: Never    Marital Status: Married    Tobacco Counseling Ready to quit: Not Answered Counseling given: Not Answered   Clinical Intake:  Pre-visit preparation completed: Yes  Pain : No/denies pain     Diabetes: No  How often do you need to have someone help you when you read instructions, pamphlets, or other written materials from your doctor or pharmacy?: 1 - Never  Diabetic? No  Interpreter Needed?: No  Information entered by :: Beatris Ship, [place]   Activities of Daily Living    05/07/2022    9:10 AM  In your present state of health, do you have any difficulty performing the following activities:  Hearing? 0  Vision? 0  Difficulty concentrating or making decisions? 0  Walking or climbing stairs? 1  Comment breathing makes it more difficult  Dressing or bathing? 0  Doing errands, shopping? 0  Preparing Food and eating ? N  Using the Toilet? N  In the past six months, have you accidently leaked urine? N  Do you have problems with loss of bowel control? N  Managing your Medications? N  Managing your Finances? N  Housekeeping or managing your Housekeeping? N    Patient Care Team: Debbrah Alar, NP as PCP - General (Internal Medicine) Clent Jacks, MD as Consulting Physician (Ophthalmology) Renda Rolls, Jennefer Bravo, MD as Referring Physician (Dermatology)  Indicate any recent Medical Services you may have received from other than [facility] providers in the past year (date may be approximate).     Assessment:   This is a routine wellness examination for [place].  Hearing/Vision screen No results found.  Dietary issues and exercise activities discussed: Current Exercise Habits: The patient does not participate in regular  exercise at present, Exercise limited by: None identified   Goals Addressed             This Visit's Progress    Patient Stated   On track    Maintain current health       Depression Screen    05/07/2022    9:08 AM 04/24/2022   10:53 AM 05/01/2021    9:03 AM 02/26/2021    9:45 AM 02/07/2020    8:33 AM 12/07/2018    8:56 AM 03/03/2018   10:46 AM  PHQ 2/9 Scores  PHQ - 2 Score 2 0 2 5 0 6 2  PHQ- 9 Score '8  5 13 '$ 0 7 7    Fall Risk    05/07/2022    9:08 AM 04/24/2022   10:53 AM 05/01/2021    9:03 AM 02/26/2021    8:55 AM 02/07/2020    8:32 AM  Fall Risk   Falls in the past year? 0 0 0 0 0  Number falls in past yr: 0 0 0 0 0  Injury with Fall? 0 0 0 0 0  Risk for fall due to : No Fall Risks No Fall Risks     Follow up Falls evaluation completed Falls evaluation completed Falls prevention discussed      FALL RISK PREVENTION PERTAINING TO THE HOME:  Any stairs in or around the home? Yes  If so, are there any without handrails? No  Home free of loose throw rugs in walkways, pet beds, electrical cords, etc? Yes  Adequate lighting in your home to reduce risk of falls? Yes   ASSISTIVE DEVICES UTILIZED TO PREVENT FALLS:  Life alert? No  Use of a cane, walker or w/c? No  Grab  bars in the bathroom? No  Shower chair or bench in shower? Yes  Elevated toilet seat or a handicapped toilet? Yes   TIMED UP AND GO:  Was the test performed? Yes .  Length of time to ambulate 10 feet: 8 sec.   Gait steady and fast without use of assistive device  Cognitive Function:        05/07/2022    9:13 AM 05/01/2021    9:09 AM  6CIT Screen  What Year? 0 points 0 points  What month? 0 points 0 points  What time? 0 points 0 points  Count back from 20 0 points 0 points  Months in reverse 0 points 0 points  Repeat phrase 0 points 0 points  Total Score 0 points 0 points    Immunizations Immunization History  Administered Date(s) Administered   Fluad Quad(high Dose 65+) 06/16/2019,  08/21/2020   Influenza-Unspecified 06/07/2014, 05/23/2015   PFIZER Comirnaty(Gray Top)Covid-19 Tri-Sucrose Vaccine 02/26/2021   PFIZER(Purple Top)SARS-COV-2 Vaccination 09/29/2019, 10/19/2019, 06/07/2020   Pfizer Covid-19 Vaccine Bivalent Booster 6yr & up 07/04/2021   Pneumococcal Conjugate-13 07/27/2014   Pneumococcal Polysaccharide-23 08/21/2020   Tdap 04/07/2014   Zoster Recombinat (Shingrix) 06/08/2018, 08/09/2018   Zoster, Live 08/22/2015    TDAP status: Up to date  Flu Vaccine status: Up to date  Pneumococcal vaccine status: Up to date  Covid-19 vaccine status: Information provided on how to obtain vaccines.   Qualifies for Shingles Vaccine? Yes   Zostavax completed Yes   Shingrix Completed?: Yes  Screening Tests Health Maintenance  Topic Date Due   COVID-19 Vaccine (6 - Pfizer risk series) 08/29/2021   MAMMOGRAM  11/28/2021   TETANUS/TDAP  04/10/2024   COLONOSCOPY (Pts 45-468yrInsurance coverage will need to be confirmed)  06/05/2031   Pneumonia Vaccine 6568Years old  Completed   DEXA SCAN  Completed   Hepatitis C Screening  Completed   Zoster Vaccines- Shingrix  Completed   HPV VACCINES  Aged Out   INFLUENZA VACCINE  Discontinued    Health Maintenance  Health Maintenance Due  Topic Date Due   COVID-19 Vaccine (6 - Pfizer risk series) 08/29/2021   MAMMOGRAM  11/28/2021    Colorectal cancer screening: Type of screening: Colonoscopy. Completed 05/15/21. Repeat every 10 years  Mammogram status: Ordered 05/07/22. Pt provided with contact info and advised to call to schedule appt.   Bone Density status: Completed 6.10.21. Results reflect: Bone density results: OSTEOPOROSIS. Repeat every 2 years.  Lung Cancer Screening: (Low Dose CT Chest recommended if Age 68-80ears, 30 pack-year currently smoking OR have quit w/in 15years.) does qualify. Pulmonologist orders and follows every year.  AnWorthy KeelerPA  Lung Cancer Screening Referral: N/a  Additional  Screening:  Hepatitis C Screening: does qualify; Completed 02/16/17  Vision Screening: Recommended annual ophthalmology exams for early detection of glaucoma and other disorders of the eye. Is the patient up to date with their annual eye exam?  Yes  Who is the provider or what is the name of the office in which the patient attends annual eye exams? Dr. HuLaban Emperorf pt is not established with a provider, would they like to be referred to a provider to establish care? No .   Dental Screening: Recommended annual dental exams for proper oral hygiene  Community Resource Referral / Chronic Care Management: CRR required this visit?  No   CCM required this visit?  No      Plan:     I have personally reviewed and  noted the following in the patient's chart:   Medical and social history Use of alcohol, tobacco or illicit drugs  Current medications and supplements including opioid prescriptions. Patient is not currently taking opioid prescriptions. Functional ability and status Nutritional status Physical activity Advanced directives List of other physicians Hospitalizations, surgeries, and ER visits in previous 12 months Vitals Screenings to include cognitive, depression, and falls Referrals and appointments  In addition, I have reviewed and discussed with patient certain preventive protocols, quality metrics, and best practice recommendations. A written personalized care plan for preventive services as well as general preventive health recommendations were provided to patient.     Beatris Ship, [place]   05/07/2022   Nurse Notes: None

## 2022-05-07 NOTE — Patient Instructions (Signed)
Taylor Atkins , Thank you for taking time to come for your Medicare Wellness Visit. I appreciate your ongoing commitment to your health goals. Please review the following plan we discussed and let me know if I can assist you in the future.   These are the goals we discussed:  Goals      Patient Stated     Maintain current health        This is a list of the screening recommended for you and due dates:  Health Maintenance  Topic Date Due   COVID-19 Vaccine (6 - Pfizer risk series) 08/29/2021   Mammogram  11/28/2021   Tetanus Vaccine  04/10/2024   Colon Cancer Screening  06/05/2031   Pneumonia Vaccine  Completed   DEXA scan (bone density measurement)  Completed   Hepatitis C Screening: USPSTF Recommendation to screen - Ages 44-79 yo.  Completed   Zoster (Shingles) Vaccine  Completed   HPV Vaccine  Aged Out   Flu Shot  Discontinued     Next appointment: Follow up in one year for your annual wellness visit    Preventive Care 65 Years and Older, Female Preventive care refers to lifestyle choices and visits with your health care provider that can promote health and wellness. What does preventive care include? A yearly physical exam. This is also called an annual well check. Dental exams once or twice a year. Routine eye exams. Ask your health care provider how often you should have your eyes checked. Personal lifestyle choices, including: Daily care of your teeth and gums. Regular physical activity. Eating a healthy diet. Avoiding tobacco and drug use. Limiting alcohol use. Practicing safe sex. Taking low-dose aspirin every day. Taking vitamin and mineral supplements as recommended by your health care provider. What happens during an annual well check? The services and screenings done by your health care provider during your annual well check will depend on your age, overall health, lifestyle risk factors, and family history of disease. Counseling  Your health care provider  may ask you questions about your: Alcohol use. Tobacco use. Drug use. Emotional well-being. Home and relationship well-being. Sexual activity. Eating habits. History of falls. Memory and ability to understand (cognition). Work and work Statistician. Reproductive health. Screening  You may have the following tests or measurements: Height, weight, and BMI. Blood pressure. Lipid and cholesterol levels. These may be checked every 5 years, or more frequently if you are over 30 years old. Skin check. Lung cancer screening. You may have this screening every year starting at age 74 if you have a 30-pack-year history of smoking and currently smoke or have quit within the past 15 years. Fecal occult blood test (FOBT) of the stool. You may have this test every year starting at age 34. Flexible sigmoidoscopy or colonoscopy. You may have a sigmoidoscopy every 5 years or a colonoscopy every 10 years starting at age 61. Hepatitis C blood test. Hepatitis B blood test. Sexually transmitted disease (STD) testing. Diabetes screening. This is done by checking your blood sugar (glucose) after you have not eaten for a while (fasting). You may have this done every 1-3 years. Bone density scan. This is done to screen for osteoporosis. You may have this done starting at age 92. Mammogram. This may be done every 1-2 years. Talk to your health care provider about how often you should have regular mammograms. Talk with your health care provider about your test results, treatment options, and if necessary, the need for more tests. Vaccines  Your health care provider may recommend certain vaccines, such as: Influenza vaccine. This is recommended every year. Tetanus, diphtheria, and acellular pertussis (Tdap, Td) vaccine. You may need a Td booster every 10 years. Zoster vaccine. You may need this after age 39. Pneumococcal 13-valent conjugate (PCV13) vaccine. One dose is recommended after age 25. Pneumococcal  polysaccharide (PPSV23) vaccine. One dose is recommended after age 42. Talk to your health care provider about which screenings and vaccines you need and how often you need them. This information is not intended to replace advice given to you by your health care provider. Make sure you discuss any questions you have with your health care provider. Document Released: 09/20/2015 Document Revised: 05/13/2016 Document Reviewed: 06/25/2015 Elsevier Interactive Patient Education  2017 [place] Prevention in the Home Falls can cause injuries. They can happen to people of all ages. There are many things you can do to make your home safe and to help prevent falls. What can I do on the outside of my home? Regularly fix the edges of walkways and driveways and fix any cracks. Remove anything that might make you trip as you walk through a door, such as a raised step or threshold. Trim any bushes or trees on the path to your home. Use bright outdoor lighting. Clear any walking paths of anything that might make someone trip, such as rocks or tools. Regularly check to see if handrails are loose or broken. Make sure that both sides of any steps have handrails. Any raised decks and porches should have guardrails on the edges. Have any leaves, snow, or ice cleared regularly. Use sand or salt on walking paths during winter. Clean up any spills in your garage right away. This includes oil or grease spills. What can I do in the bathroom? Use night lights. Install grab bars by the toilet and in the tub and shower. Do not use towel bars as grab bars. Use non-skid mats or decals in the tub or shower. If you need to sit down in the shower, use a plastic, non-slip stool. Keep the floor dry. Clean up any water that spills on the floor as soon as it happens. Remove soap buildup in the tub or shower regularly. Attach bath mats securely with double-sided non-slip rug tape. Do not have throw rugs and other  things on the floor that can make you trip. What can I do in the bedroom? Use night lights. Make sure that you have a light by your bed that is easy to reach. Do not use any sheets or blankets that are too big for your bed. They should not hang down onto the floor. Have a firm chair that has side arms. You can use this for support while you get dressed. Do not have throw rugs and other things on the floor that can make you trip. What can I do in the kitchen? Clean up any spills right away. Avoid walking on wet floors. Keep items that you use a lot in easy-to-reach places. If you need to reach something above you, use a strong step stool that has a grab bar. Keep electrical cords out of the way. Do not use floor polish or wax that makes floors slippery. If you must use wax, use non-skid floor wax. Do not have throw rugs and other things on the floor that can make you trip. What can I do with my stairs? Do not leave any items on the stairs. Make sure that there are  handrails on both sides of the stairs and use them. Fix handrails that are broken or loose. Make sure that handrails are as long as the stairways. Check any carpeting to make sure that it is firmly attached to the stairs. Fix any carpet that is loose or worn. Avoid having throw rugs at the top or bottom of the stairs. If you do have throw rugs, attach them to the floor with carpet tape. Make sure that you have a light switch at the top of the stairs and the bottom of the stairs. If you do not have them, ask someone to add them for you. What else can I do to help prevent falls? Wear shoes that: Do not have high heels. Have rubber bottoms. Are comfortable and fit you well. Are closed at the toe. Do not wear sandals. If you use a stepladder: Make sure that it is fully opened. Do not climb a closed stepladder. Make sure that both sides of the stepladder are locked into place. Ask someone to hold it for you, if possible. Clearly  mark and make sure that you can see: Any grab bars or handrails. First and last steps. Where the edge of each step is. Use tools that help you move around (mobility aids) if they are needed. These include: Canes. Walkers. Scooters. Crutches. Turn on the lights when you go into a dark area. Replace any light bulbs as soon as they burn out. Set up your furniture so you have a clear path. Avoid moving your furniture around. If any of your floors are uneven, fix them. If there are any pets around you, be aware of where they are. Review your medicines with your doctor. Some medicines can make you feel dizzy. This can increase your chance of falling. Ask your doctor what other things that you can do to help prevent falls. This information is not intended to replace advice given to you by your health care provider. Make sure you discuss any questions you have with your health care provider. Document Released: 06/20/2009 Document Revised: 01/30/2016 Document Reviewed: 09/28/2014 Elsevier Interactive Patient Education  2017 [employer].

## 2022-05-14 DIAGNOSIS — J9611 Chronic respiratory failure with hypoxia: Secondary | ICD-10-CM | POA: Diagnosis not present

## 2022-05-14 DIAGNOSIS — J449 Chronic obstructive pulmonary disease, unspecified: Secondary | ICD-10-CM | POA: Diagnosis not present

## 2022-05-19 ENCOUNTER — Other Ambulatory Visit: Payer: Self-pay | Admitting: Family

## 2022-05-19 ENCOUNTER — Ambulatory Visit (HOSPITAL_BASED_OUTPATIENT_CLINIC_OR_DEPARTMENT_OTHER)
Admission: RE | Admit: 2022-05-19 | Discharge: 2022-05-19 | Disposition: A | Discharge status: Home / Self Care | Payer: Medicare HMO | Source: Ambulatory Visit | Attending: Family | Admitting: Family

## 2022-05-19 ENCOUNTER — Encounter (HOSPITAL_BASED_OUTPATIENT_CLINIC_OR_DEPARTMENT_OTHER): Payer: Self-pay

## 2022-05-19 DIAGNOSIS — Z1231 Encounter for screening mammogram for malignant neoplasm of breast: Secondary | ICD-10-CM | POA: Insufficient documentation

## 2022-05-20 ENCOUNTER — Other Ambulatory Visit (HOSPITAL_BASED_OUTPATIENT_CLINIC_OR_DEPARTMENT_OTHER): Payer: Self-pay

## 2022-05-20 ENCOUNTER — Other Ambulatory Visit: Payer: Self-pay | Admitting: Family

## 2022-05-20 MED ORDER — ESCITALOPRAM OXALATE 10 MG PO TABS
10.0000 mg | ORAL_TABLET | Freq: Every day | ORAL | 0 refills | Status: DC
  Filled 2022-05-20: qty 30, 30d supply, fill #0

## 2022-05-25 ENCOUNTER — Other Ambulatory Visit (HOSPITAL_BASED_OUTPATIENT_CLINIC_OR_DEPARTMENT_OTHER): Payer: Self-pay

## 2022-05-25 MED ORDER — PREDNISONE 10 MG PO TABS
ORAL_TABLET | ORAL | 0 refills | Status: DC
  Filled 2022-05-25: qty 10, 5d supply, fill #0

## 2022-05-25 MED ORDER — AZITHROMYCIN 250 MG PO TABS
ORAL_TABLET | ORAL | 0 refills | Status: DC
  Filled 2022-05-25: qty 6, 5d supply, fill #0

## 2022-06-04 ENCOUNTER — Other Ambulatory Visit (HOSPITAL_BASED_OUTPATIENT_CLINIC_OR_DEPARTMENT_OTHER): Payer: Self-pay

## 2022-06-04 DIAGNOSIS — J9611 Chronic respiratory failure with hypoxia: Secondary | ICD-10-CM | POA: Diagnosis not present

## 2022-06-04 DIAGNOSIS — F17210 Nicotine dependence, cigarettes, uncomplicated: Secondary | ICD-10-CM | POA: Diagnosis not present

## 2022-06-04 DIAGNOSIS — J432 Centrilobular emphysema: Secondary | ICD-10-CM | POA: Diagnosis not present

## 2022-06-04 DIAGNOSIS — R69 Illness, unspecified: Secondary | ICD-10-CM | POA: Diagnosis not present

## 2022-06-04 MED ORDER — BUDESONIDE 0.5 MG/2ML IN SUSP
0.5000 mg | Freq: Two times a day (BID) | RESPIRATORY_TRACT | 2 refills | Status: DC
  Filled 2022-06-04: qty 120, 30d supply, fill #0

## 2022-06-06 DIAGNOSIS — J9611 Chronic respiratory failure with hypoxia: Secondary | ICD-10-CM | POA: Diagnosis not present

## 2022-06-13 DIAGNOSIS — J449 Chronic obstructive pulmonary disease, unspecified: Secondary | ICD-10-CM | POA: Diagnosis not present

## 2022-06-13 DIAGNOSIS — J9611 Chronic respiratory failure with hypoxia: Secondary | ICD-10-CM | POA: Diagnosis not present

## 2022-06-18 ENCOUNTER — Encounter: Payer: Self-pay | Admitting: Family

## 2022-06-23 ENCOUNTER — Other Ambulatory Visit (HOSPITAL_BASED_OUTPATIENT_CLINIC_OR_DEPARTMENT_OTHER): Payer: Self-pay

## 2022-06-23 ENCOUNTER — Telehealth: Payer: Self-pay | Admitting: Family

## 2022-06-23 ENCOUNTER — Ambulatory Visit (INDEPENDENT_AMBULATORY_CARE_PROVIDER_SITE_OTHER): Payer: Medicare HMO | Admitting: Family

## 2022-06-23 VITALS — BP 119/59 | HR 79 | Temp 98.2°F | Resp 16 | Wt 118.0 lb

## 2022-06-23 DIAGNOSIS — R918 Other nonspecific abnormal finding of lung field: Secondary | ICD-10-CM | POA: Diagnosis not present

## 2022-06-23 DIAGNOSIS — E0590 Thyrotoxicosis, unspecified without thyrotoxic crisis or storm: Secondary | ICD-10-CM

## 2022-06-23 DIAGNOSIS — G250 Essential tremor: Secondary | ICD-10-CM | POA: Diagnosis not present

## 2022-06-23 DIAGNOSIS — E042 Nontoxic multinodular goiter: Secondary | ICD-10-CM | POA: Diagnosis not present

## 2022-06-23 DIAGNOSIS — F418 Other specified anxiety disorders: Secondary | ICD-10-CM | POA: Diagnosis not present

## 2022-06-23 DIAGNOSIS — K869 Disease of pancreas, unspecified: Secondary | ICD-10-CM | POA: Diagnosis not present

## 2022-06-23 DIAGNOSIS — G4700 Insomnia, unspecified: Secondary | ICD-10-CM | POA: Diagnosis not present

## 2022-06-23 DIAGNOSIS — R69 Illness, unspecified: Secondary | ICD-10-CM | POA: Diagnosis not present

## 2022-06-23 DIAGNOSIS — J440 Chronic obstructive pulmonary disease with acute lower respiratory infection: Secondary | ICD-10-CM | POA: Diagnosis not present

## 2022-06-23 LAB — TSH: TSH: 0.28 u[IU]/mL — ABNORMAL LOW (ref 0.35–5.50)

## 2022-06-23 LAB — T3, FREE: T3, Free: 3.3 pg/mL (ref 2.3–4.2)

## 2022-06-23 LAB — T4, FREE: Free T4: 0.8 ng/dL (ref 0.60–1.60)

## 2022-06-23 MED ORDER — TRAZODONE HCL 50 MG PO TABS: 25.0000 mg | ORAL_TABLET | Freq: Every evening | ORAL | 1 refills | Status: DC | PRN

## 2022-06-23 MED ORDER — ESCITALOPRAM OXALATE 10 MG PO TABS: 10.0000 mg | ORAL_TABLET | Freq: Every day | ORAL | 1 refills | Status: DC

## 2022-06-23 NOTE — Assessment & Plan Note (Signed)
Had follow up 2022- stable/bening appearing no dedicated follow up recommended.

## 2022-06-23 NOTE — Assessment & Plan Note (Signed)
Mild - will monitor 

## 2022-06-23 NOTE — Telephone Encounter (Signed)
See mychart.  

## 2022-06-23 NOTE — Assessment & Plan Note (Signed)
Stable on Lexapro '10mg'$ .  Continue same.

## 2022-06-23 NOTE — Assessment & Plan Note (Signed)
She is following with [facility] pulmonology.  Reports that she has required several rounds of antibiotics/prednisone.  Unfortunately, she continues to smoke. She is maintained on stiolto, duonebs/albuterol MDI.

## 2022-06-23 NOTE — Assessment & Plan Note (Signed)
Repeat tfts

## 2022-06-23 NOTE — Assessment & Plan Note (Signed)
Fair control on trazodone. Continue same.

## 2022-06-23 NOTE — Progress Notes (Signed)
Subjective:   By signing my name below, I, Carylon Perches, attest that this documentation has been prepared under the direction and in the presence of Karie Chimera, NP 06/23/2022   Patient ID: Taylor Atkins, female    DOB: 17-Aug-1956, 66 y.o.   MRN: [id number]  Chief Complaint  Patient presents with   COPD    Here for follow up   [place] refill   Insomnia    Needs trazodone refill    HPI Patient is in today for an office visit  Tremor: She complains of frequent tremors. She drinks a couple of cups of coffee a day but does not believe that the coffee significantly impacts her tremors. She denies of these tremors affecting her ability to write.   Breathing: Her symptoms are being managed by her pulmonologist. She reports that she frequently uses her Albuterol.   Sleep: She is currently taking 0.5 - 1 tablets of 50 Mg of Trazadone. She reports that she falls asleep well with the medication. However, she wakes up about once or twice during the evenings. She states that it has been a long time since she was able to get a consistent night's rest.   Mood: She reports that her mood is okay. 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   Health Maintenance Due  Topic Date Due   COVID-19 Vaccine (6 - Pfizer risk series) 08/29/2021    Past Medical History:  Diagnosis Date   Acne    Anxiety    Basal cell carcinoma 2021   right forearm   COPD (chronic obstructive pulmonary disease) (HCC)    Goiter    Osteopenia    Pancreatic lesion 03/07/2021   Renal cyst    Skin cancer 02/2014   Squamous cell carcinoma in situ 2021   left shin   Thyroid disease    Tobacco use disorder     Past Surgical History:  Procedure Laterality Date   [place]   COLONOSCOPY   09/27/2015   Dr.Nandigam   EXCISION MASS UPPER EXTREMETIES     [facility]   HEMORRHOID SURGERY  1994   TUBAL LIGATION  1993    Family History  Problem Relation Age of Onset   Hypertension Mother        Afib, living   Dementia Mother    Heart disease Father        Afib, pacemaker, PVD   Hypertension Father    Diabetes Brother    Hypertension Brother    Diabetes Brother    Hypertension Brother    Colon cancer Neg Hx    Esophageal cancer Neg Hx    Rectal cancer Neg Hx    Stomach cancer Neg Hx    Colon polyps Neg Hx     Social History   Socioeconomic History   Marital status: Married    Spouse name: Not on file   Number of children: Not on file   Years of education: Not on file   Highest education level: Not on file  Occupational History   Not on file  Tobacco Use   Smoking status: Every Day    Packs/day: 0.50    Years: 40.00    Total pack years: 20.00    Types: Cigarettes  Last attempt to quit: 01/13/2016    Years since quitting: 6.4   Smokeless tobacco: Never  Vaping Use   Vaping Use: Never used  Substance and Sexual Activity   Alcohol use: Not Currently   Drug use: No   Sexual activity: Yes  Other Topics Concern   Not on file  Social History Narrative   Married   2 sons (one in college) one about to move out   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, boards horses, raises cows   Retired [employer]   Enjoys resting    Social Determinants of Radio broadcast assistant Strain: [place]  (05/01/2021)   Overall Financial Resource Strain (CARDIA)    Difficulty of Paying Living Expenses: Not hard at all  Food Insecurity: No [place] (05/01/2021)   Hunger Vital Sign    Worried About Running Out of Food in the Last Year: Never true    [place] in the Last Year: Never true  Transportation Needs: No Transportation Needs (05/01/2021)   PRAPARE - Hydrologist (Medical): No    Lack of Transportation (Non-Medical): No  Physical Activity: Inactive  (05/01/2021)   Exercise Vital Sign    Days of Exercise per Week: 0 days    Minutes of Exercise per Session: 0 min  Stress: Stress Concern Present (05/01/2021)   [place]    Feeling of Stress : To some extent  Social Connections: Moderately Isolated (05/01/2021)   Social Connection and Isolation Panel [NHANES]    Frequency of Communication with Friends and Family: More than three times a week    Frequency of Social Gatherings with Friends and Family: More than three times a week    Attends Religious Services: Never    Marine scientist or Organizations: No    Attends Archivist Meetings: Never    Marital Status: Married  Human resources officer Violence: Not At Risk (05/01/2021)   Humiliation, Afraid, Rape, and Kick questionnaire    Fear of Current or Ex-Partner: No    Emotionally Abused: No    Physically Abused: No    Sexually Abused: No    Outpatient Medications Prior to Visit  Medication Sig Dispense Refill   albuterol (VENTOLIN HFA) 108 (90 Base) MCG/ACT inhaler Inhale 2 puffs into the lungs every 6 (six) hours as needed for up to 30 days for Wheezing or Shortness of Breath. 18 g 5   azithromycin (ZITHROMAX) 250 MG tablet Take 2 tablets by mouth on the first day and then one tablet every day after. 6 tablet 0   budesonide (PULMICORT) 0.5 MG/2ML nebulizer solution Take 2 mLs (0.5 mg total) by nebulization 2 (two) times daily. 120 mL 2   Calcium Carbonate-Vitamin D 600-400 MG-UNIT per chew tablet Chew 1 tablet by mouth 2 (two) times daily.     ibandronate (BONIVA) 150 MG tablet Take 1 tablet (150 mg total) by mouth every 30 (thirty) days. Take in the morning with a full glass of water, on an empty stomach, and do not take anything else by mouth or lie down for the next 30 min. 3 tablet 4   ipratropium-albuterol (DUONEB) 0.5-2.5 (3) MG/3ML SOLN Take 3 mLs by nebulization every 6 (six) hours as needed. 360 mL 1    meclizine (ANTIVERT) 25 MG tablet Take one tablet every 8 hours as needed for dizziness 30 tablet 0   predniSONE (DELTASONE) 10 MG tablet Take 1 tablet (  10 mg total) by mouth 2 times daily for 5 days. 10 tablet 0   Tiotropium Bromide-Olodaterol (STIOLTO RESPIMAT) 2.5-2.5 MCG/ACT AERS Inhale into the lungs daily.     escitalopram (LEXAPRO) 10 MG tablet Take 1 tablet (10 mg total) by mouth daily. 30 tablet 0   traZODone (DESYREL) 50 MG tablet Take 0.5-1 tablets (25-50 mg total) by mouth at bedtime as needed for sleep. 30 tablet 0   fexofenadine (ALLEGRA ALLERGY) 180 MG tablet Take 1 tablet (180 mg total) by mouth daily 30 tablet 0   No facility-administered medications prior to visit.    Allergies  Allergen Reactions   Fosamax [Alendronate Sodium] Other (See Comments)    Extreme nausea    Review of Systems  Neurological:  Positive for tremors.       Objective:    Physical Exam Constitutional:      General: She is not in acute distress.    Appearance: Normal appearance. She is not ill-appearing.  HENT:     Head: Normocephalic and atraumatic.     Right Ear: External ear normal.     Left Ear: External ear normal.  Eyes:     Extraocular Movements: Extraocular movements intact.     Pupils: Pupils are equal, round, and reactive to light.  Cardiovascular:     Rate and Rhythm: Normal rate and regular rhythm.     Heart sounds: Normal heart sounds. No murmur heard.    No gallop.  Pulmonary:     Effort: Pulmonary effort is normal. No respiratory distress.     Breath sounds: Wheezing (Bilateral, Expiratory) present. No rales.  Skin:    General: Skin is warm and dry.  Neurological:     Mental Status: She is alert and oriented to person, place, and time.     Comments: Fine bilateral resting hand tremor  Psychiatric:        Mood and Affect: Mood normal.        Behavior: Behavior normal.        Judgment: Judgment normal.     BP (!) 119/59 (BP Location: Right Arm, Patient Position:  Sitting, Cuff Size: Small)   Pulse 79   Temp 98.2 F (36.8 C) (Oral)   Resp 16   Wt 118 lb (53.5 kg)   SpO2 96%   BMI 20.90 kg/m  Wt Readings from Last 3 Encounters:  06/23/22 118 lb (53.5 kg)  05/07/22 117 lb 3.2 oz (53.2 kg)  04/24/22 116 lb 9.6 oz (52.9 kg)       Assessment & Plan:   Problem List Items Addressed This Visit       Unprioritized   Pancreatic lesion    Had follow up MRI 7/23:  IMPRESSION: 1. Two stable subcentimeter cystic lesions in the pancreas as described. Recommend continued follow-up with MRI abdomen with contrast in 2 years. 2. Multiple renal cysts including a hemorrhagic cyst.    Plan repeat MRI in 2025      Multinodular goiter  see ultrasound  2014    Had follow up 2022- stable/bening appearing no dedicated follow up recommended.       Lung mass    Had CT done at [facility] 04/18/22:   IMPRESSION:  1. Lung-RADS 2, benign appearance or behavior. Continue annual  screening with low-dose chest CT without contrast in 12 months.  2. Aortic atherosclerosis.  3. Mild diffuse bronchial wall thickening with moderate  centrilobular and paraseptal emphysema; imaging findings suggestive  of underlying COPD.  Insomnia    Fair control on trazodone. Continue same.       Hyperthyroidism - Primary    Repeat tfts      Relevant Orders   TSH   T3, free   T4, free   Essential tremor    Mild- will monitor.       Depression with anxiety    Stable on Lexapro '10mg'$ .  Continue same.       Relevant Medications   traZODone (DESYREL) 50 MG tablet   escitalopram (LEXAPRO) 10 MG tablet   COPD (chronic obstructive pulmonary disease) ([place])    She is following with [facility] pulmonology.  Reports that she has required several rounds of antibiotics/prednisone.  Unfortunately, she continues to smoke. She is maintained on stiolto, duonebs/albuterol MDI.      Meds ordered this encounter  Medications   traZODone (DESYREL) 50 MG tablet     Sig: Take 0.5-1 tablets (25-50 mg total) by mouth at bedtime as needed for sleep.    Dispense:  90 tablet    Refill:  1    Requested drug refills are authorized, however, the patient needs further evaluation and/or laboratory testing before further refills are given. Ask her to make an appointment for this.   escitalopram (LEXAPRO) 10 MG tablet    Sig: Take 1 tablet (10 mg total) by mouth daily.    Dispense:  90 tablet    Refill:  1    I, Nance Pear, NP, personally preformed the services described in this documentation.  All medical record entries made by the scribe were at my direction and in my presence.  I have reviewed the chart and discharge instructions (if applicable) and agree that the record reflects my personal performance and is accurate and complete. 06/23/2022   I,Amber Collins,acting as a scribe for Nance Pear, NP.,have documented all relevant documentation on the behalf of Nance Pear, NP,as directed by  Nance Pear, NP while in the presence of Nance Pear, NP.    Nance Pear, NP

## 2022-06-23 NOTE — Assessment & Plan Note (Signed)
Had CT done at [facility] 04/18/22:   IMPRESSION:  1. Lung-RADS 2, benign appearance or behavior. Continue annual  screening with low-dose chest CT without contrast in 12 months.  2. Aortic atherosclerosis.  3. Mild diffuse bronchial wall thickening with moderate  centrilobular and paraseptal emphysema; imaging findings suggestive  of underlying COPD.

## 2022-06-23 NOTE — Assessment & Plan Note (Signed)
Had follow up MRI 7/23:  IMPRESSION: 1. Two stable subcentimeter cystic lesions in the pancreas as described. Recommend continued follow-up with MRI abdomen with contrast in 2 years. 2. Multiple renal cysts including a hemorrhagic cyst.   Plan repeat MRI in 2025

## 2022-07-06 ENCOUNTER — Other Ambulatory Visit (HOSPITAL_BASED_OUTPATIENT_CLINIC_OR_DEPARTMENT_OTHER): Payer: Self-pay

## 2022-07-06 DIAGNOSIS — J9611 Chronic respiratory failure with hypoxia: Secondary | ICD-10-CM | POA: Diagnosis not present

## 2022-07-06 MED ORDER — AZITHROMYCIN 250 MG PO TABS
ORAL_TABLET | ORAL | 0 refills | Status: DC
  Filled 2022-07-06: qty 6, 5d supply, fill #0

## 2022-07-06 MED ORDER — PREDNISONE 10 MG PO TABS
10.0000 mg | ORAL_TABLET | Freq: Two times a day (BID) | ORAL | 0 refills | Status: DC
  Filled 2022-07-06: qty 10, 5d supply, fill #0

## 2022-07-14 DIAGNOSIS — J449 Chronic obstructive pulmonary disease, unspecified: Secondary | ICD-10-CM | POA: Diagnosis not present

## 2022-07-14 DIAGNOSIS — J9611 Chronic respiratory failure with hypoxia: Secondary | ICD-10-CM | POA: Diagnosis not present

## 2022-08-06 DIAGNOSIS — J9611 Chronic respiratory failure with hypoxia: Secondary | ICD-10-CM | POA: Diagnosis not present

## 2022-08-11 ENCOUNTER — Other Ambulatory Visit (HOSPITAL_BASED_OUTPATIENT_CLINIC_OR_DEPARTMENT_OTHER): Payer: Self-pay

## 2022-08-11 MED ORDER — AZITHROMYCIN 250 MG PO TABS
ORAL_TABLET | ORAL | 2 refills | Status: DC
  Filled 2022-08-11: qty 12, 28d supply, fill #0

## 2022-08-11 MED ORDER — PREDNISONE 10 MG PO TABS
10.0000 mg | ORAL_TABLET | Freq: Two times a day (BID) | ORAL | 0 refills | Status: AC
  Filled 2022-08-11: qty 14, 7d supply, fill #0

## 2022-08-13 DIAGNOSIS — J9611 Chronic respiratory failure with hypoxia: Secondary | ICD-10-CM | POA: Diagnosis not present

## 2022-08-13 DIAGNOSIS — J449 Chronic obstructive pulmonary disease, unspecified: Secondary | ICD-10-CM | POA: Diagnosis not present

## 2022-09-02 DIAGNOSIS — H52209 Unspecified astigmatism, unspecified eye: Secondary | ICD-10-CM | POA: Diagnosis not present

## 2022-09-02 DIAGNOSIS — H2513 Age-related nuclear cataract, bilateral: Secondary | ICD-10-CM | POA: Diagnosis not present

## 2022-09-02 DIAGNOSIS — H5203 Hypermetropia, bilateral: Secondary | ICD-10-CM | POA: Diagnosis not present

## 2022-09-02 DIAGNOSIS — H524 Presbyopia: Secondary | ICD-10-CM | POA: Diagnosis not present

## 2022-09-02 DIAGNOSIS — D3131 Benign neoplasm of right choroid: Secondary | ICD-10-CM | POA: Diagnosis not present

## 2022-09-03 DIAGNOSIS — J9611 Chronic respiratory failure with hypoxia: Secondary | ICD-10-CM | POA: Diagnosis not present

## 2022-09-03 DIAGNOSIS — F17210 Nicotine dependence, cigarettes, uncomplicated: Secondary | ICD-10-CM | POA: Diagnosis not present

## 2022-09-03 DIAGNOSIS — J410 Simple chronic bronchitis: Secondary | ICD-10-CM | POA: Diagnosis not present

## 2022-09-03 DIAGNOSIS — J432 Centrilobular emphysema: Secondary | ICD-10-CM | POA: Diagnosis not present

## 2022-09-03 DIAGNOSIS — R69 Illness, unspecified: Secondary | ICD-10-CM | POA: Diagnosis not present

## 2022-09-05 DIAGNOSIS — J9611 Chronic respiratory failure with hypoxia: Secondary | ICD-10-CM | POA: Diagnosis not present

## 2022-09-12 DIAGNOSIS — R0902 Hypoxemia: Secondary | ICD-10-CM | POA: Diagnosis not present

## 2022-09-13 DIAGNOSIS — J449 Chronic obstructive pulmonary disease, unspecified: Secondary | ICD-10-CM | POA: Diagnosis not present

## 2022-09-13 DIAGNOSIS — J9611 Chronic respiratory failure with hypoxia: Secondary | ICD-10-CM | POA: Diagnosis not present

## 2022-09-14 ENCOUNTER — Other Ambulatory Visit (HOSPITAL_BASED_OUTPATIENT_CLINIC_OR_DEPARTMENT_OTHER): Payer: Self-pay

## 2022-09-14 MED ORDER — BEVESPI AEROSPHERE 9-4.8 MCG/ACT IN AERO
2.0000 | INHALATION_SPRAY | Freq: Two times a day (BID) | RESPIRATORY_TRACT | 11 refills | Status: DC
  Filled 2022-09-14: qty 10.7, 30d supply, fill #0

## 2022-09-18 ENCOUNTER — Other Ambulatory Visit (HOSPITAL_BASED_OUTPATIENT_CLINIC_OR_DEPARTMENT_OTHER): Payer: Self-pay

## 2022-10-06 DIAGNOSIS — J9611 Chronic respiratory failure with hypoxia: Secondary | ICD-10-CM | POA: Diagnosis not present

## 2022-10-14 DIAGNOSIS — J449 Chronic obstructive pulmonary disease, unspecified: Secondary | ICD-10-CM | POA: Diagnosis not present

## 2022-10-14 DIAGNOSIS — J9611 Chronic respiratory failure with hypoxia: Secondary | ICD-10-CM | POA: Diagnosis not present

## 2022-11-05 DIAGNOSIS — J9611 Chronic respiratory failure with hypoxia: Secondary | ICD-10-CM | POA: Diagnosis not present

## 2022-11-07 ENCOUNTER — Other Ambulatory Visit: Payer: Self-pay

## 2022-11-07 ENCOUNTER — Encounter (HOSPITAL_BASED_OUTPATIENT_CLINIC_OR_DEPARTMENT_OTHER): Payer: Self-pay

## 2022-11-07 ENCOUNTER — Emergency Department (HOSPITAL_BASED_OUTPATIENT_CLINIC_OR_DEPARTMENT_OTHER): Payer: Medicare HMO

## 2022-11-07 ENCOUNTER — Emergency Department (HOSPITAL_BASED_OUTPATIENT_CLINIC_OR_DEPARTMENT_OTHER)
Admission: EM | Admit: 2022-11-07 | Discharge: 2022-11-07 | Disposition: A | Discharge status: Home / Self Care | Payer: Medicare HMO | Attending: Emergency Medicine | Admitting: Emergency Medicine

## 2022-11-07 DIAGNOSIS — R059 Cough, unspecified: Secondary | ICD-10-CM | POA: Diagnosis not present

## 2022-11-07 DIAGNOSIS — R0600 Dyspnea, unspecified: Secondary | ICD-10-CM | POA: Diagnosis not present

## 2022-11-07 DIAGNOSIS — R0602 Shortness of breath: Secondary | ICD-10-CM | POA: Diagnosis present

## 2022-11-07 DIAGNOSIS — J441 Chronic obstructive pulmonary disease with (acute) exacerbation: Secondary | ICD-10-CM

## 2022-11-07 DIAGNOSIS — R69 Illness, unspecified: Secondary | ICD-10-CM | POA: Diagnosis not present

## 2022-11-07 DIAGNOSIS — Z20822 Contact with and (suspected) exposure to covid-19: Secondary | ICD-10-CM | POA: Insufficient documentation

## 2022-11-07 DIAGNOSIS — F17200 Nicotine dependence, unspecified, uncomplicated: Secondary | ICD-10-CM | POA: Insufficient documentation

## 2022-11-07 LAB — BASIC METABOLIC PANEL
Anion gap: 6 (ref 5–15)
BUN: 15 mg/dL (ref 8–23)
CO2: 27 mmol/L (ref 22–32)
Calcium: 8.8 mg/dL — ABNORMAL LOW (ref 8.9–10.3)
Chloride: 104 mmol/L (ref 98–111)
Creatinine, Ser: 0.78 mg/dL (ref 0.44–1.00)
GFR, Estimated: >60 mL/min (ref >60)
Glucose, Bld: 116 mg/dL — ABNORMAL HIGH (ref 70–99)
Potassium: 4.1 mmol/L (ref 3.5–5.1)
Sodium: 137 mmol/L (ref 135–145)

## 2022-11-07 LAB — CBC
HCT: 41.8 % (ref 36.0–46.0)
Hemoglobin: 13.9 g/dL (ref 12.0–15.0)
MCH: 29.6 pg (ref 26.0–34.0)
MCHC: 33.3 g/dL (ref 30.0–36.0)
MCV: 88.9 fL (ref 80.0–100.0)
Platelets: 231 10*3/uL (ref 150–400)
RBC: 4.7 MIL/uL (ref 3.87–5.11)
RDW: 12.1 % (ref 11.5–15.5)
WBC: 9.4 10*3/uL (ref 4.0–10.5)
nRBC: 0 % (ref 0.0–0.2)

## 2022-11-07 LAB — RESP PANEL BY RT-PCR (RSV, FLU A&B, COVID)  RVPGX2
Influenza A by PCR: NEGATIVE
Influenza B by PCR: NEGATIVE
Resp Syncytial Virus by PCR: NEGATIVE
SARS Coronavirus 2 by RT PCR: NEGATIVE

## 2022-11-07 MED ORDER — METHYLPREDNISOLONE SODIUM SUCC 125 MG IJ SOLR
125.0000 mg | Freq: Once | INTRAMUSCULAR | Status: AC
  Administered 2022-11-07: 125 mg via INTRAVENOUS
  Filled 2022-11-07: qty 2

## 2022-11-07 MED ORDER — IPRATROPIUM-ALBUTEROL 0.5-2.5 (3) MG/3ML IN SOLN
RESPIRATORY_TRACT | Status: AC
  Administered 2022-11-07: 3 mL via RESPIRATORY_TRACT
  Filled 2022-11-07: qty 3

## 2022-11-07 MED ORDER — ALBUTEROL SULFATE (2.5 MG/3ML) 0.083% IN NEBU
10.0000 mg | INHALATION_SOLUTION | Freq: Once | RESPIRATORY_TRACT | Status: AC
  Administered 2022-11-07: 10 mg via RESPIRATORY_TRACT
  Filled 2022-11-07: qty 12

## 2022-11-07 MED ORDER — ALBUTEROL (5 MG/ML) CONTINUOUS INHALATION SOLN
INHALATION_SOLUTION | RESPIRATORY_TRACT | Status: AC
  Filled 2022-11-07: qty 20

## 2022-11-07 MED ORDER — IPRATROPIUM-ALBUTEROL 0.5-2.5 (3) MG/3ML IN SOLN: 3.0000 mL | Freq: Once | RESPIRATORY_TRACT | Status: AC

## 2022-11-07 MED ORDER — LEVOFLOXACIN 750 MG PO TABS: 750.0000 mg | ORAL_TABLET | Freq: Every day | ORAL | 0 refills | Status: DC

## 2022-11-07 MED ORDER — PREDNISONE 10 MG PO TABS: 20.0000 mg | ORAL_TABLET | Freq: Every day | ORAL | 0 refills | Status: DC

## 2022-11-07 NOTE — Discharge Instructions (Signed)
Please wear your oxygen at 2 L/min all day for the next several days. Increase your albuterol to every 4 hours Take prednisone and Levaquin as prescribed Return immediately to the emergency department if you are having any worsening symptoms Please call your pulmonologist and to be rechecked early next week.  If you are unable to get into your pulmonologist, please see your primary care doctor Return here if you are worse at any time

## 2022-11-07 NOTE — ED Provider Notes (Signed)
EMERGENCY DEPARTMENT AT [facility] Provider Note   CSN: [id number] Arrival date & time: 11/07/22  1054     History  Chief Complaint  Patient presents with   Shortness of Breath   Chest Pain    Taylor Atkins is a 67 y.o. female.  HPI Level 5 caveat secondary to dyspnea 67 year old female history of tobacco abuse, COPD, presents today with respiratory distress.  She reports that she has had an cough for 2 days.  She has been having some productive cough but states that she swallows it.  She has had increased dyspnea today.  She had onset of left-sided chest pain yesterday that is sharp and worse with deep breathing and coughing.  She denies fever or chills or exposure to any known recent illness.  Patient is on oxygen at 2 L at night at home but is using it today due to her dyspnea.  She is followed by pulmonary at [facility] Medications Prior to Admission medications   Medication Sig Start Date End Date Taking? Authorizing Provider  levofloxacin (LEVAQUIN) 750 MG tablet Take 1 tablet (750 mg total) by mouth daily. 11/07/22  Yes Pattricia Boss, MD  predniSONE (DELTASONE) 10 MG tablet Take 2 tablets (20 mg total) by mouth daily. 11/07/22  Yes Pattricia Boss, MD  albuterol (VENTOLIN HFA) 108 (90 Base) MCG/ACT inhaler Inhale 2 puffs into the lungs every 6 (six) hours as needed for up to 30 days for Wheezing or Shortness of Breath. 04/30/21     azithromycin (ZITHROMAX) 250 MG tablet Take 1 tablet (250 mg total) by mouth Every Monday, Wednesday, Friday for 30 days. 08/11/22     budesonide (PULMICORT) 0.5 MG/2ML nebulizer solution Take 2 mLs (0.5 mg total) by nebulization 2 (two) times daily. 06/04/22     Calcium Carbonate-Vitamin D 600-400 MG-UNIT per chew tablet Chew 1 tablet by mouth 2 (two) times daily. 05/28/15   Debbrah Alar, NP  escitalopram (LEXAPRO) 10 MG tablet Take 1 tablet (10 mg total) by mouth daily. 06/23/22   Debbrah Alar, NP   fexofenadine [facility] ALLERGY) 180 MG tablet Take 1 tablet (180 mg total) by mouth daily 03/20/22 04/04/22  Eliezer Lofts, FNP  Glycopyrrolate-Formoterol (BEVESPI AEROSPHERE) 9-4.8 MCG/ACT AERO Inhale 2 puffs into the lungs in the morning and at bedtime. 09/14/22     ibandronate (BONIVA) 150 MG tablet Take 1 tablet (150 mg total) by mouth every 30 (thirty) days. Take in the morning with a full glass of water, on an empty stomach, and do not take anything else by mouth or lie down for the next 30 min. 03/03/18   Debbrah Alar, NP  ipratropium-albuterol (DUONEB) 0.5-2.5 (3) MG/3ML SOLN Take 3 mLs by nebulization every 6 (six) hours as needed. 04/20/22   Saguier, Percell Miller, PA-C  meclizine (ANTIVERT) 25 MG tablet Take one tablet every 8 hours as needed for dizziness 09/20/13   Schoenhoff, Altamese Cabal, MD  Tiotropium Bromide-Olodaterol (STIOLTO RESPIMAT) 2.5-2.5 MCG/ACT AERS Inhale into the lungs daily.    [provider]  traZODone (DESYREL) 50 MG tablet Take 0.5-1 tablets (25-50 mg total) by mouth at bedtime as needed for sleep. 06/23/22   Debbrah Alar, NP  buPROPion (WELLBUTRIN XL) 300 MG 24 hr tablet Take 1 tablet (300 mg total) by mouth daily. Patient not taking: Reported on 06/16/2019 03/27/19 10/14/19  Debbrah Alar, NP      Allergies    Fosamax [alendronate sodium]    Review of Systems  Review of Systems  Physical Exam Updated Vital Signs BP 112/65   Pulse 88   Temp 98.5 F (36.9 C) (Oral)   Resp 15   Ht 1.6 m ('5\' 3"'$ )   Wt 52.2 kg   SpO2 (!) 87%   BMI 20.37 kg/m  Physical Exam Vitals and nursing note reviewed.  Constitutional:      Appearance: She is well-developed. She is ill-appearing.  HENT:     Head: Normocephalic.     Mouth/Throat:     Mouth: Mucous membranes are moist.  Eyes:     Pupils: Pupils are equal, round, and reactive to light.  Cardiovascular:     Rate and Rhythm: Regular rhythm. Tachycardia present.  Pulmonary:     Effort: Pulmonary effort  is normal. Tachypnea present.     Breath sounds: Examination of the right-lower field reveals decreased breath sounds. Examination of the left-lower field reveals decreased breath sounds. Decreased breath sounds and wheezing present.  Musculoskeletal:        General: Normal range of motion.     Cervical back: Normal range of motion.  Skin:    General: Skin is warm and dry.     Capillary Refill: Capillary refill takes less than 2 seconds.  Neurological:     General: No focal deficit present.     Mental Status: She is alert.  Psychiatric:        Mood and Affect: Mood normal.     ED Results / Procedures / Treatments   Labs (all labs ordered are listed, but only abnormal results are displayed) Labs Reviewed  BASIC METABOLIC PANEL - Abnormal; Notable for the following components:      Result Value   Glucose, Bld 116 (*)    Calcium 8.8 (*)    All other components within normal limits  RESP PANEL BY RT-PCR (RSV, FLU A&B, COVID)  [id number]  CBC    EKG EKG Interpretation  Date/Time:  Saturday November 07 2022 11:20:01 EST Ventricular Rate:  85 PR Interval:  141 QRS Duration: 85 QT Interval:  378 QTC Calculation: 450 R Axis:   84 Text Interpretation: Sinus arrhythmia Ventricular premature complex Anteroseptal infarct, age indeterminate Confirmed by Pattricia Boss [phone]) on 11/07/2022 1:15:58 PM  Radiology DG Chest Port 1 View  Result Date: 11/07/2022 CLINICAL DATA:  Cough.  Dyspnea. EXAM: PORTABLE CHEST 1 VIEW COMPARISON:  April 20, 2022 FINDINGS: Hyperinflation of the lungs. Probable scarring in the medial right upper lobe, unchanged. The heart, hila, mediastinum, lungs, and pleura are otherwise unremarkable. IMPRESSION: 1. Hyperinflation of the lungs consistent with COPD. 2. Probable scarring in the medial right upper lobe, unchanged. Electronically Signed   By: Dorise Bullion III M.D.   On: 11/07/2022 11:35    Procedures Procedures    Medications Ordered in ED Medications   ipratropium-albuterol (DUONEB) 0.5-2.5 (3) MG/3ML nebulizer solution 3 mL (3 mLs Nebulization Given 11/07/22 1110)  albuterol (PROVENTIL) (2.5 MG/3ML) 0.083% nebulizer solution 10 mg (10 mg Nebulization Given 11/07/22 1141)  methylPREDNISolone sodium succinate (SOLU-MEDROL) 125 mg/2 mL injection 125 mg (125 mg Intravenous Given 11/07/22 1259)    ED Course/ Medical Decision Making/ A&P Clinical Course as of 11/07/22 1437  Sat Nov 07, 2022  1433 Chest x-Tullio Chausse reviewed interpreted consistent with COPD and no acute changes are noted radiologist interpretation concurs CBC within normal limits Female reviewed interpreted significant for mild hyperglycemia and mild hypocalcemia COVID flu or RSV swab obtained and interpreted and negative [DR]    Clinical Course User  Index [DR] Pattricia Boss, MD                             Medical Decision Making Amount and/or Complexity of Data Reviewed Labs: ordered. Radiology: ordered.  Risk Prescription drug management.   67 year old female with known COPD presents today with dyspnea.  Patient has wheezing on exam.  Patient is treated with albuterol and Solu-Medrol. Differential diagnosis includes but is not limited to exacerbation of COPD, infection secondary to viral syndromes, pneumonia, other etiologies of dyspnea are much less likely although present here in the setting of her COPD exasperation such as PE, heart failure, another acute illnesses. Patient has improved air movement.  Oxygen saturations in the 90s on 2 L which she is on at home.  However at home she normally uses this at night.  Trialed her off of it her oxygen saturations drop.  She reports baseline wheezing at this time.  Chest x-Delquan Poucher was obtained and shows no evidence of acute abnormalities.  However oxygen comes up again easily with her 2 L nasal cannula.  Labs are within normal limits COVID, flu, and RSV are negative.  Symptoms most consistent with exaggeration of patient's baseline COPD.   Will add Levaquin Patient wishes to be discharged and feels she can manage at home with her oxygen.  We discussed risk and benefits.  Plan course of prednisone and increase albuterol to every 4 hours. We discussed that she should return immediately if she has any worsening symptoms.  She will follow-up with her pulmonologist early next week.       Final Clinical Impression(s) / ED Diagnoses Final diagnoses:  COPD exacerbation ([place])    Rx / DC Orders ED Discharge Orders          Ordered    levofloxacin (LEVAQUIN) 750 MG tablet  Daily        11/07/22 1436    predniSONE (DELTASONE) 10 MG tablet  Daily        11/07/22 1436              Pattricia Boss, MD 11/07/22 1437

## 2022-11-07 NOTE — ED Notes (Addendum)
Incorrect pt charting

## 2022-11-07 NOTE — ED Triage Notes (Signed)
C/o chest pain and shortness of breath, pain worse with coughing.Took inhaler without relief. Hx of COPD

## 2022-11-12 DIAGNOSIS — J449 Chronic obstructive pulmonary disease, unspecified: Secondary | ICD-10-CM | POA: Diagnosis not present

## 2022-11-12 DIAGNOSIS — J9611 Chronic respiratory failure with hypoxia: Secondary | ICD-10-CM | POA: Diagnosis not present

## 2022-11-17 DIAGNOSIS — J449 Chronic obstructive pulmonary disease, unspecified: Secondary | ICD-10-CM | POA: Diagnosis not present

## 2022-11-17 DIAGNOSIS — J9611 Chronic respiratory failure with hypoxia: Secondary | ICD-10-CM | POA: Diagnosis not present

## 2022-11-17 DIAGNOSIS — R69 Illness, unspecified: Secondary | ICD-10-CM | POA: Diagnosis not present

## 2022-11-17 DIAGNOSIS — J432 Centrilobular emphysema: Secondary | ICD-10-CM | POA: Diagnosis not present

## 2022-12-02 ENCOUNTER — Other Ambulatory Visit (HOSPITAL_BASED_OUTPATIENT_CLINIC_OR_DEPARTMENT_OTHER): Payer: Self-pay

## 2022-12-02 MED ORDER — PREDNISONE 10 MG PO TABS
10.0000 mg | ORAL_TABLET | Freq: Two times a day (BID) | ORAL | 0 refills | Status: DC
  Filled 2022-12-02: qty 14, 7d supply, fill #0

## 2022-12-05 DIAGNOSIS — J9611 Chronic respiratory failure with hypoxia: Secondary | ICD-10-CM | POA: Diagnosis not present

## 2022-12-13 DIAGNOSIS — J449 Chronic obstructive pulmonary disease, unspecified: Secondary | ICD-10-CM | POA: Diagnosis not present

## 2022-12-13 DIAGNOSIS — J9611 Chronic respiratory failure with hypoxia: Secondary | ICD-10-CM | POA: Diagnosis not present

## 2022-12-22 ENCOUNTER — Other Ambulatory Visit: Payer: Self-pay | Admitting: Family

## 2022-12-23 ENCOUNTER — Ambulatory Visit (INDEPENDENT_AMBULATORY_CARE_PROVIDER_SITE_OTHER): Payer: Medicare HMO | Admitting: Family

## 2022-12-23 VITALS — BP 113/50 | HR 74 | Temp 98.4°F | Resp 16 | Wt 117.0 lb

## 2022-12-23 DIAGNOSIS — K869 Disease of pancreas, unspecified: Secondary | ICD-10-CM | POA: Diagnosis not present

## 2022-12-23 DIAGNOSIS — M810 Age-related osteoporosis without current pathological fracture: Secondary | ICD-10-CM

## 2022-12-23 DIAGNOSIS — J440 Chronic obstructive pulmonary disease with acute lower respiratory infection: Secondary | ICD-10-CM

## 2022-12-23 DIAGNOSIS — R918 Other nonspecific abnormal finding of lung field: Secondary | ICD-10-CM | POA: Diagnosis not present

## 2022-12-23 DIAGNOSIS — R109 Unspecified abdominal pain: Secondary | ICD-10-CM | POA: Diagnosis not present

## 2022-12-23 DIAGNOSIS — R350 Frequency of micturition: Secondary | ICD-10-CM | POA: Insufficient documentation

## 2022-12-23 DIAGNOSIS — G4700 Insomnia, unspecified: Secondary | ICD-10-CM | POA: Diagnosis not present

## 2022-12-23 DIAGNOSIS — E0590 Thyrotoxicosis, unspecified without thyrotoxic crisis or storm: Secondary | ICD-10-CM

## 2022-12-23 DIAGNOSIS — Z720 Tobacco use: Secondary | ICD-10-CM | POA: Diagnosis not present

## 2022-12-23 LAB — TSH: TSH: 0.28 u[IU]/mL — ABNORMAL LOW (ref 0.35–5.50)

## 2022-12-23 LAB — T4, FREE: Free T4: 0.96 ng/dL (ref 0.60–1.60)

## 2022-12-23 LAB — T3, FREE: T3, Free: 3 pg/mL (ref 2.3–4.2)

## 2022-12-23 MED ORDER — ESCITALOPRAM OXALATE 10 MG PO TABS: 10.0000 mg | ORAL_TABLET | Freq: Every day | ORAL | 1 refills | Status: DC

## 2022-12-23 MED ORDER — ALBUTEROL SULFATE HFA 108 (90 BASE) MCG/ACT IN AERS: 2.0000 | INHALATION_SPRAY | Freq: Four times a day (QID) | RESPIRATORY_TRACT | 4 refills | Status: AC | PRN

## 2022-12-23 MED ORDER — IBANDRONATE SODIUM 150 MG PO TABS: 150.0000 mg | ORAL_TABLET | ORAL | 4 refills | Status: AC

## 2022-12-23 MED ORDER — TRAZODONE HCL 50 MG PO TABS: 50.0000 mg | ORAL_TABLET | Freq: Every day | ORAL | 1 refills | Status: DC

## 2022-12-23 MED ORDER — VARENICLINE TARTRATE (STARTER) 0.5 MG X 11 & 1 MG X 42 PO TBPK: 1.0000 | ORAL_TABLET | Freq: Every day | ORAL | 0 refills | Status: DC

## 2022-12-23 MED ORDER — IBANDRONATE SODIUM 150 MG PO TABS: 150.0000 mg | ORAL_TABLET | ORAL | 4 refills | Status: DC

## 2022-12-23 NOTE — Assessment & Plan Note (Addendum)
Uncontrolled. Refer back to Dr. Ver[facility] for ongoing management. She has a tremor and some diarrhea.

## 2022-12-23 NOTE — Assessment & Plan Note (Signed)
She has cut down to 4-5 cigarettes/day. Would like to re-try chantix which has been helpful for her in the past.

## 2022-12-23 NOTE — Assessment & Plan Note (Signed)
Fairly chronic. Will obtain x-ray to rule out uti.  Discussed trial of OAB medication but she declines.

## 2022-12-23 NOTE — Assessment & Plan Note (Signed)
She has had CT and MR of the abdomen which did not show any concerning  upper abdominal findings except for pancreatic cysts. She reports a lot of gas.  I suspect that this may be a contributor. Notes that sitting up straight and tall can help the discomfort. Gave pt a copy of the low FODMAP diet to try.

## 2022-12-23 NOTE — Assessment & Plan Note (Signed)
MR abdomen 7/23 noted:     IMPRESSION: 1. Two stable subcentimeter cystic lesions in the pancreas as described. Recommend continued follow-up with MRI abdomen with contrast in 2 years. 2. Multiple renal cysts including a hemorrhagic cyst.

## 2022-12-23 NOTE — Assessment & Plan Note (Signed)
Repeat CT did not show lung mass 7/23.   IMPRESSION:  1. Lung-RADS 2, benign appearance or behavior. Continue annual  screening with low-dose chest CT without contrast in 12 months.  2. Aortic atherosclerosis.  3. Mild diffuse bronchial wall thickening with moderate  centrilobular and paraseptal emphysema; imaging findings suggestive  of underlying COPD.

## 2022-12-23 NOTE — Assessment & Plan Note (Signed)
At baseline. Referral requested for [facility].

## 2022-12-23 NOTE — Assessment & Plan Note (Signed)
She continues to find trazodone helpful.  Continue same.

## 2022-12-23 NOTE — Progress Notes (Signed)
Subjective:   By signing my name below, I, Barrett Shell, attest that this documentation has been prepared under the direction and in the presence of Sandford Craze, NP. 12/23/2022   Patient ID: Taylor Atkins, female    DOB: 08/17/1956, 67 y.o.   MRN: [id number]  Chief Complaint  Patient presents with   Insomnia    Follow up, still having trouble staying sleep   Hyperthyroidism    Here for follow up   Tremors    Complains of tremors on both hands   Urinary Frequency    HPI Patient is in today for a follow-up appointment.   Overactive thyroid: She complains of a tremor and intermittent diarrhea that could be caused by her overactive thyroid. She has a history of overactive thyroid for the past 3 years, but it has worsened overtime.   COPD: She uses bevespi 2x daily PO. She uses her Albuterol every 2-4 hours. She uses a nebulizer as needed. She visited the ED on 11/07/2022 with SOB and chest pain from her COPD. She is currently not having any COPD flare-ups.   Right rib pain: On the right side of her rib cage, she experiences discomfort when coughing and at times when eating that has been ongoing for the past few years.    Urination: She complains of urinating 10 times a day for the past few months. She denies having any burning or urgency when urinating. She is not interested in taking any medication to manage it at this time.  Bone Health: She takes 150 mg boniva every 30 days for her bone health.   Mood: Her mood is stable and she takes 10 mg Lexapro daily PO to manage it. She is still experiencing stress from taking care of her mother.   Social History: She smokes about 5 cigarettes daily. She is taking 300 mg Wellbutrin to help her quit smoking. She took Chantix in the past and found it to be the most helpful to her.   Past Medical History:  Diagnosis Date   Acne    Anxiety    Basal cell carcinoma 2021   right forearm   COPD (chronic obstructive pulmonary disease)  (HCC)    Goiter    Osteopenia    Pancreatic lesion 03/07/2021   Renal cyst    Skin cancer 02/2014   Squamous cell carcinoma in situ 2021   left shin   Thyroid disease    Tobacco use disorder     Past Surgical History:  Procedure Laterality Date   BUNIONECTOMY  1978   CESAREAN SECTION  1990, 1993   COLONOSCOPY  09/27/2015   Dr.Nandigam   EXCISION MASS UPPER EXTREMETIES     [facility]   HEMORRHOID SURGERY  1994   TUBAL LIGATION  1993    Family History  Problem Relation Age of Onset   Hypertension Mother        Afib, living   Dementia Mother    Heart disease Father        Afib, pacemaker, PVD   Hypertension Father    Diabetes Brother    Hypertension Brother    Diabetes Brother    Hypertension Brother    Colon cancer Neg Hx    Esophageal cancer Neg Hx    Rectal cancer Neg Hx    Stomach cancer Neg Hx    Colon polyps Neg Hx     Social History   Socioeconomic History   Marital status: Married    Spouse  name: Not on file   Number of children: Not on file   Years of education: Not on file   Highest education level: Not on file  Occupational History   Not on file  Tobacco Use   Smoking status: Every Day    Packs/day: 0.50    Years: 40.00    Additional pack years: 0.00    Total pack years: 20.00    Types: Cigarettes    Last attempt to quit: 01/13/2016    Years since quitting: 6.9   Smokeless tobacco: Never  Vaping Use   Vaping Use: Never used  Substance and Sexual Activity   Alcohol use: Not Currently   Drug use: No   Sexual activity: Yes  Other Topics Concern   Not on file  Social History Narrative   Married   2 sons (one in college) one about to move out   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, boards horses, raises cows   Retired [employer]   Enjoys resting    Social Determinants of Corporate investment banker Strain: Low Risk  (05/01/2021)   Overall Financial Resource Strain (CARDIA)    Difficulty of Paying Living Expenses: Not hard at all  Food Insecurity: No Food Insecurity  (05/01/2021)   Hunger Vital Sign    Worried About Running Out of Food in the Last Year: Never true    Ran Out of Food in the Last Year: Never true  Transportation Needs: No Transportation Needs (05/01/2021)   PRAPARE - Administrator, Civil Service (Medical): No    Lack of Transportation (Non-Medical): No  Physical Activity: Inactive (05/01/2021)   Exercise Vital Sign    Days of Exercise per Week: 0 days    Minutes of Exercise per Session: 0 min  Stress: Stress Concern Present (05/01/2021)   [employer] of Occupational Health - Occupational Stress Questionnaire    Feeling of Stress : To some extent  Social Connections: Moderately Isolated (05/01/2021)   Social Connection and Isolation Panel [NHANES]    Frequency of Communication with Friends and Family: More than three times a week    Frequency of Social Gatherings with Friends and Family: More than three times a week    Attends Religious Services: Never    Database administrator or Organizations: No    Attends Banker Meetings: Never    Marital Status: Married  Catering manager Violence: Not At Risk (05/01/2021)   Humiliation, Afraid, Rape, and Kick questionnaire    Fear of Current or Ex-Partner: No    Emotionally Abused: No    Physically Abused: No    Sexually Abused: No    Outpatient Medications Prior to Visit  Medication Sig Dispense Refill   Calcium Carbonate-Vitamin D 600-400 MG-UNIT per chew tablet Chew 1 tablet by mouth 2 (two) times daily.     Glycopyrrolate-Formoterol (BEVESPI AEROSPHERE) 9-4.8 MCG/ACT AERO Inhale 2 puffs into the lungs in the morning and at bedtime. 10.7 g 11   ipratropium-albuterol (DUONEB) 0.5-2.5 (3) MG/3ML SOLN Take 3 mLs by nebulization every 6 (six) hours as needed. 360 mL 1   albuterol (VENTOLIN HFA) 108 (90 Base) MCG/ACT inhaler Inhale 2 puffs into the lungs every 6 (six) hours as needed for up to 30 days for Wheezing or Shortness of Breath. 18 g 5   azithromycin  (ZITHROMAX) 250 MG tablet Take 1 tablet (250 mg total) by mouth Every Monday, Wednesday, Friday for 30 days. 12 tablet 2   budesonide (PULMICORT) 0.5 MG/2ML  nebulizer solution Take 2 mLs (0.5 mg total) by nebulization 2 (two) times daily. 120 mL 2   escitalopram (LEXAPRO) 10 MG tablet Take 1 tablet (10 mg total) by mouth daily. 90 tablet 1   ibandronate (BONIVA) 150 MG tablet Take 1 tablet (150 mg total) by mouth every 30 (thirty) days. Take in the morning with a full glass of water, on an empty stomach, and do not take anything else by mouth or lie down for the next 30 min. 3 tablet 4   meclizine (ANTIVERT) 25 MG tablet Take one tablet every 8 hours as needed for dizziness 30 tablet 0   traZODone (DESYREL) 50 MG tablet Take 50 mg by mouth at bedtime.     fexofenadine (ALLEGRA ALLERGY) 180 MG tablet Take 1 tablet (180 mg total) by mouth daily 30 tablet 0   levofloxacin (LEVAQUIN) 750 MG tablet Take 1 tablet (750 mg total) by mouth daily. 7 tablet 0   predniSONE (DELTASONE) 10 MG tablet Take 2 tablets (20 mg total) by mouth daily. 10 tablet 0   predniSONE (DELTASONE) 10 MG tablet Take 1 tablet (10 mg total) by mouth 2 (two) times daily. For 7 days 14 tablet 0   Tiotropium Bromide-Olodaterol (STIOLTO RESPIMAT) 2.5-2.5 MCG/ACT AERS Inhale into the lungs daily.     traZODone (DESYREL) 50 MG tablet TAKE 1/2 TO 1 TABLET EVERY NIGHT AT BEDTIME AS NEEDED FOR SLEEP, MUST HAVE FURTHER EVALUATION AND TESTING FOR FUTURE REFILLS 90 tablet 1   No facility-administered medications prior to visit.    Allergies  Allergen Reactions   Fosamax [Alendronate Sodium] Other (See Comments)    Extreme nausea    Review of Systems  Genitourinary:  Positive for frequency.  Neurological:  Positive for tremors.       Objective:    Physical Exam Constitutional:      General: She is not in acute distress.    Appearance: Normal appearance.  HENT:     Head: Normocephalic and atraumatic.     Right Ear: External ear  normal.     Left Ear: External ear normal.  Eyes:     Extraocular Movements: Extraocular movements intact.     Pupils: Pupils are equal, round, and reactive to light.  Neck:     Comments: Mild thyromegaly Cardiovascular:     Rate and Rhythm: Normal rate and regular rhythm.     Heart sounds: Normal heart sounds. No murmur heard.    No gallop.  Pulmonary:     Effort: Pulmonary effort is normal. No respiratory distress.     Breath sounds: Wheezing (bilateral expiratory) present. No rales.  Skin:    General: Skin is warm.  Neurological:     Mental Status: She is alert and oriented to person, place, and time.  Psychiatric:        Judgment: Judgment normal.     BP (!) 113/50 (BP Location: Right Arm, Patient Position: Sitting, Cuff Size: Small)   Pulse 74   Temp 98.4 F (36.9 C) (Oral)   Resp 16   Wt 117 lb (53.1 kg)   SpO2 98%   BMI 20.73 kg/m  Wt Readings from Last 3 Encounters:  12/23/22 117 lb (53.1 kg)  11/07/22 115 lb (52.2 kg)  06/23/22 118 lb (53.5 kg)       Assessment & Plan:  Hyperthyroidism Assessment & Plan: Uncontrolled. Refer back to Dr. Ver[facility] for ongoing management. She has a tremor and some diarrhea.   Orders: -  Ambulatory referral to Endocrinology -     TSH -     T3, free -     T4, free  Osteoporosis, unspecified osteoporosis type, unspecified pathological fracture presence -     DG Bone Density; Future  Urinary frequency Assessment & Plan: Fairly chronic. Will obtain x-ray to rule out uti.  Discussed trial of OAB medication but she declines.   Orders: -     Urine Culture  Chronic obstructive pulmonary disease with acute lower respiratory infection Assessment & Plan: At baseline. Referral requested for [facility].   Orders: -     Ambulatory referral to Pulmonology  Pancreatic lesion Assessment & Plan: MR abdomen 7/23 noted:     IMPRESSION: 1. Two stable subcentimeter cystic lesions in the pancreas  as described. Recommend continued follow-up with MRI abdomen with contrast in 2 years. 2. Multiple renal cysts including a hemorrhagic cyst.   Tobacco abuse Assessment & Plan: She has cut down to 4-5 cigarettes/day. Would like to re-try chantix which has been helpful for her in the past.    Lung mass Assessment & Plan: Repeat CT did not show lung mass 7/23.   IMPRESSION:  1. Lung-RADS 2, benign appearance or behavior. Continue annual  screening with low-dose chest CT without contrast in 12 months.  2. Aortic atherosclerosis.  3. Mild diffuse bronchial wall thickening with moderate  centrilobular and paraseptal emphysema; imaging findings suggestive  of underlying COPD.    Insomnia, unspecified type Assessment & Plan: She continues to find trazodone helpful.  Continue same.    Abdominal cramping Assessment & Plan: She has had CT and MR of the abdomen which did not show any concerning  upper abdominal findings except for pancreatic cysts. She reports a lot of gas.  I suspect that this may be a contributor. Notes that sitting up straight and tall can help the discomfort. Gave pt a copy of the low FODMAP diet to try.    Other orders -     Varenicline Tartrate (Starter); Take 1 tablet by mouth daily.  Dispense: 53 each; Refill: 0 -     traZODone HCl; Take 1 tablet (50 mg total) by mouth at bedtime.  Dispense: 90 tablet; Refill: 1 -     Escitalopram Oxalate; Take 1 tablet (10 mg total) by mouth daily.  Dispense: 90 tablet; Refill: 1 -     Albuterol Sulfate HFA; Inhale 2 puffs into the lungs every 6 (six) hours as needed for wheezing or shortness of breath.  Dispense: 8 g; Refill: 4 -     Ibandronate Sodium; Take 1 tablet (150 mg total) by mouth every 30 (thirty) days. Take in the morning with a full glass of water, on an empty stomach, and do not take anything else by mouth or lie down for the next 30 min.  Dispense: 3 tablet; Refill: 4    I, Lemont Fillers, NP, personally  preformed the services described in this documentation.  All medical record entries made by the scribe were at my direction and in my presence.  I have reviewed the chart and discharge instructions (if applicable) and agree that the record reflects my personal performance and is accurate and complete. 12/23/2022  Lemont Fillers, NP  Mercer Pod as a scribe for Lemont Fillers, NP.,have documented all relevant documentation on the behalf of Lemont Fillers, NP,as directed by  Lemont Fillers, NP while in the presence of Lemont Fillers, NP.

## 2022-12-24 ENCOUNTER — Encounter: Payer: Self-pay | Admitting: Family

## 2022-12-24 LAB — URINE CULTURE
MICRO NUMBER:: 14837033
Result:: NO GROWTH
SPECIMEN QUALITY:: ADEQUATE

## 2022-12-28 ENCOUNTER — Other Ambulatory Visit (HOSPITAL_BASED_OUTPATIENT_CLINIC_OR_DEPARTMENT_OTHER): Payer: Self-pay

## 2022-12-28 MED ORDER — PREDNISONE 10 MG PO TABS
10.0000 mg | ORAL_TABLET | Freq: Two times a day (BID) | ORAL | 0 refills | Status: DC
  Filled 2022-12-28: qty 10, 5d supply, fill #0

## 2022-12-28 MED ORDER — ALBUTEROL SULFATE HFA 108 (90 BASE) MCG/ACT IN AERS
2.0000 | INHALATION_SPRAY | Freq: Four times a day (QID) | RESPIRATORY_TRACT | 5 refills | Status: DC | PRN
  Filled 2022-12-28: qty 6.7, 21d supply, fill #0

## 2022-12-31 DIAGNOSIS — L570 Actinic keratosis: Secondary | ICD-10-CM | POA: Diagnosis not present

## 2022-12-31 DIAGNOSIS — L814 Other melanin hyperpigmentation: Secondary | ICD-10-CM | POA: Diagnosis not present

## 2022-12-31 DIAGNOSIS — L300 Nummular dermatitis: Secondary | ICD-10-CM | POA: Diagnosis not present

## 2022-12-31 DIAGNOSIS — L578 Other skin changes due to chronic exposure to nonionizing radiation: Secondary | ICD-10-CM | POA: Diagnosis not present

## 2022-12-31 DIAGNOSIS — L821 Other seborrheic keratosis: Secondary | ICD-10-CM | POA: Diagnosis not present

## 2023-01-05 DIAGNOSIS — J9611 Chronic respiratory failure with hypoxia: Secondary | ICD-10-CM | POA: Diagnosis not present

## 2023-01-11 ENCOUNTER — Telehealth: Payer: Self-pay | Admitting: Family

## 2023-01-11 ENCOUNTER — Ambulatory Visit (HOSPITAL_BASED_OUTPATIENT_CLINIC_OR_DEPARTMENT_OTHER)
Admission: RE | Admit: 2023-01-11 | Discharge: 2023-01-11 | Disposition: A | Discharge status: Home / Self Care | Payer: Medicare HMO | Source: Ambulatory Visit | Attending: Family | Admitting: Family

## 2023-01-11 DIAGNOSIS — M810 Age-related osteoporosis without current pathological fracture: Secondary | ICD-10-CM | POA: Diagnosis not present

## 2023-01-11 NOTE — Telephone Encounter (Signed)
See mychart.  

## 2023-01-12 DIAGNOSIS — J449 Chronic obstructive pulmonary disease, unspecified: Secondary | ICD-10-CM | POA: Diagnosis not present

## 2023-01-12 DIAGNOSIS — J9611 Chronic respiratory failure with hypoxia: Secondary | ICD-10-CM | POA: Diagnosis not present

## 2023-01-18 ENCOUNTER — Encounter: Payer: Self-pay | Admitting: Family

## 2023-01-19 ENCOUNTER — Other Ambulatory Visit (HOSPITAL_BASED_OUTPATIENT_CLINIC_OR_DEPARTMENT_OTHER): Payer: Self-pay

## 2023-01-19 MED ORDER — VARENICLINE TARTRATE 1 MG PO TABS
1.0000 mg | ORAL_TABLET | Freq: Two times a day (BID) | ORAL | 1 refills | Status: DC
  Filled 2023-01-19: qty 4, 2d supply, fill #0
  Filled 2023-01-19: qty 56, 28d supply, fill #0
  Filled 2023-02-14: qty 56, 28d supply, fill #1
  Filled 2023-03-12: qty 4, 2d supply, fill #2

## 2023-01-22 ENCOUNTER — Other Ambulatory Visit (HOSPITAL_BASED_OUTPATIENT_CLINIC_OR_DEPARTMENT_OTHER): Payer: Self-pay

## 2023-01-22 MED ORDER — PREDNISONE 10 MG PO TABS
ORAL_TABLET | ORAL | 0 refills | Status: AC
  Filled 2023-01-22: qty 30, 12d supply, fill #0

## 2023-02-04 DIAGNOSIS — J9611 Chronic respiratory failure with hypoxia: Secondary | ICD-10-CM | POA: Diagnosis not present

## 2023-02-12 DIAGNOSIS — J449 Chronic obstructive pulmonary disease, unspecified: Secondary | ICD-10-CM | POA: Diagnosis not present

## 2023-02-12 DIAGNOSIS — J9611 Chronic respiratory failure with hypoxia: Secondary | ICD-10-CM | POA: Diagnosis not present

## 2023-02-15 ENCOUNTER — Other Ambulatory Visit: Payer: Self-pay

## 2023-02-15 ENCOUNTER — Other Ambulatory Visit (HOSPITAL_BASED_OUTPATIENT_CLINIC_OR_DEPARTMENT_OTHER): Payer: Self-pay

## 2023-02-16 ENCOUNTER — Other Ambulatory Visit (HOSPITAL_BASED_OUTPATIENT_CLINIC_OR_DEPARTMENT_OTHER): Payer: Self-pay

## 2023-02-19 ENCOUNTER — Other Ambulatory Visit (HOSPITAL_BASED_OUTPATIENT_CLINIC_OR_DEPARTMENT_OTHER): Payer: Self-pay

## 2023-02-19 MED ORDER — PREDNISONE 10 MG PO TABS
10.0000 mg | ORAL_TABLET | Freq: Two times a day (BID) | ORAL | 0 refills | Status: DC
  Filled 2023-02-19: qty 14, 7d supply, fill #0

## 2023-02-19 MED ORDER — BREZTRI AEROSPHERE 160-9-4.8 MCG/ACT IN AERO
1.0000 | INHALATION_SPRAY | Freq: Two times a day (BID) | RESPIRATORY_TRACT | 11 refills | Status: AC
  Filled 2023-02-19: qty 10.7, 30d supply, fill #0

## 2023-02-19 MED ORDER — AZITHROMYCIN 250 MG PO TABS
250.0000 mg | ORAL_TABLET | Freq: Every day | ORAL | 11 refills | Status: DC
  Filled 2023-02-19: qty 12, 28d supply, fill #0

## 2023-02-22 ENCOUNTER — Other Ambulatory Visit (HOSPITAL_BASED_OUTPATIENT_CLINIC_OR_DEPARTMENT_OTHER): Payer: Self-pay

## 2023-02-25 ENCOUNTER — Other Ambulatory Visit (HOSPITAL_BASED_OUTPATIENT_CLINIC_OR_DEPARTMENT_OTHER): Payer: Self-pay

## 2023-03-05 DIAGNOSIS — J410 Simple chronic bronchitis: Secondary | ICD-10-CM | POA: Diagnosis not present

## 2023-03-05 DIAGNOSIS — F17210 Nicotine dependence, cigarettes, uncomplicated: Secondary | ICD-10-CM | POA: Diagnosis not present

## 2023-03-05 DIAGNOSIS — J9611 Chronic respiratory failure with hypoxia: Secondary | ICD-10-CM | POA: Diagnosis not present

## 2023-03-05 DIAGNOSIS — R911 Solitary pulmonary nodule: Secondary | ICD-10-CM | POA: Diagnosis not present

## 2023-03-05 DIAGNOSIS — J432 Centrilobular emphysema: Secondary | ICD-10-CM | POA: Diagnosis not present

## 2023-03-07 DIAGNOSIS — J9611 Chronic respiratory failure with hypoxia: Secondary | ICD-10-CM | POA: Diagnosis not present

## 2023-03-12 ENCOUNTER — Other Ambulatory Visit: Payer: Self-pay | Admitting: Family

## 2023-03-12 ENCOUNTER — Other Ambulatory Visit (HOSPITAL_BASED_OUTPATIENT_CLINIC_OR_DEPARTMENT_OTHER): Payer: Self-pay

## 2023-03-12 DIAGNOSIS — Z85828 Personal history of other malignant neoplasm of skin: Secondary | ICD-10-CM | POA: Diagnosis not present

## 2023-03-12 DIAGNOSIS — Z8249 Family history of ischemic heart disease and other diseases of the circulatory system: Secondary | ICD-10-CM | POA: Diagnosis not present

## 2023-03-12 DIAGNOSIS — M810 Age-related osteoporosis without current pathological fracture: Secondary | ICD-10-CM | POA: Diagnosis not present

## 2023-03-12 DIAGNOSIS — F419 Anxiety disorder, unspecified: Secondary | ICD-10-CM | POA: Diagnosis not present

## 2023-03-12 DIAGNOSIS — Z7951 Long term (current) use of inhaled steroids: Secondary | ICD-10-CM | POA: Diagnosis not present

## 2023-03-12 DIAGNOSIS — F320 Major depressive disorder, single episode, mild: Secondary | ICD-10-CM | POA: Diagnosis not present

## 2023-03-12 DIAGNOSIS — G4700 Insomnia, unspecified: Secondary | ICD-10-CM | POA: Diagnosis not present

## 2023-03-12 DIAGNOSIS — E0590 Thyrotoxicosis, unspecified without thyrotoxic crisis or storm: Secondary | ICD-10-CM | POA: Diagnosis not present

## 2023-03-12 DIAGNOSIS — J4489 Other specified chronic obstructive pulmonary disease: Secondary | ICD-10-CM | POA: Diagnosis not present

## 2023-03-12 DIAGNOSIS — G250 Essential tremor: Secondary | ICD-10-CM | POA: Diagnosis not present

## 2023-03-12 MED ORDER — VARENICLINE TARTRATE 1 MG PO TABS
1.0000 mg | ORAL_TABLET | Freq: Two times a day (BID) | ORAL | 1 refills | Status: DC
  Filled 2023-03-12: qty 60, 30d supply, fill #0

## 2023-03-14 DIAGNOSIS — J449 Chronic obstructive pulmonary disease, unspecified: Secondary | ICD-10-CM | POA: Diagnosis not present

## 2023-03-14 DIAGNOSIS — J9611 Chronic respiratory failure with hypoxia: Secondary | ICD-10-CM | POA: Diagnosis not present

## 2023-03-17 ENCOUNTER — Ambulatory Visit (INDEPENDENT_AMBULATORY_CARE_PROVIDER_SITE_OTHER): Payer: Medicare HMO | Admitting: Family

## 2023-03-17 ENCOUNTER — Other Ambulatory Visit (HOSPITAL_BASED_OUTPATIENT_CLINIC_OR_DEPARTMENT_OTHER): Payer: Self-pay

## 2023-03-17 VITALS — BP 110/58 | HR 80 | Temp 98.6°F | Resp 16 | Wt 118.0 lb

## 2023-03-17 DIAGNOSIS — R631 Polydipsia: Secondary | ICD-10-CM | POA: Diagnosis not present

## 2023-03-17 DIAGNOSIS — J441 Chronic obstructive pulmonary disease with (acute) exacerbation: Secondary | ICD-10-CM

## 2023-03-17 DIAGNOSIS — R3589 Other polyuria: Secondary | ICD-10-CM

## 2023-03-17 DIAGNOSIS — R233 Spontaneous ecchymoses: Secondary | ICD-10-CM | POA: Diagnosis not present

## 2023-03-17 LAB — CBC WITH DIFFERENTIAL/PLATELET
Basophils Absolute: 0 10*3/uL (ref 0.0–0.1)
Basophils Relative: 0.7 % (ref 0.0–3.0)
Eosinophils Absolute: 0.5 10*3/uL (ref 0.0–0.7)
Eosinophils Relative: 8.5 % — ABNORMAL HIGH (ref 0.0–5.0)
HCT: 41.1 % (ref 36.0–46.0)
Hemoglobin: 13.8 g/dL (ref 12.0–15.0)
Lymphocytes Relative: 29.5 % (ref 12.0–46.0)
Lymphs Abs: 1.9 10*3/uL (ref 0.7–4.0)
MCHC: 33.6 g/dL (ref 30.0–36.0)
MCV: 89.5 fl (ref 78.0–100.0)
Monocytes Absolute: 0.5 10*3/uL (ref 0.1–1.0)
Monocytes Relative: 7.8 % (ref 3.0–12.0)
Neutro Abs: 3.4 10*3/uL (ref 1.4–7.7)
Neutrophils Relative %: 53.5 % (ref 43.0–77.0)
Platelets: 225 10*3/uL (ref 150.0–400.0)
RBC: 4.59 Mil/uL (ref 3.87–5.11)
RDW: 13.4 % (ref 11.5–15.5)
WBC: 6.3 10*3/uL (ref 4.0–10.5)

## 2023-03-17 LAB — BASIC METABOLIC PANEL
BUN: 9 mg/dL (ref 6–23)
CO2: 30 mEq/L (ref 19–32)
Calcium: 9.4 mg/dL (ref 8.4–10.5)
Chloride: 102 mEq/L (ref 96–112)
Creatinine, Ser: 0.85 mg/dL (ref 0.40–1.20)
GFR: 69.95 mL/min (ref >60.00)
Glucose, Bld: 97 mg/dL (ref 70–99)
Potassium: 4.1 mEq/L (ref 3.5–5.1)
Sodium: 140 mEq/L (ref 135–145)

## 2023-03-17 LAB — HEMOGLOBIN A1C: Hgb A1c MFr Bld: 6.1 % (ref 4.6–6.5)

## 2023-03-17 MED ORDER — PREDNISONE 10 MG PO TABS
ORAL_TABLET | ORAL | 0 refills | Status: AC
  Filled 2023-03-17: qty 30, 12d supply, fill #0

## 2023-03-17 NOTE — Progress Notes (Signed)
Subjective:     Patient ID: Taylor Atkins, female    DOB: Sep 03, 1956, 67 y.o.   MRN: [id number]  Chief Complaint  Patient presents with   Polydipsia    Patient reports symptoms of "severe thirst for the past few months"   Urinary Frequency    Patient reports frequent urination "due to increase fluid intake"    Urinary Frequency  Associated symptoms include frequency.    Discussed the use of AI scribe software for clinical note transcription with the patient, who gave verbal consent to proceed.  History of Present Illness   The patient presents with excessive thirst and frequent urination. She attributes the frequent urination to her increased water intake. She reports drinking a lot of water throughout the day, with the only other beverage being one to one and a half cups of coffee. She was advised to seek medical attention by the provider who came to her house to do medicare assessment to follow up with PCP for further evaluation and to rule out DM. She does have a family history of diabetes.  The patient also reports fatigue, which she attributes to caring for her mother. She has noticed that almost every time she urinates, she also has a bowel movement. The stool is described as being 'in between' formed and loose, and she reports going every day.  The patient is currently on Chantix for smoking cessation and reports smoking a couple of cigarettes a day.    COPD-She also reports having a wheeze and is being followed by a pulmonologist. Her breathing is variable, with some days being better than others. She is currently taking Breztri and albuterol as needed.  The patient also reports easy bruising, with bruises appearing from minor contact. She denies taking any aspirin products or anything that would thin her blood.          Health Maintenance Due  Topic Date Due   Lung Cancer Screening  01/14/2021   COVID-19 Vaccine (6 - 2023-24 season) 05/08/2022   Medicare Annual  Wellness (AWV)  05/08/2023    Past Medical History:  Diagnosis Date   Acne    Anxiety    Basal cell carcinoma 2021   right forearm   COPD (chronic obstructive pulmonary disease) (HCC)    Goiter    Osteopenia    Pancreatic lesion 03/07/2021   Renal cyst    Skin cancer 02/2014   Squamous cell carcinoma in situ 2021   left shin   Thyroid disease    Tobacco use disorder     Past Surgical History:  Procedure Laterality Date   BUNIONECTOMY  1978   CESAREAN SECTION  1990, 1993   COLONOSCOPY  09/27/2015   Dr.Nandigam   EXCISION MASS UPPER EXTREMETIES     [facility]   HEMORRHOID SURGERY  1994   TUBAL LIGATION  1993    Family History  Problem Relation Age of Onset   Hypertension Mother        Afib, living   Dementia Mother    Heart disease Father        Afib, pacemaker, PVD   Hypertension Father    Diabetes Brother    Hypertension Brother    Diabetes Brother    Hypertension Brother    Colon cancer Neg Hx    Esophageal cancer Neg Hx    Rectal cancer Neg Hx    Stomach cancer Neg Hx    Colon polyps Neg Hx     Social History  Socioeconomic History   Marital status: Married    Spouse name: Not on file   Number of children: Not on file   Years of education: Not on file   Highest education level: Not on file  Occupational History   Not on file  Tobacco Use   Smoking status: Every Day    Packs/day: 0.50    Years: 40.00    Additional pack years: 0.00    Total pack years: 20.00    Types: Cigarettes    Last attempt to quit: 01/13/2016    Years since quitting: 7.1   Smokeless tobacco: Never  Vaping Use   Vaping Use: Never used  Substance and Sexual Activity   Alcohol use: Not Currently   Drug use: No   Sexual activity: Yes  Other Topics Concern   Not on file  Social History Narrative   Married   2 sons (one in college) one about to move out   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, boards horses, raises cows   Retired [employer]   Enjoys resting    Social Determinants of Manufacturing engineer Strain: Low Risk  (05/01/2021)   Overall Financial Resource Strain (CARDIA)    Difficulty of Paying Living Expenses: Not hard at all  Food Insecurity: No Food Insecurity (05/01/2021)   Hunger Vital Sign    Worried About Running Out of Food in the Last Year: Never true    Ran Out of Food in the Last Year: Never true  Transportation Needs: No Transportation Needs (05/01/2021)   PRAPARE - Administrator, Civil Service (Medical): No    Lack of Transportation (Non-Medical): No  Physical Activity: Inactive (05/01/2021)   Exercise Vital Sign    Days of Exercise per Week: 0 days    Minutes of Exercise per Session: 0 min  Stress: Stress Concern Present (05/01/2021)   [employer] of Occupational Health - Occupational Stress Questionnaire    Feeling of Stress : To some extent  Social Connections: Moderately Isolated (05/01/2021)   Social Connection and Isolation Panel [NHANES]    Frequency of Communication with Friends and Family: More than three times a week    Frequency of Social Gatherings with Friends and Family: More than three times a week    Attends Religious Services: Never    Database administrator or Organizations: No    Attends Banker Meetings: Never    Marital Status: Married  Catering manager Violence: Not At Risk (05/01/2021)   Humiliation, Afraid, Rape, and Kick questionnaire    Fear of Current or Ex-Partner: No    Emotionally Abused: No    Physically Abused: No    Sexually Abused: No    Outpatient Medications Prior to Visit  Medication Sig Dispense Refill   albuterol (PROAIR HFA) 108 (90 Base) MCG/ACT inhaler Inhale 2 puffs into the lungs every 6 (six) hours as needed for wheezing or shortness of breath. 8 g 4   azithromycin (ZITHROMAX) 250 MG tablet Take 1 tablet (250 mg total) by mouth every Monday, Wednesday, and Friday 12 tablet 11   Budeson-Glycopyrrol-Formoterol (BREZTRI AEROSPHERE) 160-9-4.8 MCG/ACT AERO Inhale 1 puff  into the lungs 2 (two) times daily. 10.7 g 11   Calcium Carbonate-Vitamin D 600-400 MG-UNIT per chew tablet Chew 1 tablet by mouth 2 (two) times daily.     escitalopram (LEXAPRO) 10 MG tablet Take 1 tablet (10 mg total) by mouth daily. 90 tablet 1   Glycopyrrolate-Formoterol (BEVESPI AEROSPHERE) 9-4.8 MCG/ACT  AERO Inhale 2 puffs into the lungs in the morning and at bedtime. 10.7 g 11   ibandronate (BONIVA) 150 MG tablet Take 1 tablet (150 mg total) by mouth every 30 (thirty) days. Take in the morning with a full glass of water, on an empty stomach, and do not take anything else by mouth or lie down for the next 30 min. 3 tablet 4   ipratropium-albuterol (DUONEB) 0.5-2.5 (3) MG/3ML SOLN Take 3 mLs by nebulization every 6 (six) hours as needed. 360 mL 1   traZODone (DESYREL) 50 MG tablet Take 1 tablet (50 mg total) by mouth at bedtime. 90 tablet 1   varenicline (CHANTIX CONTINUING MONTH PAK) 1 MG tablet Take 1 tablet (1 mg total) by mouth 2 (two) times daily. 60 tablet 1   fexofenadine (ALLEGRA ALLERGY) 180 MG tablet Take 1 tablet (180 mg total) by mouth daily 30 tablet 0   albuterol (VENTOLIN HFA) 108 (90 Base) MCG/ACT inhaler Inhale 2 puffs into the lungs every 6 (six) hours as needed for wheezing or shortness of breath. 6.7 g 5   predniSONE (DELTASONE) 10 MG tablet Take 1 tablet (10 mg total) by mouth 2 (two) times daily. For 7 days 14 tablet 0   No facility-administered medications prior to visit.    Allergies  Allergen Reactions   Fosamax [Alendronate Sodium] Other (See Comments)    Extreme nausea    Review of Systems  Genitourinary:  Positive for frequency.       Objective:    Physical Exam Constitutional:      General: She is not in acute distress.    Appearance: Normal appearance. She is well-developed.  HENT:     Head: Normocephalic and atraumatic.     Right Ear: External ear normal.     Left Ear: External ear normal.  Eyes:     General: No scleral icterus. Neck:      Thyroid: No thyromegaly.  Cardiovascular:     Rate and Rhythm: Normal rate and regular rhythm.     Heart sounds: Normal heart sounds. No murmur heard. Pulmonary:     Effort: Pulmonary effort is normal. No respiratory distress.     Breath sounds: Normal breath sounds. No wheezing.     Comments: Bilateral inspiratory and expiratory wheeze noted Musculoskeletal:     Cervical back: Neck supple.  Skin:    General: Skin is warm and dry.     Comments: Ecchymosis noted on bilateral shins  Neurological:     Mental Status: She is alert and oriented to person, place, and time.  Psychiatric:        Mood and Affect: Mood normal.        Behavior: Behavior normal.        Thought Content: Thought content normal.        Judgment: Judgment normal.      BP (!) 110/58 (BP Location: Right Arm, Patient Position: Sitting, Cuff Size: Small)   Pulse 80   Temp 98.6 F (37 C) (Oral)   Resp 16   Wt 118 lb (53.5 kg)   SpO2 95%   BMI 20.90 kg/m  Wt Readings from Last 3 Encounters:  03/17/23 118 lb (53.5 kg)  12/23/22 117 lb (53.1 kg)  11/07/22 115 lb (52.2 kg)       Assessment & Plan:   Problem List Items Addressed This Visit       Unprioritized   Polyuria    Check UA/Culture for further evaluation.  Relevant Orders   Urinalysis, Routine w reflex microscopic   Urine Culture   Polydipsia - Primary    Obtain glucose/A1C for further evaluation. It has been excessively hot recently which could be a contributing factor.       Relevant Orders   Basic Metabolic Panel (BMET)   HgB A1c   Easy bruising    Check CBC.  If normal, likely due to thin sensitive skin.  Reassurance provided.       Relevant Orders   CBC w/Diff   COPD exacerbation (HCC)    Uncontrolled. Will rx with prednisone taper. Continue Breztri and albuterol.       Relevant Medications   predniSONE (DELTASONE) 10 MG tablet    I have discontinued Jasmine Pang. Coble's predniSONE. I have also changed her predniSONE.  Additionally, I am having her maintain her Calcium Carbonate-Vitamin D, fexofenadine, ipratropium-albuterol, Bevespi Aerosphere, traZODone, escitalopram, albuterol, ibandronate, azithromycin, Breztri Aerosphere, and varenicline.  Meds ordered this encounter  Medications   predniSONE (DELTASONE) 10 MG tablet    Sig: Take 4 tablets (40 mg total) by mouth daily for 3 days, THEN 3 tablets (30 mg total) daily for 3 days, THEN 2 tablets (20 mg total) daily for 3 days, THEN 1 tablet (10 mg total) daily for 3 days.    Dispense:  30 tablet    Refill:  0    Order Specific Question:   Supervising Provider    Answer:   Danise Edge A [[id number]]

## 2023-03-17 NOTE — Addendum Note (Signed)
Addended by: Thelma Barge D on: 03/17/2023 11:51 AM   Modules accepted: Orders

## 2023-03-17 NOTE — Assessment & Plan Note (Signed)
Uncontrolled. Will rx with prednisone taper. Continue Breztri and albuterol.

## 2023-03-17 NOTE — Patient Instructions (Signed)
VISIT SUMMARY:  During your visit, we discussed your concerns about excessive thirst and urination, fatigue, wheezing, and easy bruising. We also discussed your current smoking habits and the medications you are taking for your breathing issues. We have made a plan to address each of these issues and have scheduled a follow-up appointment for September 2024.  YOUR PLAN:  -EXCESSIVE THIRST AND URINATION: You've been drinking a lot of water and urinating frequently, which could be due to the high water intake or a sign of diabetes, a condition that affects how your body uses blood sugar. We will order labs, including glucose and HbA1c tests, to check for diabetes. We will also collect a urine sample to check for glucose and signs of infection.  -CHRONIC OBSTRUCTIVE PULMONARY DISEASE (COPD): You've cut down to a few cigarettes a day and have been experiencing variable breathing difficulty. COPD is a chronic lung disease that makes it hard to breathe. We will continue your Chantix treatment for another 12 weeks to help you quit smoking. We will also prescribe a course of prednisone, a medication that reduces inflammation in the lungs, to manage your current breathing difficulties.  -EASY BRUISING: You've been bruising easily, which could be due to a number of causes. We will check your blood count in the labs to rule out any blood-related causes.  INSTRUCTIONS:  Please continue to take your Chantix and breathing medications as prescribed. Once the lab results are ready, we will discuss them during your follow-up appointment in September 2024. If your symptoms worsen or if you have any concerns before your next appointment, please contact the office.

## 2023-03-17 NOTE — Assessment & Plan Note (Signed)
Obtain glucose/A1C for further evaluation. It has been excessively hot recently which could be a contributing factor.

## 2023-03-17 NOTE — Assessment & Plan Note (Signed)
Check CBC.  If normal, likely due to thin sensitive skin.  Reassurance provided.

## 2023-03-17 NOTE — Assessment & Plan Note (Signed)
Check UA/Culture for further evaluation.   

## 2023-04-06 DIAGNOSIS — J9611 Chronic respiratory failure with hypoxia: Secondary | ICD-10-CM | POA: Diagnosis not present

## 2023-04-11 ENCOUNTER — Ambulatory Visit
Admission: RE | Admit: 2023-04-11 | Discharge: 2023-04-11 | Disposition: A | Discharge status: Home / Self Care | Payer: Medicare HMO | Source: Ambulatory Visit | Attending: Family Medicine | Admitting: Family Medicine

## 2023-04-11 VITALS — BP 120/72 | HR 85 | Temp 98.2°F | Resp 20

## 2023-04-11 DIAGNOSIS — S91311A Laceration without foreign body, right foot, initial encounter: Secondary | ICD-10-CM | POA: Diagnosis not present

## 2023-04-11 MED ORDER — LIDOCAINE-EPINEPHRINE-TETRACAINE (LET) TOPICAL GEL
3.0000 mL | Freq: Once | TOPICAL | Status: AC
  Administered 2023-04-11: 3 mL via TOPICAL

## 2023-04-11 NOTE — Discharge Instructions (Signed)
Keep dry Watch for infection Soak tapes off in a week Call for problems

## 2023-04-11 NOTE — ED Provider Notes (Signed)
[name]    CSN: [id number] Arrival date & time: 04/11/23  1121      History   Chief Complaint Chief Complaint  Patient presents with   Foot Injury    Top of left foot split open by foot of mother standing on my foot to lift off floor.her foot twisted upon getting up. - Entered by patient    HPI Taylor Atkins is a 67 y.o. female.   Patient has a skin tear on the top of her right foot It occurred last night She cleaned and wrapped it . Stopped bleeding with pressure Has a history of easy bruising and fragile skin Tetanus 2015. Declines a booster    Past Medical History:  Diagnosis Date   Acne    Anxiety    Basal cell carcinoma 2021   right forearm   COPD (chronic obstructive pulmonary disease) (HCC)    Goiter    Osteopenia    Pancreatic lesion 03/07/2021   Renal cyst    Skin cancer 02/2014   Squamous cell carcinoma in situ 2021   left shin   Thyroid disease    Tobacco use disorder     Patient Active Problem List   Diagnosis Date Noted   Polydipsia 03/17/2023   Polyuria 03/17/2023   Easy bruising 03/17/2023   COPD exacerbation (HCC) 03/17/2023   Urinary frequency 12/23/2022   Abdominal cramping 12/23/2022   Essential tremor 06/23/2022   Multiple renal cysts 03/07/2021   Pancreatic lesion 03/07/2021   History of colon polyps 02/26/2021   Fatigue 02/26/2021   Generalized abdominal pain 02/26/2021   Preventative health care 02/26/2021   Basal cell carcinoma 2021   Hyperthyroidism 02/22/2017   Memory problem 06/10/2016   History of skin cancer 05/06/2015   Insomnia 05/06/2015   Multinodular goiter  see ultrasound  2014 12/28/2013   Vertigo 09/20/2013   Renal calculus, left 08/23/2012   COPD (chronic obstructive pulmonary disease) (HCC) 08/22/2012   Hematuria 07/26/2012   Asymptomatic PVCs 07/26/2012   Depression with anxiety    Tobacco abuse    ALLERGIC REACTION 02/07/2011    Past Surgical History:  Procedure Laterality Date    BUNIONECTOMY  1978   CESAREAN SECTION  1990, 1993   COLONOSCOPY  09/27/2015   Dr.Nandigam   EXCISION MASS UPPER EXTREMETIES     [facility]   HEMORRHOID SURGERY  1994   TUBAL LIGATION  1993    OB History     Gravida  3   Para  2   Term      Preterm      AB  1   Living         SAB  1   IAB      Ectopic      Multiple      Live Births               Home Medications    Prior to Admission medications   Medication Sig Start Date End Date Taking? Authorizing Provider  albuterol (PROAIR HFA) 108 (90 Base) MCG/ACT inhaler Inhale 2 puffs into the lungs every 6 (six) hours as needed for wheezing or shortness of breath. 12/23/22   Sandford Craze, NP  Budeson-Glycopyrrol-Formoterol (BREZTRI AEROSPHERE) 160-9-4.8 MCG/ACT AERO Inhale 1 puff into the lungs 2 (two) times daily. 02/19/23     Calcium Carbonate-Vitamin D 600-400 MG-UNIT per chew tablet Chew 1 tablet by mouth 2 (two) times daily. 05/28/15   Sandford Craze, NP  escitalopram Judye Bos)  10 MG tablet Take 1 tablet (10 mg total) by mouth daily. 12/23/22   Sandford Craze, NP  Glycopyrrolate-Formoterol (BEVESPI AEROSPHERE) 9-4.8 MCG/ACT AERO Inhale 2 puffs into the lungs in the morning and at bedtime. 09/14/22     ibandronate (BONIVA) 150 MG tablet Take 1 tablet (150 mg total) by mouth every 30 (thirty) days. Take in the morning with a full glass of water, on an empty stomach, and do not take anything else by mouth or lie down for the next 30 min. 12/23/22   Sandford Craze, NP  ipratropium-albuterol (DUONEB) 0.5-2.5 (3) MG/3ML SOLN Take 3 mLs by nebulization every 6 (six) hours as needed. 04/20/22   Saguier, Ramon Dredge, PA-C  traZODone (DESYREL) 50 MG tablet Take 1 tablet (50 mg total) by mouth at bedtime. 12/23/22   Sandford Craze, NP  varenicline (CHANTIX CONTINUING MONTH PAK) 1 MG tablet Take 1 tablet (1 mg total) by mouth 2 (two) times daily. 03/12/23   Sandford Craze, NP  buPROPion (WELLBUTRIN XL) 300 MG 24  hr tablet Take 1 tablet (300 mg total) by mouth daily. Patient not taking: Reported on 06/16/2019 03/27/19 10/14/19  Sandford Craze, NP    Family History Family History  Problem Relation Age of Onset   Hypertension Mother        Afib, living   Dementia Mother    Heart disease Father        Afib, pacemaker, PVD   Hypertension Father    Diabetes Brother    Hypertension Brother    Diabetes Brother    Hypertension Brother    Colon cancer Neg Hx    Esophageal cancer Neg Hx    Rectal cancer Neg Hx    Stomach cancer Neg Hx    Colon polyps Neg Hx     Social History Social History   Tobacco Use   Smoking status: Every Day    Current packs/day: 0.00    Average packs/day: 0.5 packs/day for 40.0 years (20.0 ttl pk-yrs)    Types: Cigarettes    Start date: 01/13/1976    Last attempt to quit: 01/13/2016    Years since quitting: 7.2   Smokeless tobacco: Never  Vaping Use   Vaping status: Never Used  Substance Use Topics   Alcohol use: Not Currently   Drug use: No     Allergies   Fosamax [alendronate sodium]   Review of Systems Review of Systems  See HPI Physical Exam Triage Vital Signs ED Triage Vitals [04/11/23 1136]  Encounter Vitals Group     BP 120/72     Systolic BP Percentile      Diastolic BP Percentile      Pulse Rate 85     Resp 20     Temp 98.2 F (36.8 C)     Temp Source Oral     SpO2 95 %     Weight      Height      Head Circumference      Peak Flow      Pain Score 4     Pain Loc      Pain Education      Exclude from Growth Chart    No data found.  Updated Vital Signs BP 120/72 (BP Location: Right Arm)   Pulse 85   Temp 98.2 F (36.8 C) (Oral)   Resp 20   SpO2 95%     Physical Exam Constitutional:      General: She is not in acute distress.  Appearance: She is well-developed.  HENT:     Head: Normocephalic and atraumatic.  Eyes:     Conjunctiva/sclera: Conjunctivae normal.     Pupils: Pupils are equal, round, and reactive to  light.  Cardiovascular:     Rate and Rhythm: Normal rate.  Pulmonary:     Effort: Pulmonary effort is normal. No respiratory distress.  Abdominal:     General: There is no distension.     Palpations: Abdomen is soft.  Musculoskeletal:        General: Normal range of motion.     Cervical back: Normal range of motion.       Feet:  Skin:    General: Skin is warm and dry.     Findings: Bruising and lesion present.  Neurological:     Mental Status: She is alert.     Gait: Gait normal.      UC Treatments / Results  Labs (all labs ordered are listed, but only abnormal results are displayed) Labs Reviewed - No data to display  EKG   Radiology No results found.  Procedures Area soaked in hibiclens solution LET applied Skin flap re approximated Steri strips applied Dry dressing  Medications Ordered in UC Medications  lidocaine-EPINEPHrine-tetracaine (LET) topical gel (3 mLs Topical Given 04/11/23 1155)    Initial Impression / Assessment and Plan / UC Course  I have reviewed the triage vital signs and the nursing notes.  Pertinent labs & imaging results that were available during my care of the patient were reviewed by me and considered in my medical decision making (see chart for details).     Wound care discussed Final Clinical Impressions(s) / UC Diagnoses   Final diagnoses:  Laceration of dorsum of right foot     Discharge Instructions      Keep dry Watch for infection Soak tapes off in a week Call for problems     ED Prescriptions   None    PDMP not reviewed this encounter.   Eustace Moore, MD 04/11/23 [phone]

## 2023-04-11 NOTE — ED Triage Notes (Signed)
Pt states yesterday afternoon she was helping to lift her mother out of chair and her mom stepped on left foot and tore the skin off the top part of her foot. She states she cleaned the area with soap and water and applied neosporin and bandage but the area is still bleeding and painful today.

## 2023-04-14 ENCOUNTER — Ambulatory Visit
Admission: EM | Admit: 2023-04-14 | Discharge: 2023-04-14 | Disposition: A | Discharge status: Home / Self Care | Payer: Medicare HMO | Attending: Family Medicine | Admitting: Family Medicine

## 2023-04-14 ENCOUNTER — Other Ambulatory Visit: Payer: Self-pay

## 2023-04-14 DIAGNOSIS — T148XXA Other injury of unspecified body region, initial encounter: Secondary | ICD-10-CM | POA: Diagnosis not present

## 2023-04-14 DIAGNOSIS — L089 Local infection of the skin and subcutaneous tissue, unspecified: Secondary | ICD-10-CM | POA: Diagnosis not present

## 2023-04-14 DIAGNOSIS — J9611 Chronic respiratory failure with hypoxia: Secondary | ICD-10-CM | POA: Diagnosis not present

## 2023-04-14 DIAGNOSIS — J449 Chronic obstructive pulmonary disease, unspecified: Secondary | ICD-10-CM | POA: Diagnosis not present

## 2023-04-14 MED ORDER — CEFADROXIL 500 MG PO CAPS: 500.0000 mg | ORAL_CAPSULE | Freq: Two times a day (BID) | ORAL | 0 refills | Status: DC

## 2023-04-14 NOTE — ED Provider Notes (Signed)
[facility]    CSN: [id number] Arrival date & time: 04/14/23  1634      History   Chief Complaint No chief complaint on file.   HPI Taylor Atkins is a 67 y.o. female.   HPI  Seen 8/04 for large skin tear.  Over 12 hours old It was soaked in hibiclens solution and loosely closed with steri strips It has gotten more swollen and red over the last 24 hours and is more painful No fever or chills Past Medical History:  Diagnosis Date   Acne    Anxiety    Basal cell carcinoma 2021   right forearm   COPD (chronic obstructive pulmonary disease) (HCC)    Goiter    Osteopenia    Pancreatic lesion 03/07/2021   Renal cyst    Skin cancer 02/2014   Squamous cell carcinoma in situ 2021   left shin   Thyroid disease    Tobacco use disorder     Patient Active Problem List   Diagnosis Date Noted   Polydipsia 03/17/2023   Polyuria 03/17/2023   Easy bruising 03/17/2023   COPD exacerbation (HCC) 03/17/2023   Urinary frequency 12/23/2022   Abdominal cramping 12/23/2022   Essential tremor 06/23/2022   Multiple renal cysts 03/07/2021   Pancreatic lesion 03/07/2021   History of colon polyps 02/26/2021   Fatigue 02/26/2021   Generalized abdominal pain 02/26/2021   Preventative health care 02/26/2021   Basal cell carcinoma 2021   Hyperthyroidism 02/22/2017   Memory problem 06/10/2016   History of skin cancer 05/06/2015   Insomnia 05/06/2015   Multinodular goiter  see ultrasound  2014 12/28/2013   Vertigo 09/20/2013   Renal calculus, left 08/23/2012   COPD (chronic obstructive pulmonary disease) (HCC) 08/22/2012   Hematuria 07/26/2012   Asymptomatic PVCs 07/26/2012   Depression with anxiety    Tobacco abuse    ALLERGIC REACTION 02/07/2011    Past Surgical History:  Procedure Laterality Date   BUNIONECTOMY  1978   CESAREAN SECTION  1990, 1993   COLONOSCOPY  09/27/2015   Dr.Nandigam   EXCISION MASS UPPER EXTREMETIES     [facility]   HEMORRHOID SURGERY  1994    TUBAL LIGATION  1993    OB History     Gravida  3   Para  2   Term      Preterm      AB  1   Living         SAB  1   IAB      Ectopic      Multiple      Live Births               Home Medications    Prior to Admission medications   Medication Sig Start Date End Date Taking? Authorizing Provider  cefadroxil (DURICEF) 500 MG capsule Take 1 capsule (500 mg total) by mouth 2 (two) times daily. 04/14/23  Yes Eustace Moore, MD  albuterol [facility] HFA) [phone] Base) MCG/ACT inhaler Inhale 2 puffs into the lungs every 6 (six) hours as needed for wheezing or shortness of breath. 12/23/22   Sandford Craze, NP  Budeson-Glycopyrrol-Formoterol (BREZTRI AEROSPHERE) 160-9-4.8 MCG/ACT AERO Inhale 1 puff into the lungs 2 (two) times daily. 02/19/23     Calcium Carbonate-Vitamin D 600-400 MG-UNIT per chew tablet Chew 1 tablet by mouth 2 (two) times daily. 05/28/15   Sandford Craze, NP  escitalopram (LEXAPRO) 10 MG tablet Take 1 tablet (10 mg total)  by mouth daily. 12/23/22   Sandford Craze, NP  Glycopyrrolate-Formoterol (BEVESPI AEROSPHERE) 9-4.8 MCG/ACT AERO Inhale 2 puffs into the lungs in the morning and at bedtime. 09/14/22     ibandronate (BONIVA) 150 MG tablet Take 1 tablet (150 mg total) by mouth every 30 (thirty) days. Take in the morning with a full glass of water, on an empty stomach, and do not take anything else by mouth or lie down for the next 30 min. 12/23/22   Sandford Craze, NP  ipratropium-albuterol (DUONEB) 0.5-2.5 (3) MG/3ML SOLN Take 3 mLs by nebulization every 6 (six) hours as needed. 04/20/22   Saguier, Ramon Dredge, PA-C  traZODone (DESYREL) 50 MG tablet Take 1 tablet (50 mg total) by mouth at bedtime. 12/23/22   Sandford Craze, NP  varenicline (CHANTIX CONTINUING MONTH PAK) 1 MG tablet Take 1 tablet (1 mg total) by mouth 2 (two) times daily. 03/12/23   Sandford Craze, NP  buPROPion (WELLBUTRIN XL) 300 MG 24 hr tablet Take 1 tablet (300 mg  total) by mouth daily. Patient not taking: Reported on 06/16/2019 03/27/19 10/14/19  Sandford Craze, NP    Family History Family History  Problem Relation Age of Onset   Hypertension Mother        Afib, living   Dementia Mother    Heart disease Father        Afib, pacemaker, PVD   Hypertension Father    Diabetes Brother    Hypertension Brother    Diabetes Brother    Hypertension Brother    Colon cancer Neg Hx    Esophageal cancer Neg Hx    Rectal cancer Neg Hx    Stomach cancer Neg Hx    Colon polyps Neg Hx     Social History Social History   Tobacco Use   Smoking status: Every Day    Current packs/day: 0.00    Average packs/day: 0.5 packs/day for 40.0 years (20.0 ttl pk-yrs)    Types: Cigarettes    Start date: 01/13/1976    Last attempt to quit: 01/13/2016    Years since quitting: 7.2   Smokeless tobacco: Never  Vaping Use   Vaping status: Never Used  Substance Use Topics   Alcohol use: Not Currently   Drug use: No     Allergies   Fosamax [alendronate sodium]   Review of Systems Review of Systems  See HPI Physical Exam Triage Vital Signs ED Triage Vitals  Encounter Vitals Group     BP 04/14/23 1645 126/69     Systolic BP Percentile --      Diastolic BP Percentile --      Pulse Rate 04/14/23 1642 76     Resp 04/14/23 1642 16     Temp 04/14/23 1642 98.4 F (36.9 C)     Temp Source 04/14/23 1642 Oral     SpO2 04/14/23 1642 98 %     Weight --      Height --      Head Circumference --      Peak Flow --      Pain Score 04/14/23 1646 7     Pain Loc --      Pain Education --      Exclude from Growth Chart --    No data found.  Updated Vital Signs BP 126/69   Pulse 76   Temp 98.4 F (36.9 C) (Oral)   Resp 16   SpO2 98%       Physical Exam Constitutional:  General: She is not in acute distress.    Appearance: She is well-developed.  HENT:     Head: Normocephalic and atraumatic.  Eyes:     Conjunctiva/sclera: Conjunctivae normal.      Pupils: Pupils are equal, round, and reactive to light.  Cardiovascular:     Rate and Rhythm: Normal rate.  Pulmonary:     Effort: Pulmonary effort is normal. No respiratory distress.  Abdominal:     General: There is no distension.     Palpations: Abdomen is soft.  Musculoskeletal:        General: Normal range of motion.     Cervical back: Normal range of motion.  Skin:    General: Skin is warm and dry.     Comments: Steri strips in place Original telfa still covering It is removed There is swelling warmth and erythema up ankle to [facility]  Neurological:     Mental Status: She is alert.      UC Treatments / Results  Labs (all labs ordered are listed, but only abnormal results are displayed) Labs Reviewed - No data to display  EKG   Radiology No results found.  Procedures Procedures (including critical care time)  Medications Ordered in UC Medications - No data to display  Initial Impression / Assessment and Plan / UC Course  I have reviewed the triage vital signs and the nursing notes.  Pertinent labs & imaging results that were available during my care of the patient were reviewed by me and considered in my medical decision making (see chart for details).     Final Clinical Impressions(s) / UC Diagnoses   Final diagnoses:  Post-traumatic wound infection     Discharge Instructions      Limit walking as much as you can to elevate foot Take the antibiotic 2 x a day Take tylenol or ibuprofen for pain Call or return for problems   ED Prescriptions     Medication Sig Dispense Auth. Provider   cefadroxil (DURICEF) 500 MG capsule Take 1 capsule (500 mg total) by mouth 2 (two) times daily. 14 capsule Eustace Moore, MD      PDMP not reviewed this encounter.   Eustace Moore, MD 04/14/23 [phone]

## 2023-04-14 NOTE — ED Triage Notes (Addendum)
Saturday had injury to left foot where mother stood on foot, has skin tear with steri strips in place. C/o swelling.

## 2023-04-14 NOTE — Discharge Instructions (Signed)
Limit walking as much as you can to elevate foot Take the antibiotic 2 x a day Take tylenol or ibuprofen for pain Call or return for problems

## 2023-04-20 DIAGNOSIS — F17210 Nicotine dependence, cigarettes, uncomplicated: Secondary | ICD-10-CM | POA: Diagnosis not present

## 2023-04-20 DIAGNOSIS — Z87891 Personal history of nicotine dependence: Secondary | ICD-10-CM | POA: Diagnosis not present

## 2023-04-22 ENCOUNTER — Encounter (INDEPENDENT_AMBULATORY_CARE_PROVIDER_SITE_OTHER): Payer: Self-pay

## 2023-04-24 ENCOUNTER — Ambulatory Visit
Admission: RE | Admit: 2023-04-24 | Discharge: 2023-04-24 | Disposition: A | Discharge status: Home / Self Care | Payer: Medicare HMO | Source: Ambulatory Visit | Attending: Family Medicine | Admitting: Family Medicine

## 2023-04-24 VITALS — BP 146/89 | HR 79 | Temp 98.8°F | Resp 18 | Ht 62.0 in | Wt 115.0 lb

## 2023-04-24 DIAGNOSIS — T148XXA Other injury of unspecified body region, initial encounter: Secondary | ICD-10-CM

## 2023-04-24 DIAGNOSIS — L089 Local infection of the skin and subcutaneous tissue, unspecified: Secondary | ICD-10-CM | POA: Diagnosis not present

## 2023-04-24 MED ORDER — MUPIROCIN 2 % EX OINT: 1.0000 | TOPICAL_OINTMENT | Freq: Every day | CUTANEOUS | 0 refills | Status: AC

## 2023-04-24 MED ORDER — TRAMADOL HCL 50 MG PO TABS: 50.0000 mg | ORAL_TABLET | Freq: Four times a day (QID) | ORAL | 0 refills | Status: DC | PRN

## 2023-04-24 NOTE — ED Triage Notes (Signed)
Patient here for recheck on her left foot wound on top of foot.  The area has been covered, now it is "milky" looking.  Patient is leaving for vacation tomorrow.  Tylenol is not helping with the pain.

## 2023-04-24 NOTE — Discharge Instructions (Signed)
This is healing - slowly Continue dressing changes and antibiotic ointment Use mupirocin with dressing changes Call for problems - we can arrange a wound clonic

## 2023-05-07 DIAGNOSIS — J9611 Chronic respiratory failure with hypoxia: Secondary | ICD-10-CM | POA: Diagnosis not present

## 2023-05-09 ENCOUNTER — Ambulatory Visit
Admission: RE | Admit: 2023-05-09 | Discharge: 2023-05-09 | Disposition: A | Discharge status: Home / Self Care | Payer: Medicare HMO | Source: Ambulatory Visit | Attending: Family Medicine | Admitting: Family Medicine

## 2023-05-09 VITALS — BP 135/60 | HR 87 | Temp 98.9°F | Resp 18 | Ht 62.0 in | Wt 115.0 lb

## 2023-05-09 DIAGNOSIS — L089 Local infection of the skin and subcutaneous tissue, unspecified: Secondary | ICD-10-CM

## 2023-05-09 DIAGNOSIS — T148XXA Other injury of unspecified body region, initial encounter: Secondary | ICD-10-CM

## 2023-05-09 MED ORDER — SULFAMETHOXAZOLE-TRIMETHOPRIM 800-160 MG PO TABS: 1.0000 | ORAL_TABLET | Freq: Two times a day (BID) | ORAL | 0 refills | Status: AC

## 2023-05-09 MED ORDER — TRAMADOL HCL 50 MG PO TABS: 50.0000 mg | ORAL_TABLET | Freq: Three times a day (TID) | ORAL | 0 refills | Status: AC | PRN

## 2023-05-09 NOTE — ED Provider Notes (Signed)
[name]    CSN: [id number] Arrival date & time: 05/09/23  4098      History   Chief Complaint Chief Complaint  Patient presents with   Foot Injury    Recheck left foot ?infection - Entered by patient    HPI Taylor Atkins is a 67 y.o. female.   HPI Patient is here for wound check.  She sees wound clinic this week however her foot has been increasingly painful, swollen, and red.  No fever or malaise. Past Medical History:  Diagnosis Date   Acne    Anxiety    Basal cell carcinoma 2021   right forearm   COPD (chronic obstructive pulmonary disease) (HCC)    Goiter    Osteopenia    Pancreatic lesion 03/07/2021   Renal cyst    Skin cancer 02/2014   Squamous cell carcinoma in situ 2021   left shin   Thyroid disease    Tobacco use disorder     Patient Active Problem List   Diagnosis Date Noted   Polydipsia 03/17/2023   Polyuria 03/17/2023   Easy bruising 03/17/2023   COPD exacerbation (HCC) 03/17/2023   Urinary frequency 12/23/2022   Abdominal cramping 12/23/2022   Essential tremor 06/23/2022   Multiple renal cysts 03/07/2021   Pancreatic lesion 03/07/2021   History of colon polyps 02/26/2021   Fatigue 02/26/2021   Generalized abdominal pain 02/26/2021   Preventative health care 02/26/2021   Basal cell carcinoma 2021   Hyperthyroidism 02/22/2017   Memory problem 06/10/2016   History of skin cancer 05/06/2015   Insomnia 05/06/2015   Multinodular goiter  see ultrasound  2014 12/28/2013   Vertigo 09/20/2013   Renal calculus, left 08/23/2012   COPD (chronic obstructive pulmonary disease) (HCC) 08/22/2012   Hematuria 07/26/2012   Asymptomatic PVCs 07/26/2012   Depression with anxiety    Tobacco abuse    ALLERGIC REACTION 02/07/2011    Past Surgical History:  Procedure Laterality Date   BUNIONECTOMY  1978   CESAREAN SECTION  1990, 1993   COLONOSCOPY  09/27/2015   Dr.Nandigam   EXCISION MASS UPPER EXTREMETIES     [facility]   HEMORRHOID  SURGERY  1994   TUBAL LIGATION  1993    OB History     Gravida  3   Para  2   Term      Preterm      AB  1   Living         SAB  1   IAB      Ectopic      Multiple      Live Births               Home Medications    Prior to Admission medications   Medication Sig Start Date End Date Taking? Authorizing Provider  albuterol (PROAIR HFA) 108 (90 Base) MCG/ACT inhaler Inhale 2 puffs into the lungs every 6 (six) hours as needed for wheezing or shortness of breath. 12/23/22  Yes Sandford Craze, NP  Budeson-Glycopyrrol-Formoterol (BREZTRI AEROSPHERE) 160-9-4.8 MCG/ACT AERO Inhale 1 puff into the lungs 2 (two) times daily. 02/19/23  Yes   Calcium Carbonate-Vitamin D 600-400 MG-UNIT per chew tablet Chew 1 tablet by mouth 2 (two) times daily. 05/28/15  Yes Sandford Craze, NP  escitalopram (LEXAPRO) 10 MG tablet Take 1 tablet (10 mg total) by mouth daily. 12/23/22  Yes Sandford Craze, NP  Glycopyrrolate-Formoterol (BEVESPI AEROSPHERE) 9-4.8 MCG/ACT AERO Inhale 2 puffs into the lungs in the  morning and at bedtime. 09/14/22  Yes   ibandronate (BONIVA) 150 MG tablet Take 1 tablet (150 mg total) by mouth every 30 (thirty) days. Take in the morning with a full glass of water, on an empty stomach, and do not take anything else by mouth or lie down for the next 30 min. 12/23/22  Yes Sandford Craze, NP  ipratropium-albuterol (DUONEB) 0.5-2.5 (3) MG/3ML SOLN Take 3 mLs by nebulization every 6 (six) hours as needed. 04/20/22  Yes Saguier, Ramon Dredge, PA-C  mupirocin ointment (BACTROBAN) 2 % Apply 1 Application topically daily. 04/24/23  Yes Eustace Moore, MD  sulfamethoxazole-trimethoprim (BACTRIM DS) 800-160 MG tablet Take 1 tablet by mouth 2 (two) times daily for 7 days. 05/09/23 05/16/23 Yes Eustace Moore, MD  traMADol (ULTRAM) 50 MG tablet Take 1 tablet (50 mg total) by mouth every 6 (six) hours as needed. 04/24/23  Yes Eustace Moore, MD  traZODone (DESYREL) 50 MG  tablet Take 1 tablet (50 mg total) by mouth at bedtime. 12/23/22  Yes Sandford Craze, NP  varenicline (CHANTIX CONTINUING MONTH PAK) 1 MG tablet Take 1 tablet (1 mg total) by mouth 2 (two) times daily. 03/12/23  Yes Sandford Craze, NP  buPROPion (WELLBUTRIN XL) 300 MG 24 hr tablet Take 1 tablet (300 mg total) by mouth daily. Patient not taking: Reported on 06/16/2019 03/27/19 10/14/19  Sandford Craze, NP    Family History Family History  Problem Relation Age of Onset   Hypertension Mother        Afib, living   Dementia Mother    Heart disease Father        Afib, pacemaker, PVD   Hypertension Father    Diabetes Brother    Hypertension Brother    Diabetes Brother    Hypertension Brother    Colon cancer Neg Hx    Esophageal cancer Neg Hx    Rectal cancer Neg Hx    Stomach cancer Neg Hx    Colon polyps Neg Hx     Social History Social History   Tobacco Use   Smoking status: Former    Current packs/day: 0.00    Average packs/day: 0.5 packs/day for 40.0 years (20.0 ttl pk-yrs)    Types: Cigarettes    Start date: 01/13/1976    Quit date: 01/13/2016    Years since quitting: 7.3   Smokeless tobacco: Never  Vaping Use   Vaping status: Never Used  Substance Use Topics   Alcohol use: Not Currently   Drug use: No     Allergies   Fosamax [alendronate sodium]   Review of Systems Review of Systems  See HPI Physical Exam Triage Vital Signs ED Triage Vitals  Encounter Vitals Group     BP 05/09/23 0946 135/60     Systolic BP Percentile --      Diastolic BP Percentile --      Pulse Rate 05/09/23 0946 87     Resp 05/09/23 0946 18     Temp 05/09/23 0946 98.9 F (37.2 C)     Temp Source 05/09/23 0946 Oral     SpO2 05/09/23 0946 91 %     Weight 05/09/23 0948 115 lb (52.2 kg)     Height 05/09/23 0948 5\' 2"  (1.575 m)     Head Circumference --      Peak Flow --      Pain Score 05/09/23 0948 7     Pain Loc --      Pain Education --  Exclude from Growth Chart --     No data found.  Updated Vital Signs BP 135/60 (BP Location: Right Arm)   Pulse 87   Temp 98.9 F (37.2 C) (Oral)   Resp 18   Ht 5\' 2"  (1.575 m)   Wt 52.2 kg   SpO2 91%   BMI 21.03 kg/m       Physical Exam Constitutional:      General: She is not in acute distress.    Appearance: She is well-developed.  HENT:     Head: Normocephalic and atraumatic.  Eyes:     Conjunctiva/sclera: Conjunctivae normal.     Pupils: Pupils are equal, round, and reactive to light.  Cardiovascular:     Rate and Rhythm: Normal rate.  Pulmonary:     Effort: Pulmonary effort is normal. No respiratory distress.  Abdominal:     General: There is no distension.     Palpations: Abdomen is soft.  Musculoskeletal:        General: Normal range of motion.     Cervical back: Normal range of motion.       Feet:  Feet:     Comments: Entire dorsum of foot is swollen with a deep erythema.  To the central wound is not healing and appears to be deeper than previously thought. Skin:    General: Skin is warm and dry.  Neurological:     Mental Status: She is alert.      UC Treatments / Results  Labs (all labs ordered are listed, but only abnormal results are displayed) Labs Reviewed - No data to display  EKG   Radiology No results found.  Procedures Procedures (including critical care time)  Medications Ordered in UC Medications - No data to display  Initial Impression / Assessment and Plan / UC Course  I have reviewed the triage vital signs and the nursing notes.  Pertinent labs & imaging results that were available during my care of the patient were reviewed by me and considered in my medical decision making (see chart for details).     I am glad she is seeing the wound clinic.  We will clean and dress her wound today and start her on antibiotics for infection. Final Clinical Impressions(s) / UC Diagnoses   Final diagnoses:  Post-traumatic wound infection     Discharge  Instructions      Take antibiotic 2 times a day for 7 days See wound clinic on the fourth Call for problems     ED Prescriptions     Medication Sig Dispense Auth. Provider   sulfamethoxazole-trimethoprim (BACTRIM DS) 800-160 MG tablet Take 1 tablet by mouth 2 (two) times daily for 7 days. 14 tablet Eustace Moore, MD      PDMP not reviewed this encounter.   Eustace Moore, MD 05/09/23 1006

## 2023-05-09 NOTE — ED Triage Notes (Signed)
Patient here for a recheck of her left foot.  Patient is concerned that there's an infection possibly.  Some redness, swelling and pain to the foot.  Patient does have an appt on Wednesday 05/12/2023 w/wound care.

## 2023-05-09 NOTE — Discharge Instructions (Addendum)
Take antibiotic 2 times a day for 7 days See wound clinic on the fourth May take tramadol up to 2 pills three times a day if needed pain Call for problems

## 2023-05-11 DIAGNOSIS — Z79899 Other long term (current) drug therapy: Secondary | ICD-10-CM | POA: Diagnosis not present

## 2023-05-11 DIAGNOSIS — R911 Solitary pulmonary nodule: Secondary | ICD-10-CM | POA: Diagnosis not present

## 2023-05-11 DIAGNOSIS — I700 Atherosclerosis of aorta: Secondary | ICD-10-CM | POA: Diagnosis not present

## 2023-05-11 DIAGNOSIS — R918 Other nonspecific abnormal finding of lung field: Secondary | ICD-10-CM | POA: Diagnosis not present

## 2023-05-11 DIAGNOSIS — J439 Emphysema, unspecified: Secondary | ICD-10-CM | POA: Diagnosis not present

## 2023-05-12 DIAGNOSIS — T8130XD Disruption of wound, unspecified, subsequent encounter: Secondary | ICD-10-CM | POA: Diagnosis not present

## 2023-05-12 DIAGNOSIS — Y829 Unspecified medical devices associated with adverse incidents: Secondary | ICD-10-CM | POA: Diagnosis not present

## 2023-05-12 DIAGNOSIS — T8133XA Disruption of traumatic injury wound repair, initial encounter: Secondary | ICD-10-CM | POA: Diagnosis not present

## 2023-05-12 DIAGNOSIS — F17210 Nicotine dependence, cigarettes, uncomplicated: Secondary | ICD-10-CM | POA: Diagnosis not present

## 2023-05-12 DIAGNOSIS — L97522 Non-pressure chronic ulcer of other part of left foot with fat layer exposed: Secondary | ICD-10-CM | POA: Diagnosis not present

## 2023-05-13 DIAGNOSIS — S91302A Unspecified open wound, left foot, initial encounter: Secondary | ICD-10-CM | POA: Diagnosis not present

## 2023-05-15 DIAGNOSIS — J9611 Chronic respiratory failure with hypoxia: Secondary | ICD-10-CM | POA: Diagnosis not present

## 2023-05-15 DIAGNOSIS — J449 Chronic obstructive pulmonary disease, unspecified: Secondary | ICD-10-CM | POA: Diagnosis not present

## 2023-05-17 ENCOUNTER — Ambulatory Visit (INDEPENDENT_AMBULATORY_CARE_PROVIDER_SITE_OTHER): Payer: Medicare HMO | Admitting: *Deleted

## 2023-05-17 DIAGNOSIS — Z1231 Encounter for screening mammogram for malignant neoplasm of breast: Secondary | ICD-10-CM

## 2023-05-17 DIAGNOSIS — Z0000 Encounter for general adult medical examination without abnormal findings: Secondary | ICD-10-CM

## 2023-05-17 NOTE — Patient Instructions (Signed)
Taylor Atkins , Thank you for taking time to come for your Medicare Wellness Visit. I appreciate your ongoing commitment to your health goals. Please review the following plan we discussed and let me know if I can assist you in the future.     This is a list of the screening recommended for you and due dates:  Health Maintenance  Topic Date Due   Screening for Lung Cancer  01/14/2021   COVID-19 Vaccine (6 - 2023-24 season) 05/09/2023   Mammogram  05/20/2023   DTaP/Tdap/Td vaccine (2 - Td or Tdap) 04/07/2024   Medicare Annual Wellness Visit  05/16/2024   Colon Cancer Screening  06/05/2031   Pneumonia Vaccine  Completed   DEXA scan (bone density measurement)  Completed   Hepatitis C Screening  Completed   Zoster (Shingles) Vaccine  Completed   HPV Vaccine  Aged Out   Flu Shot  Discontinued    Next appointment: Follow up in one year for your annual wellness visit.   Preventive Care 88 Years and Older, Female Preventive care refers to lifestyle choices and visits with your health care provider that can promote health and wellness. What does preventive care include? A yearly physical exam. This is also called an annual well check. Dental exams once or twice a year. Routine eye exams. Ask your health care provider how often you should have your eyes checked. Personal lifestyle choices, including: Daily care of your teeth and gums. Regular physical activity. Eating a healthy diet. Avoiding tobacco and drug use. Limiting alcohol use. Practicing safe sex. Taking low-dose aspirin every day. Taking vitamin and mineral supplements as recommended by your health care provider. What happens during an annual well check? The services and screenings done by your health care provider during your annual well check will depend on your age, overall health, lifestyle risk factors, and family history of disease. Counseling  Your health care provider may ask you questions about your: Alcohol  use. Tobacco use. Drug use. Emotional well-being. Home and relationship well-being. Sexual activity. Eating habits. History of falls. Memory and ability to understand (cognition). Work and work Astronomer. Reproductive health. Screening  You may have the following tests or measurements: Height, weight, and BMI. Blood pressure. Lipid and cholesterol levels. These may be checked every 5 years, or more frequently if you are over 4 years old. Skin check. Lung cancer screening. You may have this screening every year starting at age 59 if you have a 30-pack-year history of smoking and currently smoke or have quit within the past 15 years. Fecal occult blood test (FOBT) of the stool. You may have this test every year starting at age 7. Flexible sigmoidoscopy or colonoscopy. You may have a sigmoidoscopy every 5 years or a colonoscopy every 10 years starting at age 19. Hepatitis C blood test. Hepatitis B blood test. Sexually transmitted disease (STD) testing. Diabetes screening. This is done by checking your blood sugar (glucose) after you have not eaten for a while (fasting). You may have this done every 1-3 years. Bone density scan. This is done to screen for osteoporosis. You may have this done starting at age 19. Mammogram. This may be done every 1-2 years. Talk to your health care provider about how often you should have regular mammograms. Talk with your health care provider about your test results, treatment options, and if necessary, the need for more tests. Vaccines  Your health care provider may recommend certain vaccines, such as: Influenza vaccine. This is recommended every year.  Tetanus, diphtheria, and acellular pertussis (Tdap, Td) vaccine. You may need a Td booster every 10 years. Zoster vaccine. You may need this after age 64. Pneumococcal 13-valent conjugate (PCV13) vaccine. One dose is recommended after age 70. Pneumococcal polysaccharide (PPSV23) vaccine. One dose is  recommended after age 38. Talk to your health care provider about which screenings and vaccines you need and how often you need them. This information is not intended to replace advice given to you by your health care provider. Make sure you discuss any questions you have with your health care provider. Document Released: 09/20/2015 Document Revised: 05/13/2016 Document Reviewed: 06/25/2015 Elsevier Interactive Patient Education  2017 [employer].  Fall Prevention in the Home Falls can cause injuries. They can happen to people of all ages. There are many things you can do to make your home safe and to help prevent falls. What can I do on the outside of my home? Regularly fix the edges of walkways and driveways and fix any cracks. Remove anything that might make you trip as you walk through a door, such as a raised step or threshold. Trim any bushes or trees on the path to your home. Use bright outdoor lighting. Clear any walking paths of anything that might make someone trip, such as rocks or tools. Regularly check to see if handrails are loose or broken. Make sure that both sides of any steps have handrails. Any raised decks and porches should have guardrails on the edges. Have any leaves, snow, or ice cleared regularly. Use sand or salt on walking paths during winter. Clean up any spills in your garage right away. This includes oil or grease spills. What can I do in the bathroom? Use night lights. Install grab bars by the toilet and in the tub and shower. Do not use towel bars as grab bars. Use non-skid mats or decals in the tub or shower. If you need to sit down in the shower, use a plastic, non-slip stool. Keep the floor dry. Clean up any water that spills on the floor as soon as it happens. Remove soap buildup in the tub or shower regularly. Attach bath mats securely with double-sided non-slip rug tape. Do not have throw rugs and other things on the floor that can make you  trip. What can I do in the bedroom? Use night lights. Make sure that you have a light by your bed that is easy to reach. Do not use any sheets or blankets that are too big for your bed. They should not hang down onto the floor. Have a firm chair that has side arms. You can use this for support while you get dressed. Do not have throw rugs and other things on the floor that can make you trip. What can I do in the kitchen? Clean up any spills right away. Avoid walking on wet floors. Keep items that you use a lot in easy-to-reach places. If you need to reach something above you, use a strong step stool that has a grab bar. Keep electrical cords out of the way. Do not use floor polish or wax that makes floors slippery. If you must use wax, use non-skid floor wax. Do not have throw rugs and other things on the floor that can make you trip. What can I do with my stairs? Do not leave any items on the stairs. Make sure that there are handrails on both sides of the stairs and use them. Fix handrails that are broken or loose.  Make sure that handrails are as long as the stairways. Check any carpeting to make sure that it is firmly attached to the stairs. Fix any carpet that is loose or worn. Avoid having throw rugs at the top or bottom of the stairs. If you do have throw rugs, attach them to the floor with carpet tape. Make sure that you have a light switch at the top of the stairs and the bottom of the stairs. If you do not have them, ask someone to add them for you. What else can I do to help prevent falls? Wear shoes that: Do not have high heels. Have rubber bottoms. Are comfortable and fit you well. Are closed at the toe. Do not wear sandals. If you use a stepladder: Make sure that it is fully opened. Do not climb a closed stepladder. Make sure that both sides of the stepladder are locked into place. Ask someone to hold it for you, if possible. Clearly mark and make sure that you can  see: Any grab bars or handrails. First and last steps. Where the edge of each step is. Use tools that help you move around (mobility aids) if they are needed. These include: Canes. Walkers. Scooters. Crutches. Turn on the lights when you go into a dark area. Replace any light bulbs as soon as they burn out. Set up your furniture so you have a clear path. Avoid moving your furniture around. If any of your floors are uneven, fix them. If there are any pets around you, be aware of where they are. Review your medicines with your doctor. Some medicines can make you feel dizzy. This can increase your chance of falling. Ask your doctor what other things that you can do to help prevent falls. This information is not intended to replace advice given to you by your health care provider. Make sure you discuss any questions you have with your health care provider. Document Released: 06/20/2009 Document Revised: 01/30/2016 Document Reviewed: 09/28/2014 Elsevier Interactive Patient Education  2017 [employer].

## 2023-05-17 NOTE — Progress Notes (Signed)
Subjective:   Taylor Atkins is a 67 y.o. female who presents for Medicare Annual (Subsequent) preventive examination.  Visit Complete: Virtual  I connected with  Jasmine Pang Busby on 05/17/23 by a audio enabled telemedicine application and verified that I am speaking with the correct person using two identifiers.  Patient Location: Home  Provider Location: Office/Clinic  I discussed the limitations of evaluation and management by telemedicine. The patient expressed understanding and agreed to proceed.  Patient Medicare AWV questionnaire was completed by the patient on 05/11/23; I have confirmed that all information answered by patient is correct and no changes since this date.  Review of Systems     Cardiac Risk Factors include: advanced age (>57men, >33 women);smoking/ tobacco exposure     Objective:    Vital Signs: Unable to obtain new vitals due to this being a telehealth visit.      05/17/2023   11:11 AM 11/07/2022   10:59 AM 05/07/2022    9:07 AM 06/04/2021    9:32 AM 05/01/2021    9:02 AM 09/27/2015    7:17 AM 08/07/2015    7:54 AM  Advanced Directives  Does Patient Have a Medical Advance Directive? Yes Yes No;Yes Yes Yes Yes Yes  Type of Estate agent of [place];Living will Healthcare Power of [place];Living will Living will;Healthcare Power of Attorney Living will Healthcare Power of [place];Living will  Healthcare Power of [place];Living will  Does patient want to make changes to medical advance directive? No - Patient declined  No - Patient declined No - Patient declined   No - Patient declined  Copy of Healthcare Power of Attorney in Chart? No - copy requested  No - copy requested  No - copy requested  No - copy requested  Would patient like information on creating a medical advance directive?   No - Patient declined        Current Medications (verified) Outpatient Encounter Medications as of 05/17/2023  Medication Sig   albuterol (PROAIR  HFA) 108 (90 Base) MCG/ACT inhaler Inhale 2 puffs into the lungs every 6 (six) hours as needed for wheezing or shortness of breath.   Budeson-Glycopyrrol-Formoterol (BREZTRI AEROSPHERE) 160-9-4.8 MCG/ACT AERO Inhale 1 puff into the lungs 2 (two) times daily.   Calcium Carbonate-Vitamin D 600-400 MG-UNIT per chew tablet Chew 1 tablet by mouth 2 (two) times daily.   escitalopram (LEXAPRO) 10 MG tablet Take 1 tablet (10 mg total) by mouth daily.   Glycopyrrolate-Formoterol (BEVESPI AEROSPHERE) 9-4.8 MCG/ACT AERO Inhale 2 puffs into the lungs in the morning and at bedtime.   ibandronate (BONIVA) 150 MG tablet Take 1 tablet (150 mg total) by mouth every 30 (thirty) days. Take in the morning with a full glass of water, on an empty stomach, and do not take anything else by mouth or lie down for the next 30 min.   ipratropium-albuterol (DUONEB) 0.5-2.5 (3) MG/3ML SOLN Take 3 mLs by nebulization every 6 (six) hours as needed.   mupirocin ointment (BACTROBAN) 2 % Apply 1 Application topically daily.   traMADol (ULTRAM) 50 MG tablet Take 1-2 tablets (50-100 mg total) by mouth 3 (three) times daily as needed.   traZODone (DESYREL) 50 MG tablet Take 1 tablet (50 mg total) by mouth at bedtime.   varenicline (CHANTIX CONTINUING MONTH PAK) 1 MG tablet Take 1 tablet (1 mg total) by mouth 2 (two) times daily.   [DISCONTINUED] buPROPion (WELLBUTRIN XL) 300 MG 24 hr tablet Take 1 tablet (300 mg total) by  mouth daily. (Patient not taking: Reported on 06/16/2019)   No facility-administered encounter medications on file as of 05/17/2023.    Allergies (verified) Fosamax [alendronate sodium]   History: Past Medical History:  Diagnosis Date   Acne    Allergy    Years   Anxiety    Arthritis    Hands   Basal cell carcinoma 2021   right forearm   COPD (chronic obstructive pulmonary disease) (HCC)    Depression    Occasional   Emphysema of lung (HCC)    Goiter    Osteopenia    Pancreatic lesion 03/07/2021    Renal cyst    Skin cancer 02/2014   Squamous cell carcinoma in situ 2021   left shin   Thyroid disease    Tobacco use disorder    Past Surgical History:  Procedure Laterality Date   BUNIONECTOMY  1978   CESAREAN SECTION  1990, 1993   COLONOSCOPY  09/27/2015   Dr.Nandigam   EXCISION MASS UPPER EXTREMETIES     [facility]   HEMORRHOID SURGERY  1994   HERNIA REPAIR  08/08/2019   TUBAL LIGATION  1993   Family History  Problem Relation Age of Onset   Hypertension Mother        Afib, living   Dementia Mother    Arthritis Mother    COPD Mother    Hearing loss Mother    Obesity Mother    Heart disease Father        Afib, pacemaker, PVD   Hypertension Father    Alcohol abuse Father    Arthritis Father    Diabetes Brother    Hypertension Brother    Diabetes Brother    Hypertension Brother    Obesity Brother    Colon cancer Neg Hx    Esophageal cancer Neg Hx    Rectal cancer Neg Hx    Stomach cancer Neg Hx    Colon polyps Neg Hx    Social History   Socioeconomic History   Marital status: Married    Spouse name: Not on file   Number of children: Not on file   Years of education: Not on file   Highest education level: Not on file  Occupational History   Not on file  Tobacco Use   Smoking status: Former    Current packs/day: 0.00    Average packs/day: 0.5 packs/day for 40.0 years (20.0 ttl pk-yrs)    Types: Cigarettes    Start date: 01/13/1976    Quit date: 09/06/2022    Years since quitting: 0.6   Smokeless tobacco: Never  Vaping Use   Vaping status: Never Used  Substance and Sexual Activity   Alcohol use: Yes    Comment: Infrequent   Drug use: No   Sexual activity: Yes    Birth control/protection: Post-menopausal  Other Topics Concern   Not on file  Social History Narrative   Married   2 sons (one in college) one about to move out   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, boards horses, raises cows   Retired [employer]   Enjoys resting    Social Determinants of Research scientist (physical sciences) Strain: Low Risk  (05/11/2023)   Overall Financial Resource Strain (CARDIA)    Difficulty of Paying Living Expenses: Not hard at all  Food Insecurity: No Food Insecurity (05/11/2023)   Hunger Vital Sign    Worried About Running Out of Food in the Last Year: Never true    Ran Out of  Food in the Last Year: Never true  Transportation Needs: No Transportation Needs (05/11/2023)   PRAPARE - Administrator, Civil Service (Medical): No    Lack of Transportation (Non-Medical): No  Physical Activity: Inactive (05/11/2023)   Exercise Vital Sign    Days of Exercise per Week: 0 days    Minutes of Exercise per Session: 0 min  Stress: Stress Concern Present (05/11/2023)   [employer] of Occupational Health - Occupational Stress Questionnaire    Feeling of Stress : To some extent  Social Connections: Unknown (05/11/2023)   Social Connection and Isolation Panel [NHANES]    Frequency of Communication with Friends and Family: Once a week    Frequency of Social Gatherings with Friends and Family: Patient declined    Attends Religious Services: Not on Marketing executive or Organizations: No    Attends Banker Meetings: Never    Marital Status: Married    Tobacco Counseling Counseling given: Not Answered   Clinical Intake:  Pre-visit preparation completed: Yes  Pain : 0-10 Pain Score: 6  Pain Location: Foot Pain Orientation: Left Pain Descriptors / Indicators: Sharp, Shooting Pain Onset: More than a month ago Pain Frequency: Intermittent  Nutritional Risks: None Diabetes: No  How often do you need to have someone help you when you read instructions, pamphlets, or other written materials from your doctor or pharmacy?: 1 - Never  Interpreter Needed?: No  Information entered by :: [employer], CMA   Activities of Daily Living    05/11/2023    8:39 AM  In your present state of health, do you have any difficulty performing the following  activities:  Hearing? 0  Vision? 0  Difficulty concentrating or making decisions? 0  Walking or climbing stairs? 0  Dressing or bathing? 0  Doing errands, shopping? 0  Preparing Food and eating ? N  Using the Toilet? N  In the past six months, have you accidently leaked urine? N  Do you have problems with loss of bowel control? N  Managing your Medications? N  Managing your Finances? N  Housekeeping or managing your Housekeeping? N    Patient Care Team: Sandford Craze, NP as PCP - General (Internal Medicine) Ernesto Rutherford, MD as Consulting Physician (Ophthalmology) Sharyn Lull, Elvin So, MD as Referring Physician (Dermatology)  Indicate any recent Medical Services you may have received from other than [facility] providers in the past year (date may be approximate).     Assessment:   This is a routine wellness examination for [place].  Hearing/Vision screen No results found.   Goals Addressed   None    Depression Screen    05/17/2023   11:12 AM 12/23/2022    9:39 AM 06/23/2022    9:45 AM 05/07/2022    9:08 AM 04/24/2022   10:53 AM 05/01/2021    9:03 AM 02/26/2021    9:45 AM  PHQ 2/9 Scores  PHQ - 2 Score 4 3 0 2 0 2 5  PHQ- 9 Score 8 6 0 8  5 13     Fall Risk    05/11/2023    8:39 AM 12/23/2022    9:39 AM 06/23/2022    9:45 AM 05/07/2022    9:08 AM 04/24/2022   10:53 AM  Fall Risk   Falls in the past year? 0 0 0 0 0  Number falls in past yr: 0 0 0 0 0  Injury with Fall? 0 0 0 0 0  Risk for fall due to : No Fall Risks No Fall Risks  No Fall Risks No Fall Risks  Follow up Falls evaluation completed Falls evaluation completed  Falls evaluation completed Falls evaluation completed    MEDICARE RISK AT HOME: Medicare Risk at Home Any stairs in or around the home?: Yes If so, are there any without handrails?: No Home free of loose throw rugs in walkways, pet beds, electrical cords, etc?: Yes Adequate lighting in your home to reduce risk of falls?: Yes Life alert?:  No Use of a cane, walker or w/c?: No Grab bars in the bathroom?: No Shower chair or bench in shower?: Yes Elevated toilet seat or a handicapped toilet?: Yes  TIMED UP AND GO:  Was the test performed?  No    Cognitive Function:        05/17/2023   11:14 AM 05/07/2022    9:13 AM 05/01/2021    9:09 AM  6CIT Screen  What Year? 0 points 0 points 0 points  What month? 0 points 0 points 0 points  What time? 0 points 0 points 0 points  Count back from 20 0 points 0 points 0 points  Months in reverse 0 points 0 points 0 points  Repeat phrase 0 points 0 points 0 points  Total Score 0 points 0 points 0 points    Immunizations Immunization History  Administered Date(s) Administered   Fluad Quad(high Dose 65+) 06/16/2019, 08/21/2020   Influenza-Unspecified 06/07/2014, 05/23/2015   PFIZER Comirnaty(Gray Top)Covid-19 Tri-Sucrose Vaccine 02/26/2021   PFIZER(Purple Top)SARS-COV-2 Vaccination 09/29/2019, 10/19/2019, 06/07/2020   Pfizer Covid-19 Vaccine Bivalent Booster 52yrs & up 07/04/2021   Pneumococcal Conjugate-13 07/27/2014   Pneumococcal Polysaccharide-23 08/21/2020   Tdap 04/07/2014   Zoster Recombinant(Shingrix) 06/08/2018, 08/09/2018   Zoster, Live 08/22/2015    TDAP status: Up to date  Flu Vaccine status: Declined, Education has been provided regarding the importance of this vaccine but patient still declined. Advised may receive this vaccine at local pharmacy or Health Dept. Aware to provide a copy of the vaccination record if obtained from local pharmacy or Health Dept. Verbalized acceptance and understanding.  Pneumococcal vaccine status: Up to date  Covid-19 vaccine status: Information provided on how to obtain vaccines.   Qualifies for Shingles Vaccine? Yes   Zostavax completed Yes   Shingrix Completed?: Yes  Screening Tests Health Maintenance  Topic Date Due   Lung Cancer Screening  01/14/2021   Medicare Annual Wellness (AWV)  05/08/2023   COVID-19 Vaccine (6 -  2023-24 season) 05/09/2023   MAMMOGRAM  05/20/2023   DTaP/Tdap/Td (2 - Td or Tdap) 04/07/2024   Colonoscopy  06/05/2031   Pneumonia Vaccine 71+ Years old  Completed   DEXA SCAN  Completed   Hepatitis C Screening  Completed   Zoster Vaccines- Shingrix  Completed   HPV VACCINES  Aged Out   INFLUENZA VACCINE  Discontinued    Health Maintenance  Health Maintenance Due  Topic Date Due   Lung Cancer Screening  01/14/2021   Medicare Annual Wellness (AWV)  05/08/2023   COVID-19 Vaccine (6 - 2023-24 season) 05/09/2023    Colorectal cancer screening: Type of screening: Colonoscopy. Completed 06/04/21. Repeat every 10 years  Mammogram status: Ordered 05/17/23. Pt provided with contact info and advised to call to schedule appt.   Bone Density status: Completed 01/11/23. Results reflect: Bone density results: OSTEOPOROSIS. Repeat every 2 years.  Lung Cancer Screening: (Low Dose CT Chest recommended if Age 45-80 years, 20 pack-year currently smoking OR have  quit w/in 15years.) does qualify. Last ct scan completed on 05/03/23 with [facility]   Additional Screening:  Hepatitis C Screening: does qualify; Completed 02/16/17  Vision Screening: Recommended annual ophthalmology exams for early detection of glaucoma and other disorders of the eye. Is the patient up to date with their annual eye exam?  Yes  Who is the provider or what is the name of the office in which the patient attends annual eye exams? Dr. Mannie Stabile If pt is not established with a provider, would they like to be referred to a provider to establish care? No .   Dental Screening: Recommended annual dental exams for proper oral hygiene  Diabetic Foot Exam: N/a  Community Resource Referral / Chronic Care Management: CRR required this visit?  No   CCM required this visit?  No     Plan:     I have personally reviewed and noted the following in the patient's chart:   Medical and social history Use of alcohol, tobacco or illicit  drugs  Current medications and supplements including opioid prescriptions. Patient is not currently taking opioid prescriptions. Functional ability and status Nutritional status Physical activity Advanced directives List of other physicians Hospitalizations, surgeries, and ER visits in previous 12 months Vitals Screenings to include cognitive, depression, and falls Referrals and appointments  In addition, I have reviewed and discussed with patient certain preventive protocols, quality metrics, and best practice recommendations. A written personalized care plan for preventive services as well as general preventive health recommendations were provided to patient.     Donne Anon, CMA   05/17/2023   After Visit Summary: (MyChart) Due to this being a telephonic visit, the after visit summary with patients personalized plan was offered to patient via MyChart   Nurse Notes: None

## 2023-05-19 DIAGNOSIS — Y829 Unspecified medical devices associated with adverse incidents: Secondary | ICD-10-CM | POA: Diagnosis not present

## 2023-05-19 DIAGNOSIS — T8133XA Disruption of traumatic injury wound repair, initial encounter: Secondary | ICD-10-CM | POA: Diagnosis not present

## 2023-05-19 DIAGNOSIS — F17210 Nicotine dependence, cigarettes, uncomplicated: Secondary | ICD-10-CM | POA: Diagnosis not present

## 2023-05-19 DIAGNOSIS — L97522 Non-pressure chronic ulcer of other part of left foot with fat layer exposed: Secondary | ICD-10-CM | POA: Diagnosis not present

## 2023-05-19 DIAGNOSIS — T8130XD Disruption of wound, unspecified, subsequent encounter: Secondary | ICD-10-CM | POA: Diagnosis not present

## 2023-05-23 NOTE — ED Provider Notes (Signed)
[name]    CSN: [id number] Arrival date & time: 04/24/23  1023      History   Chief Complaint Chief Complaint  Patient presents with   Foot Injury    Non healing. Recheck - Entered by patient    HPI Taylor Atkins is a 67 y.o. female.   HPI  Patient is here for burn on her left foot.  This is a follow-up visit.  She states that the wound does not appear to be healing.  In addition she has some increased redness and pain after going to the beach.  Tylenol is not helping with pain.  We discussed referral to the wound clinic.  I feel the patient has impaired vascular status to this extremity due to her years of cigarette smoking.  Past Medical History:  Diagnosis Date   Acne    Allergy    Years   Anxiety    Arthritis    Hands   Basal cell carcinoma 2021   right forearm   COPD (chronic obstructive pulmonary disease) (HCC)    Depression    Occasional   Emphysema of lung (HCC)    Goiter    Osteopenia    Pancreatic lesion 03/07/2021   Renal cyst    Skin cancer 02/2014   Squamous cell carcinoma in situ 2021   left shin   Thyroid disease    Tobacco use disorder     Patient Active Problem List   Diagnosis Date Noted   Polydipsia 03/17/2023   Polyuria 03/17/2023   Easy bruising 03/17/2023   COPD exacerbation (HCC) 03/17/2023   Urinary frequency 12/23/2022   Abdominal cramping 12/23/2022   Essential tremor 06/23/2022   Multiple renal cysts 03/07/2021   Pancreatic lesion 03/07/2021   History of colon polyps 02/26/2021   Fatigue 02/26/2021   Generalized abdominal pain 02/26/2021   Preventative health care 02/26/2021   Basal cell carcinoma 2021   Hyperthyroidism 02/22/2017   Memory problem 06/10/2016   History of skin cancer 05/06/2015   Insomnia 05/06/2015   Multinodular goiter  see ultrasound  2014 12/28/2013   Vertigo 09/20/2013   Renal calculus, left 08/23/2012   COPD (chronic obstructive pulmonary disease) (HCC) 08/22/2012   Hematuria  07/26/2012   Asymptomatic PVCs 07/26/2012   Depression with anxiety    Tobacco abuse    ALLERGIC REACTION 02/07/2011    Past Surgical History:  Procedure Laterality Date   BUNIONECTOMY  1978   CESAREAN SECTION  1990, 1993   COLONOSCOPY  09/27/2015   Dr.Nandigam   EXCISION MASS UPPER EXTREMETIES     [facility]   HEMORRHOID SURGERY  1994   HERNIA REPAIR  08/08/2019   TUBAL LIGATION  1993    OB History     Gravida  3   Para  2   Term      Preterm      AB  1   Living         SAB  1   IAB      Ectopic      Multiple      Live Births               Home Medications    Prior to Admission medications   Medication Sig Start Date End Date Taking? Authorizing Provider  albuterol (PROAIR HFA) 108 (90 Base) MCG/ACT inhaler Inhale 2 puffs into the lungs every 6 (six) hours as needed for wheezing or shortness of breath. 12/23/22  Yes Peggyann Juba,  Melissa, NP  Budeson-Glycopyrrol-Formoterol (BREZTRI AEROSPHERE) 160-9-4.8 MCG/ACT AERO Inhale 1 puff into the lungs 2 (two) times daily. 02/19/23  Yes   Calcium Carbonate-Vitamin D 600-400 MG-UNIT per chew tablet Chew 1 tablet by mouth 2 (two) times daily. 05/28/15  Yes Sandford Craze, NP  escitalopram (LEXAPRO) 10 MG tablet Take 1 tablet (10 mg total) by mouth daily. 12/23/22  Yes Sandford Craze, NP  Glycopyrrolate-Formoterol (BEVESPI AEROSPHERE) 9-4.8 MCG/ACT AERO Inhale 2 puffs into the lungs in the morning and at bedtime. 09/14/22  Yes   ibandronate (BONIVA) 150 MG tablet Take 1 tablet (150 mg total) by mouth every 30 (thirty) days. Take in the morning with a full glass of water, on an empty stomach, and do not take anything else by mouth or lie down for the next 30 min. 12/23/22  Yes Sandford Craze, NP  ipratropium-albuterol (DUONEB) 0.5-2.5 (3) MG/3ML SOLN Take 3 mLs by nebulization every 6 (six) hours as needed. 04/20/22  Yes Saguier, Ramon Dredge, PA-C  mupirocin ointment (BACTROBAN) 2 % Apply 1 Application topically daily.  04/24/23  Yes Eustace Moore, MD  traZODone (DESYREL) 50 MG tablet Take 1 tablet (50 mg total) by mouth at bedtime. 12/23/22  Yes Sandford Craze, NP  varenicline (CHANTIX CONTINUING MONTH PAK) 1 MG tablet Take 1 tablet (1 mg total) by mouth 2 (two) times daily. 03/12/23  Yes Sandford Craze, NP  traMADol (ULTRAM) 50 MG tablet Take 1-2 tablets (50-100 mg total) by mouth 3 (three) times daily as needed. 05/09/23   Eustace Moore, MD  buPROPion (WELLBUTRIN XL) 300 MG 24 hr tablet Take 1 tablet (300 mg total) by mouth daily. Patient not taking: Reported on 06/16/2019 03/27/19 10/14/19  Sandford Craze, NP    Family History Family History  Problem Relation Age of Onset   Hypertension Mother        Afib, living   Dementia Mother    Arthritis Mother    COPD Mother    Hearing loss Mother    Obesity Mother    Heart disease Father        Afib, pacemaker, PVD   Hypertension Father    Alcohol abuse Father    Arthritis Father    Diabetes Brother    Hypertension Brother    Diabetes Brother    Hypertension Brother    Obesity Brother    Colon cancer Neg Hx    Esophageal cancer Neg Hx    Rectal cancer Neg Hx    Stomach cancer Neg Hx    Colon polyps Neg Hx     Social History Social History   Tobacco Use   Smoking status: Former    Current packs/day: 0.00    Average packs/day: 0.5 packs/day for 40.0 years (20.0 ttl pk-yrs)    Types: Cigarettes    Start date: 01/13/1976    Quit date: 09/06/2022    Years since quitting: 0.7   Smokeless tobacco: Never  Vaping Use   Vaping status: Never Used  Substance Use Topics   Alcohol use: Yes    Comment: Infrequent   Drug use: No     Allergies   Fosamax [alendronate sodium]   Review of Systems Review of Systems  See HPI Physical Exam Triage Vital Signs ED Triage Vitals  Encounter Vitals Group     BP 04/24/23 1035 (!) 146/89     Systolic BP Percentile --      Diastolic BP Percentile --      Pulse Rate 04/24/23 1035 79  Resp 04/24/23 1035 18     Temp 04/24/23 1035 98.8 F (37.1 C)     Temp Source 04/24/23 1035 Oral     SpO2 04/24/23 1035 90 %     Weight 04/24/23 1037 115 lb (52.2 kg)     Height 04/24/23 1037 5\' 2"  (1.575 m)     Head Circumference --      Peak Flow --      Pain Score 04/24/23 1037 6     Pain Loc --      Pain Education --      Exclude from Growth Chart --    No data found.  Updated Vital Signs BP (!) 146/89 (BP Location: Right Arm)   Pulse 79   Temp 98.8 F (37.1 C) (Oral)   Resp 18   Ht 5\' 2"  (1.575 m)   Wt 52.2 kg   SpO2 90%   BMI 21.03 kg/m      Physical Exam Constitutional:      General: She is not in acute distress.    Appearance: She is well-developed.  HENT:     Head: Normocephalic and atraumatic.  Eyes:     Conjunctiva/sclera: Conjunctivae normal.     Pupils: Pupils are equal, round, and reactive to light.  Cardiovascular:     Rate and Rhythm: Normal rate.  Pulmonary:     Effort: Pulmonary effort is normal. No respiratory distress.  Abdominal:     General: There is no distension.     Palpations: Abdomen is soft.  Musculoskeletal:        General: Normal range of motion.     Cervical back: Normal range of motion.  Skin:    General: Skin is warm and dry.     Findings: Lesion present.     Comments: Patient has a large wound on the dorsum of the left foot that appears to be full-thickness.  It is surrounded by erythema.  It is tender.  There is no exudate.  The area was soaked and cleansed and then rewrapped  Neurological:     Mental Status: She is alert.      UC Treatments / Results  Labs (all labs ordered are listed, but only abnormal results are displayed) Labs Reviewed - No data to display  EKG   Radiology No results found.  Procedures Procedures (including critical care time)  Medications Ordered in UC Medications - No data to display  Initial Impression / Assessment and Plan / UC Course  I have reviewed the triage vital signs and  the nursing notes.  Pertinent labs & imaging results that were available during my care of the patient were reviewed by me and considered in my medical decision making (see chart for details).     Final Clinical Impressions(s) / UC Diagnoses   Final diagnoses:  Post-traumatic wound infection     Discharge Instructions      This is healing - slowly Continue dressing changes and antibiotic ointment Use mupirocin with dressing changes Call for problems - we can arrange a wound clonic    ED Prescriptions     Medication Sig Dispense Auth. Provider   mupirocin ointment (BACTROBAN) 2 % Apply 1 Application topically daily. 22 g Eustace Moore, MD   traMADol (ULTRAM) 50 MG tablet Take 1 tablet (50 mg total) by mouth every 6 (six) hours as needed. 20 tablet Eustace Moore, MD      I have reviewed the PDMP during this encounter.   Delton See,  Letta Pate, MD 05/23/23 [phone]

## 2023-05-26 DIAGNOSIS — T8130XD Disruption of wound, unspecified, subsequent encounter: Secondary | ICD-10-CM | POA: Diagnosis not present

## 2023-05-26 DIAGNOSIS — F17210 Nicotine dependence, cigarettes, uncomplicated: Secondary | ICD-10-CM | POA: Diagnosis not present

## 2023-05-26 DIAGNOSIS — T8133XA Disruption of traumatic injury wound repair, initial encounter: Secondary | ICD-10-CM | POA: Diagnosis not present

## 2023-05-26 DIAGNOSIS — L97522 Non-pressure chronic ulcer of other part of left foot with fat layer exposed: Secondary | ICD-10-CM | POA: Diagnosis not present

## 2023-05-26 DIAGNOSIS — Y829 Unspecified medical devices associated with adverse incidents: Secondary | ICD-10-CM | POA: Diagnosis not present

## 2023-06-02 DIAGNOSIS — Y829 Unspecified medical devices associated with adverse incidents: Secondary | ICD-10-CM | POA: Diagnosis not present

## 2023-06-02 DIAGNOSIS — L97522 Non-pressure chronic ulcer of other part of left foot with fat layer exposed: Secondary | ICD-10-CM | POA: Diagnosis not present

## 2023-06-02 DIAGNOSIS — F17210 Nicotine dependence, cigarettes, uncomplicated: Secondary | ICD-10-CM | POA: Diagnosis not present

## 2023-06-02 DIAGNOSIS — T8130XD Disruption of wound, unspecified, subsequent encounter: Secondary | ICD-10-CM | POA: Diagnosis not present

## 2023-06-03 ENCOUNTER — Inpatient Hospital Stay (HOSPITAL_BASED_OUTPATIENT_CLINIC_OR_DEPARTMENT_OTHER): Admission: RE | Admit: 2023-06-03 | Payer: Medicare HMO | Source: Ambulatory Visit

## 2023-06-07 ENCOUNTER — Ambulatory Visit (HOSPITAL_BASED_OUTPATIENT_CLINIC_OR_DEPARTMENT_OTHER)
Admission: RE | Admit: 2023-06-07 | Discharge: 2023-06-07 | Disposition: A | Discharge status: Home / Self Care | Payer: Medicare HMO | Source: Ambulatory Visit | Attending: Family | Admitting: Family

## 2023-06-07 ENCOUNTER — Encounter (HOSPITAL_BASED_OUTPATIENT_CLINIC_OR_DEPARTMENT_OTHER): Payer: Self-pay

## 2023-06-07 DIAGNOSIS — J432 Centrilobular emphysema: Secondary | ICD-10-CM | POA: Diagnosis not present

## 2023-06-07 DIAGNOSIS — J9611 Chronic respiratory failure with hypoxia: Secondary | ICD-10-CM | POA: Diagnosis not present

## 2023-06-07 DIAGNOSIS — J410 Simple chronic bronchitis: Secondary | ICD-10-CM | POA: Diagnosis not present

## 2023-06-07 DIAGNOSIS — Z1231 Encounter for screening mammogram for malignant neoplasm of breast: Secondary | ICD-10-CM | POA: Insufficient documentation

## 2023-06-08 ENCOUNTER — Ambulatory Visit (HOSPITAL_BASED_OUTPATIENT_CLINIC_OR_DEPARTMENT_OTHER): Payer: Medicare HMO

## 2023-06-09 DIAGNOSIS — J410 Simple chronic bronchitis: Secondary | ICD-10-CM | POA: Diagnosis not present

## 2023-06-14 DIAGNOSIS — J9611 Chronic respiratory failure with hypoxia: Secondary | ICD-10-CM | POA: Diagnosis not present

## 2023-06-14 DIAGNOSIS — S91302A Unspecified open wound, left foot, initial encounter: Secondary | ICD-10-CM | POA: Diagnosis not present

## 2023-06-14 DIAGNOSIS — J449 Chronic obstructive pulmonary disease, unspecified: Secondary | ICD-10-CM | POA: Diagnosis not present

## 2023-06-16 DIAGNOSIS — Z872 Personal history of diseases of the skin and subcutaneous tissue: Secondary | ICD-10-CM | POA: Diagnosis not present

## 2023-06-16 DIAGNOSIS — S91302D Unspecified open wound, left foot, subsequent encounter: Secondary | ICD-10-CM | POA: Diagnosis not present

## 2023-06-16 DIAGNOSIS — Z09 Encounter for follow-up examination after completed treatment for conditions other than malignant neoplasm: Secondary | ICD-10-CM | POA: Diagnosis not present

## 2023-06-16 DIAGNOSIS — T8130XD Disruption of wound, unspecified, subsequent encounter: Secondary | ICD-10-CM | POA: Diagnosis not present

## 2023-06-17 ENCOUNTER — Other Ambulatory Visit: Payer: Self-pay | Admitting: Family

## 2023-06-25 ENCOUNTER — Ambulatory Visit (INDEPENDENT_AMBULATORY_CARE_PROVIDER_SITE_OTHER): Payer: Medicare HMO | Admitting: Family

## 2023-06-25 VITALS — BP 119/63 | HR 84 | Temp 98.2°F | Resp 16 | Ht 62.0 in | Wt 124.0 lb

## 2023-06-25 DIAGNOSIS — Z23 Encounter for immunization: Secondary | ICD-10-CM | POA: Diagnosis not present

## 2023-06-25 DIAGNOSIS — Z720 Tobacco use: Secondary | ICD-10-CM | POA: Diagnosis not present

## 2023-06-25 DIAGNOSIS — F418 Other specified anxiety disorders: Secondary | ICD-10-CM

## 2023-06-25 DIAGNOSIS — G4700 Insomnia, unspecified: Secondary | ICD-10-CM | POA: Diagnosis not present

## 2023-06-25 DIAGNOSIS — R0789 Other chest pain: Secondary | ICD-10-CM | POA: Diagnosis not present

## 2023-06-25 DIAGNOSIS — J441 Chronic obstructive pulmonary disease with (acute) exacerbation: Secondary | ICD-10-CM

## 2023-06-25 DIAGNOSIS — R635 Abnormal weight gain: Secondary | ICD-10-CM | POA: Diagnosis not present

## 2023-06-25 DIAGNOSIS — E0590 Thyrotoxicosis, unspecified without thyrotoxic crisis or storm: Secondary | ICD-10-CM | POA: Diagnosis not present

## 2023-06-25 LAB — TSH: TSH: 0.39 u[IU]/mL (ref 0.35–5.50)

## 2023-06-25 LAB — T3, FREE: T3, Free: 3.3 pg/mL (ref 2.3–4.2)

## 2023-06-25 LAB — T4, FREE: Free T4: 0.81 ng/dL (ref 0.60–1.60)

## 2023-06-25 MED ORDER — VARENICLINE TARTRATE (STARTER) 0.5 MG X 11 & 1 MG X 42 PO TBPK
ORAL_TABLET | ORAL | 0 refills | Status: DC
Start: 2023-06-25 — End: 2023-07-30

## 2023-06-25 MED ORDER — PREDNISONE 10 MG PO TABS: ORAL_TABLET | ORAL | 0 refills | Status: DC

## 2023-06-25 NOTE — Assessment & Plan Note (Signed)
New, will check tft's.

## 2023-06-25 NOTE — Assessment & Plan Note (Signed)
EKG tracing is personally reviewed.  EKG notes NSR.  No acute changes.   Will refer to cardiology for further evaluation.  She is advised to go to the ER if recurrent pain.

## 2023-06-25 NOTE — Assessment & Plan Note (Signed)
She is scheduled to see endo.

## 2023-06-25 NOTE — Assessment & Plan Note (Signed)
Continues to find relief with trazodone.

## 2023-06-25 NOTE — Assessment & Plan Note (Signed)
Stable on lexapro.   

## 2023-06-25 NOTE — Progress Notes (Signed)
Subjective:     Patient ID: Taylor Atkins, female    DOB: 10-12-1955, 67 y.o.   MRN: [id number]  Chief Complaint  Patient presents with   COPD    Here for follow up    COPD Her past medical history is significant for COPD.    Discussed the use of AI scribe software for clinical note transcription with the patient, who gave verbal consent to proceed.  History of Present Illness   The patient, with a history of COPD, osteoporosis, depression, and insomnia, presents for a routine follow-up. She reports significant weight gain, which she attributes to increased appetite and decreased physical activity. She also mentions a recent loss of her mother, which may have contributed to her decreased activity level.  The patient also reports experiencing cramps, particularly on the left side of her chest and across abdomen. These cramps are described as severe and can be triggered by simple movements such as bending over or turning to the side. The patient also mentions a sensation of something being in her left ear, though no specific symptoms are described.  The patient has a history of smoking and has recently relapsed. She expresses a desire to quit again and requests a prescription for Chantix. She also reports compliance with her inhaler Markus Daft) and finds it helpful, though she still requires additional treatments throughout the day.  The patient's mood appears stable on Lexapro, despite recent personal stressors. She continues to take trazodone for sleep but reports frequent awakenings during the night. The patient also mentions a history of tremors, for which she is scheduled to see an endocrinologist.       Wt Readings from Last 3 Encounters:  06/25/23 124 lb (56.2 kg)  05/09/23 115 lb (52.2 kg)  04/24/23 115 lb (52.2 kg)      Health Maintenance Due  Topic Date Due   Lung Cancer Screening  01/14/2021   COVID-19 Vaccine (6 - 2023-24 season) 05/09/2023    Past Medical  History:  Diagnosis Date   Acne    Allergy    Years   Anxiety    Arthritis    Hands   Basal cell carcinoma 2021   right forearm   COPD (chronic obstructive pulmonary disease) (HCC)    Depression    Occasional   Emphysema of lung (HCC)    Goiter    Osteopenia    Pancreatic lesion 03/07/2021   Renal cyst    Skin cancer 02/2014   Squamous cell carcinoma in situ 2021   left shin   Thyroid disease    Tobacco use disorder     Past Surgical History:  Procedure Laterality Date   BUNIONECTOMY  1978   CESAREAN SECTION  1990, 1993   COLONOSCOPY  09/27/2015   Dr.Nandigam   EXCISION MASS UPPER EXTREMETIES     [facility]   HEMORRHOID SURGERY  1994   HERNIA REPAIR  08/08/2019   TUBAL LIGATION  1993    Family History  Problem Relation Age of Onset   Hypertension Mother        Afib, living   Dementia Mother    Arthritis Mother    COPD Mother    Hearing loss Mother    Obesity Mother    Heart disease Father        Afib, pacemaker, PVD   Hypertension Father    Alcohol abuse Father    Arthritis Father    Diabetes Brother    Hypertension Brother  Diabetes Brother    Hypertension Brother    Obesity Brother    Colon cancer Neg Hx    Esophageal cancer Neg Hx    Rectal cancer Neg Hx    Stomach cancer Neg Hx    Colon polyps Neg Hx     Social History   Socioeconomic History   Marital status: Married    Spouse name: Not on file   Number of children: Not on file   Years of education: Not on file   Highest education level: Associate degree: occupational, Scientist, product/process development, or vocational program  Occupational History   Not on file  Tobacco Use   Smoking status: Former    Current packs/day: 0.00    Average packs/day: 0.5 packs/day for 40.0 years (20.0 ttl pk-yrs)    Types: Cigarettes    Start date: 01/13/1976    Quit date: 09/06/2022    Years since quitting: 0.8   Smokeless tobacco: Never  Vaping Use   Vaping status: Never Used  Substance and Sexual Activity   Alcohol use: Yes     Comment: Infrequent   Drug use: No   Sexual activity: Yes    Birth control/protection: Post-menopausal  Other Topics Concern   Not on file  Social History Narrative   Married   2 sons (one in college) one about to move out   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, boards horses, raises cows   Retired [employer]   Enjoys resting    Social Determinants of Corporate investment banker Strain: Low Risk  (06/24/2023)   Overall Financial Resource Strain (CARDIA)    Difficulty of Paying Living Expenses: Not hard at all  Food Insecurity: No Food Insecurity (06/24/2023)   Hunger Vital Sign    Worried About Running Out of Food in the Last Year: Never true    Ran Out of Food in the Last Year: Never true  Transportation Needs: No Transportation Needs (06/24/2023)   PRAPARE - Administrator, Civil Service (Medical): No    Lack of Transportation (Non-Medical): No  Physical Activity: Inactive (06/24/2023)   Exercise Vital Sign    Days of Exercise per Week: 0 days    Minutes of Exercise per Session: 0 min  Stress: No Stress Concern Present (06/24/2023)   [employer] of Occupational Health - Occupational Stress Questionnaire    Feeling of Stress : Only a little  Recent Concern: Stress - Stress Concern Present (05/11/2023)   [employer] of Occupational Health - Occupational Stress Questionnaire    Feeling of Stress : To some extent  Social Connections: Moderately Integrated (06/24/2023)   Social Connection and Isolation Panel [NHANES]    Frequency of Communication with Friends and Family: Twice a week    Frequency of Social Gatherings with Friends and Family: Once a week    Attends Religious Services: 1 to 4 times per year    Active Member of [employer] or Organizations: No    Attends Banker Meetings: Never    Marital Status: Married  Catering manager Violence: Not At Risk (05/17/2023)   Humiliation, Afraid, Rape, and Kick questionnaire    Fear of Current or Ex-Partner: No     Emotionally Abused: No    Physically Abused: No    Sexually Abused: No    Outpatient Medications Prior to Visit  Medication Sig Dispense Refill   albuterol (PROAIR HFA) 108 (90 Base) MCG/ACT inhaler Inhale 2 puffs into the lungs every 6 (six) hours as needed for wheezing or  shortness of breath. 8 g 4   Budeson-Glycopyrrol-Formoterol (BREZTRI AEROSPHERE) 160-9-4.8 MCG/ACT AERO Inhale 1 puff into the lungs 2 (two) times daily. 10.7 g 11   Calcium Carbonate-Vitamin D 600-400 MG-UNIT per chew tablet Chew 1 tablet by mouth 2 (two) times daily.     escitalopram (LEXAPRO) 10 MG tablet Take 1 tablet (10 mg total) by mouth daily. 90 tablet 1   ibandronate (BONIVA) 150 MG tablet Take 1 tablet (150 mg total) by mouth every 30 (thirty) days. Take in the morning with a full glass of water, on an empty stomach, and do not take anything else by mouth or lie down for the next 30 min. 3 tablet 4   ipratropium-albuterol (DUONEB) 0.5-2.5 (3) MG/3ML SOLN Take 3 mLs by nebulization every 6 (six) hours as needed. 360 mL 1   mupirocin ointment (BACTROBAN) 2 % Apply 1 Application topically daily. 22 g 0   traMADol (ULTRAM) 50 MG tablet Take 1-2 tablets (50-100 mg total) by mouth 3 (three) times daily as needed. 30 tablet 0   traZODone (DESYREL) 50 MG tablet TAKE 1/2 TO 1 TABLET EVERY NIGHT AT BEDTIME AS NEEDED FOR SLEEP, MUST SEE DR FOR FURTHER REFILLS 90 tablet 1   Glycopyrrolate-Formoterol (BEVESPI AEROSPHERE) 9-4.8 MCG/ACT AERO Inhale 2 puffs into the lungs in the morning and at bedtime. 10.7 g 11   varenicline (CHANTIX CONTINUING MONTH PAK) 1 MG tablet Take 1 tablet (1 mg total) by mouth 2 (two) times daily. 60 tablet 1   No facility-administered medications prior to visit.    Allergies  Allergen Reactions   Fosamax [Alendronate Sodium] Other (See Comments)    Extreme nausea    ROS See HPI    Objective:    Physical Exam Constitutional:      General: She is not in acute distress.    Appearance: Normal  appearance. She is well-developed.  HENT:     Head: Normocephalic and atraumatic.     Right Ear: External ear normal.     Left Ear: External ear normal.  Eyes:     General: No scleral icterus. Neck:     Thyroid: No thyromegaly.  Cardiovascular:     Rate and Rhythm: Normal rate and regular rhythm.     Heart sounds: Normal heart sounds. No murmur heard. Pulmonary:     Effort: Pulmonary effort is normal. No respiratory distress.     Breath sounds: Wheezing present.  Musculoskeletal:     Cervical back: Neck supple.  Skin:    General: Skin is warm and dry.  Neurological:     Mental Status: She is alert and oriented to person, place, and time.  Psychiatric:        Mood and Affect: Mood normal.        Behavior: Behavior normal.        Thought Content: Thought content normal.        Judgment: Judgment normal.      BP 119/63 (BP Location: Right Arm, Patient Position: Sitting, Cuff Size: Small)   Pulse 84   Temp 98.2 F (36.8 C) (Oral)   Resp 16   Ht 5\' 2"  (1.575 m)   Wt 124 lb (56.2 kg)   SpO2 96%   BMI 22.68 kg/m  Wt Readings from Last 3 Encounters:  06/25/23 124 lb (56.2 kg)  05/09/23 115 lb (52.2 kg)  04/24/23 115 lb (52.2 kg)       Assessment & Plan:   Problem List Items Addressed This Visit  Unprioritized   Weight gain    New, will check tft's.       Relevant Orders   TSH   T3, free   T4, free   Tobacco abuse - Primary    Pt began smoking again, would like to retry chantix.       Relevant Medications   Varenicline Tartrate, Starter, (CHANTIX STARTING MONTH PAK) 0.5 MG X 11 & 1 MG X 42 TBPK   Insomnia    Continues to find relief with trazodone.      Hyperthyroidism    She is scheduled to see endo.       Depression with anxiety    Stable on lexapro.       COPD exacerbation (HCC)    Some wheezing today. She notes worsening symptoms the last few days and is requesting rx for prednisone.  Please evaluate.       Relevant Medications    Varenicline Tartrate, Starter, (CHANTIX STARTING MONTH PAK) 0.5 MG X 11 & 1 MG X 42 TBPK   predniSONE (DELTASONE) 10 MG tablet   Atypical chest pain    EKG tracing is personally reviewed.  EKG notes NSR.  No acute changes.   Will refer to cardiology for further evaluation.  She is advised to go to the ER if recurrent pain.       Relevant Orders   Ambulatory referral to Cardiology   EKG 12-Lead (Completed)   Other Visit Diagnoses     Needs flu shot       Relevant Orders   Flu Vaccine Trivalent High Dose (Fluad) (Completed)       I have discontinued Jasmine Pang. Cromley's Bevespi Aerosphere and varenicline. I am also having her start on Varenicline Tartrate (Starter) and predniSONE. Additionally, I am having her maintain her Calcium Carbonate-Vitamin D, ipratropium-albuterol, escitalopram, albuterol, ibandronate, Breztri Aerosphere, mupirocin ointment, traMADol, and traZODone.  Meds ordered this encounter  Medications   Varenicline Tartrate, Starter, (CHANTIX STARTING MONTH PAK) 0.5 MG X 11 & 1 MG X 42 TBPK    Sig: Take one 0.5 mg tablet by mouth once daily for 3 days, then increase to one 0.5 mg tablet twice daily for 4 days, then increase to one 1 mg tablet twice daily.    Dispense:  53 each    Refill:  0    Order Specific Question:   Supervising Provider    Answer:   Danise Edge A [[id number]]   predniSONE (DELTASONE) 10 MG tablet    Sig: 4 tabs by mouth once daily for 2 days, then 3 tabs daily x 2 days, then 2 tabs daily x 2 days, then 1 tab daily x 2 days    Dispense:  20 tablet    Refill:  0    Order Specific Question:   Supervising Provider    Answer:   Danise Edge A [[id number]]

## 2023-06-25 NOTE — Patient Instructions (Signed)
VISIT SUMMARY:  Dear Taylor Atkins, during your recent visit, we discussed several health concerns including your weight gain, smoking habits, chest pain, and sleep disturbances. We also reviewed your current medications and vaccinations.  YOUR PLAN:  -WEIGHT GAIN: You've noticed a significant increase in your weight, which may be due to changes in your eating habits and decreased physical activity. We will check your thyroid function to rule out any medical conditions that could be contributing to your weight gain.  -TOBACCO USE: You've started smoking again and have requested to restart Chantix, a medication that can help reduce your craving for nicotine. We will start you on a Chantix starter pack and check in about three weeks to see how you're doing.  -COPD: You've been using the Breztri inhaler for your chronic obstructive pulmonary disease (COPD), a lung disease that makes it hard to breathe, and find it helpful. We will continue this treatment.  -ATYPICAL CHEST PAIN: You've been experiencing frequent cramps and pain, especially on your left side. Given your family history of heart disease, we will perform an EKG, a test that checks for problems with the electrical activity of your heart, and refer you to a cardiologist for further evaluation.  -MENTAL HEALTH: Taylor Atkins been taking Lexapro for your mental health and feel that it's managing your mood adequately. We will continue this treatment.  -SLEEP: You've been using Trazodone to help you sleep but have been waking up frequently during the night. We will continue this treatment and monitor your sleep patterns.  -OSTEOPOROSIS: You've been taking Boniva once a month for your osteoporosis, a condition that weakens bones and makes them more likely to break. We will continue this treatment.  -DEPRESSION: You've been taking Wellbutrin for smoking cessation, which also helps with depression. We will continue this treatment.  -GENERAL HEALTH  MAINTENANCE: We administered the influenza vaccine during your visit. We also recommend that you receive the COVID and RSV vaccines at the pharmacy on a different day. We will schedule a lung cancer screening CT scan in December and provide a prescription for prednisone to have on hand.  INSTRUCTIONS:  Please remember to pick up your Chantix starter pack from the pharmacy and start taking it as directed. Also, please make sure to get your COVID and RSV vaccines at the pharmacy. We will check in with you in about three weeks to see how you're doing with the Chantix. We will also schedule an EKG and a lung cancer screening CT scan in December. Please continue taking your current medications as directed.

## 2023-06-25 NOTE — Assessment & Plan Note (Signed)
Pt began smoking again, would like to retry chantix.

## 2023-06-25 NOTE — Assessment & Plan Note (Signed)
Some wheezing today. She notes worsening symptoms the last few days and is requesting rx for prednisone.  Please evaluate.

## 2023-07-07 DIAGNOSIS — J9611 Chronic respiratory failure with hypoxia: Secondary | ICD-10-CM | POA: Diagnosis not present

## 2023-07-14 ENCOUNTER — Other Ambulatory Visit: Payer: Self-pay | Admitting: Family

## 2023-07-15 DIAGNOSIS — J449 Chronic obstructive pulmonary disease, unspecified: Secondary | ICD-10-CM | POA: Diagnosis not present

## 2023-07-15 DIAGNOSIS — J9611 Chronic respiratory failure with hypoxia: Secondary | ICD-10-CM | POA: Diagnosis not present

## 2023-07-27 DIAGNOSIS — R251 Tremor, unspecified: Secondary | ICD-10-CM | POA: Diagnosis not present

## 2023-07-27 DIAGNOSIS — E0590 Thyrotoxicosis, unspecified without thyrotoxic crisis or storm: Secondary | ICD-10-CM | POA: Diagnosis not present

## 2023-07-29 ENCOUNTER — Encounter: Payer: Self-pay | Admitting: Family

## 2023-07-30 MED ORDER — VARENICLINE TARTRATE 1 MG PO TABS: 1.0000 mg | ORAL_TABLET | Freq: Two times a day (BID) | ORAL | 1 refills | Status: DC

## 2023-08-02 ENCOUNTER — Ambulatory Visit: Payer: Medicare HMO

## 2023-08-07 DIAGNOSIS — J9611 Chronic respiratory failure with hypoxia: Secondary | ICD-10-CM | POA: Diagnosis not present

## 2023-08-11 DIAGNOSIS — R911 Solitary pulmonary nodule: Secondary | ICD-10-CM | POA: Diagnosis not present

## 2023-08-11 DIAGNOSIS — J432 Centrilobular emphysema: Secondary | ICD-10-CM | POA: Diagnosis not present

## 2023-08-14 DIAGNOSIS — J9611 Chronic respiratory failure with hypoxia: Secondary | ICD-10-CM | POA: Diagnosis not present

## 2023-08-14 DIAGNOSIS — J449 Chronic obstructive pulmonary disease, unspecified: Secondary | ICD-10-CM | POA: Diagnosis not present

## 2023-08-27 DIAGNOSIS — E0590 Thyrotoxicosis, unspecified without thyrotoxic crisis or storm: Secondary | ICD-10-CM | POA: Diagnosis not present

## 2023-09-06 DIAGNOSIS — J9611 Chronic respiratory failure with hypoxia: Secondary | ICD-10-CM | POA: Diagnosis not present

## 2023-09-07 DIAGNOSIS — F17210 Nicotine dependence, cigarettes, uncomplicated: Secondary | ICD-10-CM | POA: Diagnosis not present

## 2023-09-07 DIAGNOSIS — J432 Centrilobular emphysema: Secondary | ICD-10-CM | POA: Diagnosis not present

## 2023-09-07 DIAGNOSIS — J9611 Chronic respiratory failure with hypoxia: Secondary | ICD-10-CM | POA: Diagnosis not present

## 2023-09-07 DIAGNOSIS — J410 Simple chronic bronchitis: Secondary | ICD-10-CM | POA: Diagnosis not present

## 2023-09-09 DIAGNOSIS — H2513 Age-related nuclear cataract, bilateral: Secondary | ICD-10-CM | POA: Diagnosis not present

## 2023-09-09 DIAGNOSIS — D3131 Benign neoplasm of right choroid: Secondary | ICD-10-CM | POA: Diagnosis not present

## 2023-10-01 DIAGNOSIS — E0590 Thyrotoxicosis, unspecified without thyrotoxic crisis or storm: Secondary | ICD-10-CM | POA: Diagnosis not present

## 2023-10-01 DIAGNOSIS — R252 Cramp and spasm: Secondary | ICD-10-CM | POA: Diagnosis not present

## 2023-10-01 DIAGNOSIS — R251 Tremor, unspecified: Secondary | ICD-10-CM | POA: Diagnosis not present

## 2023-10-08 DIAGNOSIS — Z1330 Encounter for screening examination for mental health and behavioral disorders, unspecified: Secondary | ICD-10-CM | POA: Diagnosis not present

## 2023-10-08 DIAGNOSIS — R251 Tremor, unspecified: Secondary | ICD-10-CM | POA: Diagnosis not present

## 2023-10-25 DIAGNOSIS — C44729 Squamous cell carcinoma of skin of left lower limb, including hip: Secondary | ICD-10-CM | POA: Diagnosis not present

## 2023-10-28 DIAGNOSIS — H903 Sensorineural hearing loss, bilateral: Secondary | ICD-10-CM | POA: Diagnosis not present

## 2023-11-04 ENCOUNTER — Other Ambulatory Visit: Payer: Self-pay

## 2023-11-04 ENCOUNTER — Ambulatory Visit
Admission: RE | Admit: 2023-11-04 | Discharge: 2023-11-04 | Disposition: A | Discharge status: Home / Self Care | Payer: PPO | Source: Ambulatory Visit

## 2023-11-04 VITALS — HR 105 | Temp 98.5°F | Resp 24

## 2023-11-04 DIAGNOSIS — Z20822 Contact with and (suspected) exposure to covid-19: Secondary | ICD-10-CM | POA: Diagnosis not present

## 2023-11-04 DIAGNOSIS — E0590 Thyrotoxicosis, unspecified without thyrotoxic crisis or storm: Secondary | ICD-10-CM | POA: Diagnosis not present

## 2023-11-04 DIAGNOSIS — J440 Chronic obstructive pulmonary disease with acute lower respiratory infection: Secondary | ICD-10-CM | POA: Diagnosis not present

## 2023-11-04 DIAGNOSIS — J189 Pneumonia, unspecified organism: Secondary | ICD-10-CM | POA: Diagnosis not present

## 2023-11-04 DIAGNOSIS — Z79899 Other long term (current) drug therapy: Secondary | ICD-10-CM | POA: Diagnosis not present

## 2023-11-04 DIAGNOSIS — B9729 Other coronavirus as the cause of diseases classified elsewhere: Secondary | ICD-10-CM | POA: Diagnosis not present

## 2023-11-04 DIAGNOSIS — Z9981 Dependence on supplemental oxygen: Secondary | ICD-10-CM | POA: Diagnosis not present

## 2023-11-04 DIAGNOSIS — R0902 Hypoxemia: Secondary | ICD-10-CM | POA: Diagnosis not present

## 2023-11-04 DIAGNOSIS — R6889 Other general symptoms and signs: Secondary | ICD-10-CM

## 2023-11-04 DIAGNOSIS — R251 Tremor, unspecified: Secondary | ICD-10-CM | POA: Diagnosis not present

## 2023-11-04 DIAGNOSIS — Z7951 Long term (current) use of inhaled steroids: Secondary | ICD-10-CM | POA: Diagnosis not present

## 2023-11-04 DIAGNOSIS — R509 Fever, unspecified: Secondary | ICD-10-CM | POA: Diagnosis not present

## 2023-11-04 DIAGNOSIS — R918 Other nonspecific abnormal finding of lung field: Secondary | ICD-10-CM | POA: Diagnosis not present

## 2023-11-04 DIAGNOSIS — F32A Depression, unspecified: Secondary | ICD-10-CM | POA: Diagnosis not present

## 2023-11-04 DIAGNOSIS — I491 Atrial premature depolarization: Secondary | ICD-10-CM | POA: Diagnosis not present

## 2023-11-04 DIAGNOSIS — J9611 Chronic respiratory failure with hypoxia: Secondary | ICD-10-CM | POA: Diagnosis not present

## 2023-11-04 DIAGNOSIS — L97522 Non-pressure chronic ulcer of other part of left foot with fat layer exposed: Secondary | ICD-10-CM | POA: Diagnosis not present

## 2023-11-04 DIAGNOSIS — J441 Chronic obstructive pulmonary disease with (acute) exacerbation: Secondary | ICD-10-CM

## 2023-11-04 DIAGNOSIS — R0600 Dyspnea, unspecified: Secondary | ICD-10-CM

## 2023-11-04 DIAGNOSIS — J449 Chronic obstructive pulmonary disease, unspecified: Secondary | ICD-10-CM | POA: Diagnosis not present

## 2023-11-04 DIAGNOSIS — J439 Emphysema, unspecified: Secondary | ICD-10-CM | POA: Diagnosis not present

## 2023-11-04 DIAGNOSIS — J159 Unspecified bacterial pneumonia: Secondary | ICD-10-CM | POA: Diagnosis not present

## 2023-11-04 DIAGNOSIS — J9621 Acute and chronic respiratory failure with hypoxia: Secondary | ICD-10-CM | POA: Diagnosis not present

## 2023-11-04 DIAGNOSIS — Z87891 Personal history of nicotine dependence: Secondary | ICD-10-CM | POA: Diagnosis not present

## 2023-11-04 MED ORDER — IPRATROPIUM-ALBUTEROL 0.5-2.5 (3) MG/3ML IN SOLN
3.0000 mL | Freq: Once | RESPIRATORY_TRACT | Status: AC
  Administered 2023-11-04: 3 mL via RESPIRATORY_TRACT

## 2023-11-04 NOTE — ED Provider Notes (Signed)
 [name]    CSN: [id number] Arrival date & time: 11/04/23  7829      History   Chief Complaint Chief Complaint  Patient presents with   Wheezing    SOB INCREASED COUGH MUSCLE AND JOINT ACHINESS AND WHEEZING - Entered by patient    HPI REHMAT MURTAGH is a 68 y.o. female.   HPI 68 year old female presents with shortness of breath, wheezing and increased respiratory rate with dyspnea.  PMH significant for COPD, emphysema and BCC.  At triage patient SpO2 at 86% increasing to 92% after seated for several minutes.  Supplemental oxygen 2 L via nasal cannula started.  Patient reports that she drove herself here today to clinic because her husband had a doctor's appointment.  Patient reports her pulse ox drops all the time and she just needs several minutes to sit, then pulse ox will come right back up.  Past Medical History:  Diagnosis Date   Acne    Allergy    Years   Anxiety    Arthritis    Hands   Basal cell carcinoma 2021   right forearm   COPD (chronic obstructive pulmonary disease) (HCC)    Depression    Occasional   Emphysema of lung (HCC)    Goiter    Osteopenia    Pancreatic lesion 03/07/2021   Renal cyst    Skin cancer 02/2014   Squamous cell carcinoma in situ 2021   left shin   Thyroid disease    Tobacco use disorder     Patient Active Problem List   Diagnosis Date Noted   Weight gain 06/25/2023   Atypical chest pain 06/25/2023   Polydipsia 03/17/2023   Polyuria 03/17/2023   Easy bruising 03/17/2023   COPD exacerbation (HCC) 03/17/2023   Urinary frequency 12/23/2022   Abdominal cramping 12/23/2022   Essential tremor 06/23/2022   Multiple renal cysts 03/07/2021   Pancreatic lesion 03/07/2021   History of colon polyps 02/26/2021   Fatigue 02/26/2021   Generalized abdominal pain 02/26/2021   Preventative health care 02/26/2021   Basal cell carcinoma 2021   Hyperthyroidism 02/22/2017   Memory problem 06/10/2016   History of skin  cancer 05/06/2015   Insomnia 05/06/2015   Multinodular goiter  see ultrasound  2014 12/28/2013   Vertigo 09/20/2013   Renal calculus, left 08/23/2012   COPD (chronic obstructive pulmonary disease) (HCC) 08/22/2012   Hematuria 07/26/2012   Asymptomatic PVCs 07/26/2012   Depression with anxiety    Tobacco abuse    Allergy 02/07/2011    Past Surgical History:  Procedure Laterality Date   BUNIONECTOMY  1978   CESAREAN SECTION  1990, 1993   COLONOSCOPY  09/27/2015   Dr.Nandigam   EXCISION MASS UPPER EXTREMETIES     [facility]   HEMORRHOID SURGERY  1994   HERNIA REPAIR  08/08/2019   TUBAL LIGATION  1993    OB History     Gravida  3   Para  2   Term      Preterm      AB  1   Living         SAB  1   IAB      Ectopic      Multiple      Live Births               Home Medications    Prior to Admission medications   Medication Sig Start Date End Date Taking? Authorizing Provider  primidone (MYSOLINE)  50 MG tablet Take by mouth 2 (two) times daily.   Yes [provider]  roflumilast (DALIRESP) 500 MCG TABS tablet Take 250 mcg by mouth daily.   Yes [provider]  albuterol (PROAIR HFA) 108 (90 Base) MCG/ACT inhaler Inhale 2 puffs into the lungs every 6 (six) hours as needed for wheezing or shortness of breath. 12/23/22   Sandford Craze, NP  Budeson-Glycopyrrol-Formoterol (BREZTRI AEROSPHERE) 160-9-4.8 MCG/ACT AERO Inhale 1 puff into the lungs 2 (two) times daily. 02/19/23     Calcium Carbonate-Vitamin D 600-400 MG-UNIT per chew tablet Chew 1 tablet by mouth 2 (two) times daily. 05/28/15   Sandford Craze, NP  escitalopram (LEXAPRO) 10 MG tablet TAKE 1 TABLET BY MOUTH DAILY 07/15/23   Sandford Craze, NP  ibandronate (BONIVA) 150 MG tablet Take 1 tablet (150 mg total) by mouth every 30 (thirty) days. Take in the morning with a full glass of water, on an empty stomach, and do not take anything else by mouth or lie down for the next 30 min.  12/23/22   Sandford Craze, NP  ipratropium-albuterol (DUONEB) 0.5-2.5 (3) MG/3ML SOLN Take 3 mLs by nebulization every 6 (six) hours as needed. 04/20/22   Saguier, Ramon Dredge, PA-C  mupirocin ointment (BACTROBAN) 2 % Apply 1 Application topically daily. 04/24/23   Eustace Moore, MD  traMADol (ULTRAM) 50 MG tablet Take 1-2 tablets (50-100 mg total) by mouth 3 (three) times daily as needed. 05/09/23   Eustace Moore, MD  traZODone (DESYREL) 50 MG tablet TAKE 1/2 TO 1 TABLET EVERY NIGHT AT BEDTIME AS NEEDED FOR SLEEP, MUST SEE DR FOR FURTHER REFILLS 06/17/23   Sandford Craze, NP  buPROPion (WELLBUTRIN XL) 300 MG 24 hr tablet Take 1 tablet (300 mg total) by mouth daily. Patient not taking: Reported on 06/16/2019 03/27/19 10/14/19  Sandford Craze, NP    Family History Family History  Problem Relation Age of Onset   Hypertension Mother        Afib, living   Dementia Mother    Arthritis Mother    COPD Mother    Hearing loss Mother    Obesity Mother    Heart disease Father        Afib, pacemaker, PVD   Hypertension Father    Alcohol abuse Father    Arthritis Father    Diabetes Brother    Hypertension Brother    Diabetes Brother    Hypertension Brother    Obesity Brother    Colon cancer Neg Hx    Esophageal cancer Neg Hx    Rectal cancer Neg Hx    Stomach cancer Neg Hx    Colon polyps Neg Hx     Social History Social History   Tobacco Use   Smoking status: Former    Current packs/day: 0.00    Average packs/day: 0.5 packs/day for 40.0 years (20.0 ttl pk-yrs)    Types: Cigarettes    Start date: 01/13/1976    Quit date: 09/06/2022    Years since quitting: 1.1   Smokeless tobacco: Never  Vaping Use   Vaping status: Never Used  Substance Use Topics   Alcohol use: Yes    Comment: Infrequent   Drug use: No     Allergies   Fosamax [alendronate sodium]   Review of Systems Review of Systems   Physical Exam Triage Vital Signs ED Triage Vitals  Encounter Vitals  Group     BP --      Systolic BP Percentile --  Diastolic BP Percentile --      Pulse Rate 11/04/23 0935 (!) 105     Resp 11/04/23 0935 (!) 28     Temp 11/04/23 0935 98.5 F (36.9 C)     Temp src --      SpO2 11/04/23 0935 (!) 86 %     Weight --      Height --      Head Circumference --      Peak Flow --      Pain Score 11/04/23 0938 6     Pain Loc --      Pain Education --      Exclude from Growth Chart --    No data found.  Updated Vital Signs Pulse (!) 105   Temp 98.5 F (36.9 C)   Resp (!) 24   SpO2 93% Comment: ra   Physical Exam Vitals and nursing note reviewed.  Constitutional:      Appearance: Normal appearance. She is normal weight. She is ill-appearing.  HENT:     Head: Normocephalic and atraumatic.     Mouth/Throat:     Mouth: Mucous membranes are moist.     Pharynx: Oropharynx is clear.  Eyes:     Conjunctiva/sclera: Conjunctivae normal.     Pupils: Pupils are equal, round, and reactive to light.  Cardiovascular:     Rate and Rhythm: Regular rhythm. Tachycardia present.     Pulses: Normal pulses.     Heart sounds: Normal heart sounds. No murmur heard.    No friction rub. No gallop.  Pulmonary:     Breath sounds: Wheezing present.     Comments: Diffuse scattered crackles noted throughout, patient on 2 L of O2 via nasal cannula for 20 minutes with SpO2 ranging from 94-95, respirations 24-36; patient's husband arrive just prior to DuoNeb initiation Musculoskeletal:        General: Normal range of motion.     Cervical back: Normal range of motion and neck supple.  Skin:    General: Skin is warm and dry.  Neurological:     General: No focal deficit present.     Mental Status: She is alert and oriented to person, place, and time. Mental status is at baseline.  Psychiatric:        Mood and Affect: Mood normal.        Behavior: Behavior normal.      UC Treatments / Results  Labs (all labs ordered are listed, but only abnormal results are  displayed) Labs Reviewed  POC COVID19/FLU A&B COMBO  POC SARS CORONAVIRUS 2 AG -  ED  POCT INFLUENZA A/B    EKG   Radiology No results found.  Procedures Procedures (including critical care time)  Medications Ordered in UC Medications  ipratropium-albuterol (DUONEB) 0.5-2.5 (3) MG/3ML nebulizer solution 3 mL (3 mLs Nebulization Given 11/04/23 0954)    Initial Impression / Assessment and Plan / UC Course  I have reviewed the triage vital signs and the nursing notes.  Pertinent labs & imaging results that were available during my care of the patient were reviewed by me and considered in my medical decision making (see chart for details).     MDM: 1.  Dyspnea, unspecified type-20 minutes of supplemental oxygen at 2 L via nasal cannula and DuoNeb given once in clinic patient's pulse ox down to 90 to 92%.  Advised patient/husband to go to [facility] ED now for further evaluation of COPD  exacerbation and shortness of breath to include imaging.  Patient and husband agreed and verbalized understanding of these instructions and this plan of care this morning. 2.  COPD exacerbation-DuoNeb 0.5-2.5 (3) MG/3 mL nebulizer solution given once in clinic; 3.  Flulike symptoms influenza A and COVID-19 both negative this morning.  Patient discharged to ED hemodynamically stable with SpO2 of 93% Final Clinical Impressions(s) / UC Diagnoses   Final diagnoses:  COPD exacerbation (HCC)  Dyspnea, unspecified type  Flu-like symptoms     Discharge Instructions      Advised patient/husband to go to [facility] ED now for further evaluation of COPD exacerbation and shortness of breath to include imaging.     ED Prescriptions   None    PDMP not reviewed this encounter.   Trevor Iha, FNP 11/04/23 1029

## 2023-11-04 NOTE — ED Triage Notes (Signed)
 Sick since Tuesday wheezing, body aches, sob. Has copd. Has had fever. Has been taking mucinex, tylenol.

## 2023-11-04 NOTE — ED Notes (Signed)
 Patient is being discharged from the Urgent Care and sent to the Emergency Department via pov . Per ragan np, patient is in need of higher level of care due to need for ct, sob and low sats. Patient is aware and verbalizes understanding of plan of care.  Vitals:   11/04/23 0942 11/04/23 1004  Pulse:    Resp:    Temp:    SpO2: 95% 93%

## 2023-11-04 NOTE — Discharge Instructions (Addendum)
 Advised patient/husband to go to [facility] ED now for further evaluation of COPD exacerbation and shortness of breath to include imaging.

## 2023-11-08 ENCOUNTER — Telehealth: Payer: Self-pay

## 2023-11-08 NOTE — Transitions of Care (Post Inpatient/ED Visit) (Signed)
 11/08/2023  Name: Taylor Atkins MRN: [id number] DOB: 17-Mar-1956  Today's TOC FU Call Status: Today's TOC FU Call Status:: Successful TOC FU Call Completed TOC FU Call Complete Date: 11/08/23 Patient's Name and Date of Birth confirmed.  Transition Care Management Follow-up Telephone Call Date of Discharge: 11/07/23 Discharge Facility: Other ([facility]) Name of Other ([facility]) Discharge Facility: [facility] Type of Discharge: Inpatient Admission Primary Inpatient Discharge Diagnosis:: "multifocal PNA" How have you been since you were released from the hospital?: Better (Pt voices doing better-rested fairly well last night-took sleeping pill. Breathing is managed-wearing oxygen 2L/min 24/7 she is taking neb txs & inhalers,occassional cough, appetite fair-eating smaller portions, No BM-will take stool softener today) Any questions or concerns?: No  Items Reviewed: Did you receive and understand the discharge instructions provided?: Yes Medications obtained,verified, and reconciled?: Yes (Medications Reviewed) Any new allergies since your discharge?: No Dietary orders reviewed?: Yes Type of Diet Ordered:: low salt/heart healthy Do you have support at home?: Yes People in Home: spouse Name of Support/Comfort Primary Source: spouse  Medications Reviewed Today: Medications Reviewed Today     Reviewed by Charlyn Minerva, RN (Registered Nurse) on 11/08/23 at 1204  Med List Status: <None>   Medication Order Taking? Sig Documenting Provider Last Dose Status Informant  albuterol (PROAIR HFA) 108 (90 Base) MCG/ACT inhaler [id number] Yes Inhale 2 puffs into the lungs every 6 (six) hours as needed for wheezing or shortness of breath. Sandford Craze, NP 11/08/2023 Active   bisacodyl (DULCOLAX) 5 MG EC tablet [id number] Yes Take 5 mg by mouth daily as needed for moderate constipation. [provider] Taking Active Self   Budeson-Glycopyrrol-Formoterol (BREZTRI AEROSPHERE) 160-9-4.8 MCG/ACT AERO [id number] Yes Inhale 1 puff into the lungs 2 (two) times daily.  11/08/2023 Active    Patient not taking:   Discontinued 10/14/19 0858   Calcium Carbonate-Vitamin D 600-400 MG-UNIT per chew tablet [id number] No Chew 1 tablet by mouth 2 (two) times daily.  Patient not taking: Reported on 11/08/2023   Sandford Craze, NP Not Taking Active   cefdinir (OMNICEF) 300 MG capsule [id number] Yes Take 300 mg by mouth 2 (two) times daily. Take 1 capsule (300 mg total) by mouth 2 (two) times a day for 2 days. [provider] 11/08/2023 Active Self  escitalopram (LEXAPRO) 10 MG tablet [id number] Yes TAKE 1 TABLET BY MOUTH DAILY Sandford Craze, NP 11/07/2023 Active   ibandronate (BONIVA) 150 MG tablet [id number] Yes Take 1 tablet (150 mg total) by mouth every 30 (thirty) days. Take in the morning with a full glass of water, on an empty stomach, and do not take anything else by mouth or lie down for the next 30 min. Sandford Craze, NP Taking Active   ipratropium-albuterol (DUONEB) 0.5-2.5 (3) MG/3ML SOLN [id number] Yes Take 3 mLs by nebulization every 6 (six) hours as needed. SaguierRamon Dredge, PA-C 11/08/2023 Active   mupirocin ointment (BACTROBAN) 2 % [id number] No Apply 1 Application topically daily.  Patient not taking: Reported on 11/08/2023   Eustace Moore, MD Not Taking Active   predniSONE (DELTASONE) 20 MG tablet [id number] Yes Take 2 tablets by mouth daily with breakfast. Take 2 tablets (40 mg total) by mouth daily for 2 days. Start taking on: November 08, 2023 [provider] 11/08/2023 Active Self  primidone (MYSOLINE) 50 MG tablet [id number] Yes Take by mouth 2 (two) times daily. [provider] 11/08/2023 Active   roflumilast (DALIRESP) 500 MCG TABS tablet [id number] Yes Take 250 mcg by  mouth daily. [provider] 11/08/2023 Active   traMADol (ULTRAM) 50 MG tablet [id number] No Take 1-2 tablets (50-100 mg  total) by mouth 3 (three) times daily as needed.  Patient not taking: Reported on 11/08/2023   Eustace Moore, MD Not Taking Active   traZODone (DESYREL) 50 MG tablet [id number] Yes TAKE 1/2 TO 1 TABLET EVERY NIGHT AT BEDTIME AS NEEDED FOR SLEEP, MUST SEE DR FOR FURTHER REFILLS Sandford Craze, NP 11/07/2023 Active             Home Care and Equipment/Supplies: Were Home Health Services Ordered?: NA Any new equipment or medical supplies ordered?: NA  Functional Questionnaire: Do you need assistance with bathing/showering or dressing?: No Do you need assistance with meal preparation?: No Do you need assistance with eating?: No Do you have difficulty maintaining continence: No Do you need assistance with getting out of bed/getting out of a chair/moving?: No Do you have difficulty managing or taking your medications?: No  Follow up appointments reviewed: PCP Follow-up appointment confirmed?: No (Pt voices that hospital set up follow up appt with [facility] on 3/27, she did not want to make PCP appt at this time, will call office if & when she changes her mind) MD Provider Line Number:[phone] Given: No Specialist Hospital Follow-up appointment confirmed?: NA Do you need transportation to your follow-up appointment?: No Do you understand care options if your condition(s) worsen?: Yes-patient verbalized understanding  SDOH Interventions Today    Flowsheet Row Most Recent Value  SDOH Interventions   Food Insecurity Interventions Intervention Not Indicated  Housing Interventions Intervention Not Indicated  Transportation Interventions Intervention Not Indicated  Utilities Interventions Intervention Not Indicated      TOC Interventions Today    Flowsheet Row Most Recent Value  TOC Interventions   TOC Interventions Discussed/Reviewed TOC Interventions Discussed      TOC Interventions Today    Flowsheet Row Most Recent Value  TOC Interventions   TOC  Interventions Discussed/Reviewed TOC Interventions Discussed      Interventions Today    Flowsheet Row Most Recent Value  General Interventions   General Interventions Discussed/Reviewed General Interventions Discussed, Durable Medical Equipment (DME)  Durable Medical Equipment (DME) Oxygen  Education Interventions   Education Provided Provided Education  Provided Verbal Education On Nutrition, Medication, When to see the doctor, Insurance Plans, Other  [resp symptom management and bowel regimen management]  Nutrition Interventions   Nutrition Discussed/Reviewed Nutrition Discussed, Adding fruits and vegetables, Fluid intake  Pharmacy Interventions   Pharmacy Dicussed/Reviewed Pharmacy Topics Discussed  Safety Interventions   Safety Discussed/Reviewed Safety Discussed          Antionette Fairy, RN,BSN, CCM [place]  VBCI-Population Health Manager Population Health Direct Dial: [phone]

## 2023-11-15 DIAGNOSIS — C44729 Squamous cell carcinoma of skin of left lower limb, including hip: Secondary | ICD-10-CM | POA: Diagnosis not present

## 2023-11-30 DIAGNOSIS — Z09 Encounter for follow-up examination after completed treatment for conditions other than malignant neoplasm: Secondary | ICD-10-CM | POA: Diagnosis not present

## 2023-11-30 DIAGNOSIS — K921 Melena: Secondary | ICD-10-CM | POA: Diagnosis not present

## 2023-11-30 DIAGNOSIS — J189 Pneumonia, unspecified organism: Secondary | ICD-10-CM | POA: Diagnosis not present

## 2023-11-30 DIAGNOSIS — R0602 Shortness of breath: Secondary | ICD-10-CM | POA: Diagnosis not present

## 2023-11-30 DIAGNOSIS — D649 Anemia, unspecified: Secondary | ICD-10-CM | POA: Diagnosis not present

## 2023-11-30 DIAGNOSIS — R194 Change in bowel habit: Secondary | ICD-10-CM | POA: Diagnosis not present

## 2023-11-30 DIAGNOSIS — Z7689 Persons encountering health services in other specified circumstances: Secondary | ICD-10-CM | POA: Diagnosis not present

## 2023-11-30 DIAGNOSIS — J9621 Acute and chronic respiratory failure with hypoxia: Secondary | ICD-10-CM | POA: Diagnosis not present

## 2023-12-03 DIAGNOSIS — J449 Chronic obstructive pulmonary disease, unspecified: Secondary | ICD-10-CM | POA: Diagnosis not present

## 2023-12-03 DIAGNOSIS — J9611 Chronic respiratory failure with hypoxia: Secondary | ICD-10-CM | POA: Diagnosis not present

## 2024-01-03 DIAGNOSIS — J449 Chronic obstructive pulmonary disease, unspecified: Secondary | ICD-10-CM | POA: Diagnosis not present

## 2024-01-03 DIAGNOSIS — J9611 Chronic respiratory failure with hypoxia: Secondary | ICD-10-CM | POA: Diagnosis not present

## 2024-01-12 DIAGNOSIS — J9611 Chronic respiratory failure with hypoxia: Secondary | ICD-10-CM | POA: Diagnosis not present

## 2024-01-12 DIAGNOSIS — J449 Chronic obstructive pulmonary disease, unspecified: Secondary | ICD-10-CM | POA: Diagnosis not present

## 2024-01-15 ENCOUNTER — Other Ambulatory Visit: Payer: Self-pay | Admitting: Family

## 2024-01-17 DIAGNOSIS — J432 Centrilobular emphysema: Secondary | ICD-10-CM | POA: Diagnosis not present

## 2024-01-17 DIAGNOSIS — J410 Simple chronic bronchitis: Secondary | ICD-10-CM | POA: Diagnosis not present

## 2024-01-17 DIAGNOSIS — F17210 Nicotine dependence, cigarettes, uncomplicated: Secondary | ICD-10-CM | POA: Diagnosis not present

## 2024-01-17 DIAGNOSIS — J9611 Chronic respiratory failure with hypoxia: Secondary | ICD-10-CM | POA: Diagnosis not present

## 2024-01-17 NOTE — Telephone Encounter (Signed)
 Please contact pt to schedule a follow up appointment.

## 2024-02-01 DIAGNOSIS — J449 Chronic obstructive pulmonary disease, unspecified: Secondary | ICD-10-CM | POA: Diagnosis not present

## 2024-02-01 DIAGNOSIS — R0602 Shortness of breath: Secondary | ICD-10-CM | POA: Diagnosis not present

## 2024-02-01 DIAGNOSIS — R918 Other nonspecific abnormal finding of lung field: Secondary | ICD-10-CM | POA: Diagnosis not present

## 2024-02-01 DIAGNOSIS — R062 Wheezing: Secondary | ICD-10-CM | POA: Diagnosis not present

## 2024-02-01 DIAGNOSIS — R0989 Other specified symptoms and signs involving the circulatory and respiratory systems: Secondary | ICD-10-CM | POA: Diagnosis not present

## 2024-02-03 DIAGNOSIS — J449 Chronic obstructive pulmonary disease, unspecified: Secondary | ICD-10-CM | POA: Diagnosis not present

## 2024-02-03 DIAGNOSIS — R0602 Shortness of breath: Secondary | ICD-10-CM | POA: Diagnosis not present

## 2024-02-03 DIAGNOSIS — R9389 Abnormal findings on diagnostic imaging of other specified body structures: Secondary | ICD-10-CM | POA: Diagnosis not present

## 2024-02-03 DIAGNOSIS — J439 Emphysema, unspecified: Secondary | ICD-10-CM | POA: Diagnosis not present

## 2024-02-03 DIAGNOSIS — R918 Other nonspecific abnormal finding of lung field: Secondary | ICD-10-CM | POA: Diagnosis not present

## 2024-02-12 DIAGNOSIS — J9611 Chronic respiratory failure with hypoxia: Secondary | ICD-10-CM | POA: Diagnosis not present

## 2024-02-12 DIAGNOSIS — J449 Chronic obstructive pulmonary disease, unspecified: Secondary | ICD-10-CM | POA: Diagnosis not present

## 2024-02-23 DIAGNOSIS — R140 Abdominal distension (gaseous): Secondary | ICD-10-CM | POA: Diagnosis not present

## 2024-02-23 DIAGNOSIS — R194 Change in bowel habit: Secondary | ICD-10-CM | POA: Diagnosis not present

## 2024-02-23 DIAGNOSIS — R195 Other fecal abnormalities: Secondary | ICD-10-CM | POA: Diagnosis not present

## 2024-02-24 DIAGNOSIS — R194 Change in bowel habit: Secondary | ICD-10-CM | POA: Diagnosis not present

## 2024-02-24 DIAGNOSIS — R140 Abdominal distension (gaseous): Secondary | ICD-10-CM | POA: Diagnosis not present

## 2024-02-25 DIAGNOSIS — J441 Chronic obstructive pulmonary disease with (acute) exacerbation: Secondary | ICD-10-CM | POA: Diagnosis not present

## 2024-02-25 DIAGNOSIS — J9611 Chronic respiratory failure with hypoxia: Secondary | ICD-10-CM | POA: Diagnosis not present

## 2024-03-13 DIAGNOSIS — J432 Centrilobular emphysema: Secondary | ICD-10-CM | POA: Diagnosis not present

## 2024-03-13 DIAGNOSIS — J449 Chronic obstructive pulmonary disease, unspecified: Secondary | ICD-10-CM | POA: Diagnosis not present

## 2024-03-13 DIAGNOSIS — J410 Simple chronic bronchitis: Secondary | ICD-10-CM | POA: Diagnosis not present

## 2024-03-13 DIAGNOSIS — J9611 Chronic respiratory failure with hypoxia: Secondary | ICD-10-CM | POA: Diagnosis not present

## 2024-03-16 ENCOUNTER — Telehealth: Payer: Self-pay | Admitting: Family

## 2024-03-16 DIAGNOSIS — K862 Cyst of pancreas: Secondary | ICD-10-CM

## 2024-03-16 NOTE — Telephone Encounter (Signed)
 Please advise pt that it is time to complete the 2 year follow up MRI of her pancreatic cysts. I have placed the order and she should be contacted about scheduling.

## 2024-03-16 NOTE — Telephone Encounter (Signed)
 LMOM asking for call back. Okay for E2C2 to make Pt aware.

## 2024-03-16 NOTE — Telephone Encounter (Signed)
 Copied from CRM [id number]. Topic: General - Other >> Mar 16, 2024  1:01 PM Burnard DEL wrote: Reason for CRM: Patient returned call to [facility] regarding MRI order. Message was relayed to patient.Patient verbalized understanding.

## 2024-03-27 DIAGNOSIS — K862 Cyst of pancreas: Secondary | ICD-10-CM | POA: Diagnosis not present

## 2024-03-27 DIAGNOSIS — I700 Atherosclerosis of aorta: Secondary | ICD-10-CM | POA: Diagnosis not present

## 2024-03-27 DIAGNOSIS — K760 Fatty (change of) liver, not elsewhere classified: Secondary | ICD-10-CM | POA: Diagnosis not present

## 2024-03-27 DIAGNOSIS — K8689 Other specified diseases of pancreas: Secondary | ICD-10-CM | POA: Diagnosis not present

## 2024-04-13 DIAGNOSIS — J449 Chronic obstructive pulmonary disease, unspecified: Secondary | ICD-10-CM | POA: Diagnosis not present

## 2024-04-13 DIAGNOSIS — J9611 Chronic respiratory failure with hypoxia: Secondary | ICD-10-CM | POA: Diagnosis not present

## 2024-04-14 DIAGNOSIS — B370 Candidal stomatitis: Secondary | ICD-10-CM | POA: Diagnosis not present

## 2024-04-14 DIAGNOSIS — J029 Acute pharyngitis, unspecified: Secondary | ICD-10-CM | POA: Diagnosis not present

## 2024-04-25 DIAGNOSIS — R209 Unspecified disturbances of skin sensation: Secondary | ICD-10-CM | POA: Diagnosis not present

## 2024-05-05 DIAGNOSIS — R911 Solitary pulmonary nodule: Secondary | ICD-10-CM | POA: Diagnosis not present

## 2024-05-05 DIAGNOSIS — R9389 Abnormal findings on diagnostic imaging of other specified body structures: Secondary | ICD-10-CM | POA: Diagnosis not present

## 2024-05-14 DIAGNOSIS — J9611 Chronic respiratory failure with hypoxia: Secondary | ICD-10-CM | POA: Diagnosis not present

## 2024-05-14 DIAGNOSIS — J449 Chronic obstructive pulmonary disease, unspecified: Secondary | ICD-10-CM | POA: Diagnosis not present

## 2024-05-15 DIAGNOSIS — J441 Chronic obstructive pulmonary disease with (acute) exacerbation: Secondary | ICD-10-CM | POA: Diagnosis not present

## 2024-05-15 DIAGNOSIS — J4489 Other specified chronic obstructive pulmonary disease: Secondary | ICD-10-CM | POA: Diagnosis not present

## 2024-05-15 DIAGNOSIS — J9611 Chronic respiratory failure with hypoxia: Secondary | ICD-10-CM | POA: Diagnosis not present

## 2024-05-18 DIAGNOSIS — C44729 Squamous cell carcinoma of skin of left lower limb, including hip: Secondary | ICD-10-CM | POA: Diagnosis not present

## 2024-05-18 DIAGNOSIS — L570 Actinic keratosis: Secondary | ICD-10-CM | POA: Diagnosis not present

## 2024-05-29 DIAGNOSIS — J4489 Other specified chronic obstructive pulmonary disease: Secondary | ICD-10-CM | POA: Diagnosis not present

## 2024-06-04 ENCOUNTER — Other Ambulatory Visit: Payer: Self-pay | Admitting: Family

## 2024-06-04 DIAGNOSIS — F418 Other specified anxiety disorders: Secondary | ICD-10-CM

## 2024-06-05 NOTE — Telephone Encounter (Signed)
 Please contact pt to schedule follow up visit.   Also, she was supposed to have an MRI of her pancreas done last summer. Did she complete this test and if so, where was it completed?

## 2024-06-07 DIAGNOSIS — R5383 Other fatigue: Secondary | ICD-10-CM | POA: Diagnosis not present

## 2024-06-07 DIAGNOSIS — J449 Chronic obstructive pulmonary disease, unspecified: Secondary | ICD-10-CM | POA: Diagnosis not present

## 2024-06-07 DIAGNOSIS — Z0000 Encounter for general adult medical examination without abnormal findings: Secondary | ICD-10-CM | POA: Diagnosis not present

## 2024-06-07 DIAGNOSIS — R252 Cramp and spasm: Secondary | ICD-10-CM | POA: Diagnosis not present

## 2024-06-07 DIAGNOSIS — R062 Wheezing: Secondary | ICD-10-CM | POA: Diagnosis not present

## 2024-06-09 NOTE — Telephone Encounter (Signed)
 Patient reports she is seeing a new primary care at [facility]. Rx was cancelled at [facility].  She reports she did not have MRI last summer and she will mention this to her new provider.

## 2024-06-13 DIAGNOSIS — J449 Chronic obstructive pulmonary disease, unspecified: Secondary | ICD-10-CM | POA: Diagnosis not present

## 2024-06-13 DIAGNOSIS — J9611 Chronic respiratory failure with hypoxia: Secondary | ICD-10-CM | POA: Diagnosis not present

## 2024-06-19 DIAGNOSIS — J9611 Chronic respiratory failure with hypoxia: Secondary | ICD-10-CM | POA: Diagnosis not present

## 2024-06-19 DIAGNOSIS — J4489 Other specified chronic obstructive pulmonary disease: Secondary | ICD-10-CM | POA: Diagnosis not present

## 2024-06-19 DIAGNOSIS — J441 Chronic obstructive pulmonary disease with (acute) exacerbation: Secondary | ICD-10-CM | POA: Diagnosis not present

## 2024-06-20 DIAGNOSIS — J9611 Chronic respiratory failure with hypoxia: Secondary | ICD-10-CM | POA: Diagnosis not present

## 2024-06-20 DIAGNOSIS — J441 Chronic obstructive pulmonary disease with (acute) exacerbation: Secondary | ICD-10-CM | POA: Diagnosis not present

## 2024-06-20 DIAGNOSIS — J4489 Other specified chronic obstructive pulmonary disease: Secondary | ICD-10-CM | POA: Diagnosis not present

## 2024-07-10 DIAGNOSIS — R5383 Other fatigue: Secondary | ICD-10-CM | POA: Diagnosis not present

## 2024-07-10 DIAGNOSIS — Z13220 Encounter for screening for lipoid disorders: Secondary | ICD-10-CM | POA: Diagnosis not present

## 2024-07-10 DIAGNOSIS — R252 Cramp and spasm: Secondary | ICD-10-CM | POA: Diagnosis not present

## 2024-07-14 DIAGNOSIS — J9611 Chronic respiratory failure with hypoxia: Secondary | ICD-10-CM | POA: Diagnosis not present

## 2024-07-17 DIAGNOSIS — J410 Simple chronic bronchitis: Secondary | ICD-10-CM | POA: Diagnosis not present

## 2024-07-17 DIAGNOSIS — J4489 Other specified chronic obstructive pulmonary disease: Secondary | ICD-10-CM | POA: Diagnosis not present

## 2024-07-17 DIAGNOSIS — M7989 Other specified soft tissue disorders: Secondary | ICD-10-CM | POA: Diagnosis not present

## 2024-07-17 DIAGNOSIS — J9611 Chronic respiratory failure with hypoxia: Secondary | ICD-10-CM | POA: Diagnosis not present

## 2024-07-21 DIAGNOSIS — I517 Cardiomegaly: Secondary | ICD-10-CM | POA: Diagnosis not present

## 2024-08-13 DIAGNOSIS — J9611 Chronic respiratory failure with hypoxia: Secondary | ICD-10-CM | POA: Diagnosis not present
